# Patient Record
Sex: Male | Born: 1970 | Race: White | Hispanic: No | Marital: Single | Smoking: Current every day smoker
Health system: Southern US, Community
[De-identification: ages and names within clinical notes are randomized; demographics above are authoritative.]

## PROBLEM LIST (undated history)

## (undated) DIAGNOSIS — L0291 Cutaneous abscess, unspecified: Secondary | ICD-10-CM

## (undated) DIAGNOSIS — F329 Major depressive disorder, single episode, unspecified: Secondary | ICD-10-CM

## (undated) DIAGNOSIS — I639 Cerebral infarction, unspecified: Secondary | ICD-10-CM

## (undated) DIAGNOSIS — K219 Gastro-esophageal reflux disease without esophagitis: Secondary | ICD-10-CM

## (undated) DIAGNOSIS — I38 Endocarditis, valve unspecified: Secondary | ICD-10-CM

## (undated) DIAGNOSIS — R739 Hyperglycemia, unspecified: Secondary | ICD-10-CM

## (undated) DIAGNOSIS — F419 Anxiety disorder, unspecified: Secondary | ICD-10-CM

## (undated) DIAGNOSIS — I10 Essential (primary) hypertension: Secondary | ICD-10-CM

## (undated) DIAGNOSIS — F32A Depression, unspecified: Secondary | ICD-10-CM

## (undated) HISTORY — PX: OTHER SURGICAL HISTORY: SHX169

## (undated) HISTORY — PX: DENTAL SURGERY: SHX609

---

## 2016-10-22 DIAGNOSIS — R739 Hyperglycemia, unspecified: Secondary | ICD-10-CM

## 2016-10-22 HISTORY — DX: Hyperglycemia, unspecified: R73.9

## 2016-11-04 ENCOUNTER — Encounter (HOSPITAL_COMMUNITY): Payer: Self-pay

## 2016-11-04 ENCOUNTER — Emergency Department (HOSPITAL_COMMUNITY): Payer: Self-pay

## 2016-11-04 ENCOUNTER — Inpatient Hospital Stay (HOSPITAL_COMMUNITY)
Admission: EM | Admit: 2016-11-04 | Discharge: 2016-11-11 | DRG: 574 | Disposition: A | Discharge status: Home / Self Care | Payer: Self-pay | Attending: Family Medicine | Admitting: Family Medicine

## 2016-11-04 DIAGNOSIS — K219 Gastro-esophageal reflux disease without esophagitis: Secondary | ICD-10-CM | POA: Diagnosis present

## 2016-11-04 DIAGNOSIS — L02413 Cutaneous abscess of right upper limb: Principal | ICD-10-CM | POA: Diagnosis present

## 2016-11-04 DIAGNOSIS — Z5900 Homelessness unspecified: Secondary | ICD-10-CM

## 2016-11-04 DIAGNOSIS — L0390 Cellulitis, unspecified: Secondary | ICD-10-CM | POA: Diagnosis present

## 2016-11-04 DIAGNOSIS — L02419 Cutaneous abscess of limb, unspecified: Secondary | ICD-10-CM | POA: Diagnosis present

## 2016-11-04 DIAGNOSIS — R739 Hyperglycemia, unspecified: Secondary | ICD-10-CM

## 2016-11-04 DIAGNOSIS — N179 Acute kidney failure, unspecified: Secondary | ICD-10-CM | POA: Diagnosis not present

## 2016-11-04 DIAGNOSIS — I10 Essential (primary) hypertension: Secondary | ICD-10-CM | POA: Diagnosis present

## 2016-11-04 DIAGNOSIS — F1910 Other psychoactive substance abuse, uncomplicated: Secondary | ICD-10-CM | POA: Diagnosis present

## 2016-11-04 DIAGNOSIS — D72829 Elevated white blood cell count, unspecified: Secondary | ICD-10-CM

## 2016-11-04 DIAGNOSIS — R7303 Prediabetes: Secondary | ICD-10-CM | POA: Diagnosis present

## 2016-11-04 DIAGNOSIS — F17210 Nicotine dependence, cigarettes, uncomplicated: Secondary | ICD-10-CM | POA: Diagnosis present

## 2016-11-04 DIAGNOSIS — T79A0XA Compartment syndrome, unspecified, initial encounter: Secondary | ICD-10-CM | POA: Diagnosis present

## 2016-11-04 DIAGNOSIS — T368X5A Adverse effect of other systemic antibiotics, initial encounter: Secondary | ICD-10-CM | POA: Diagnosis not present

## 2016-11-04 DIAGNOSIS — L02414 Cutaneous abscess of left upper limb: Secondary | ICD-10-CM | POA: Diagnosis present

## 2016-11-04 DIAGNOSIS — F1190 Opioid use, unspecified, uncomplicated: Secondary | ICD-10-CM | POA: Diagnosis present

## 2016-11-04 DIAGNOSIS — B999 Unspecified infectious disease: Secondary | ICD-10-CM

## 2016-11-04 HISTORY — DX: Hyperglycemia, unspecified: R73.9

## 2016-11-04 HISTORY — DX: Gastro-esophageal reflux disease without esophagitis: K21.9

## 2016-11-04 HISTORY — DX: Essential (primary) hypertension: I10

## 2016-11-04 LAB — URINALYSIS, ROUTINE W REFLEX MICROSCOPIC
GLUCOSE, UA: NEGATIVE mg/dL
HGB URINE DIPSTICK: NEGATIVE
Ketones, ur: 15 mg/dL — AB
Leukocytes, UA: NEGATIVE
Nitrite: NEGATIVE
PROTEIN: NEGATIVE mg/dL
Specific Gravity, Urine: 1.027 (ref 1.005–1.030)
pH: 6 (ref 5.0–8.0)

## 2016-11-04 LAB — COMPREHENSIVE METABOLIC PANEL
ALBUMIN: 3.4 g/dL — AB (ref 3.5–5.0)
ALK PHOS: 64 U/L (ref 38–126)
ALT: 12 U/L — AB (ref 17–63)
AST: 19 U/L (ref 15–41)
Anion gap: 10 (ref 5–15)
BILIRUBIN TOTAL: 0.8 mg/dL (ref 0.3–1.2)
BUN: 11 mg/dL (ref 6–20)
CALCIUM: 8.5 mg/dL — AB (ref 8.9–10.3)
CO2: 24 mmol/L (ref 22–32)
CREATININE: 0.93 mg/dL (ref 0.61–1.24)
Chloride: 97 mmol/L — ABNORMAL LOW (ref 101–111)
GFR calc Af Amer: >60 mL/min (ref >60)
GLUCOSE: 137 mg/dL — AB (ref 65–99)
POTASSIUM: 3.7 mmol/L (ref 3.5–5.1)
Sodium: 131 mmol/L — ABNORMAL LOW (ref 135–145)
TOTAL PROTEIN: 6.9 g/dL (ref 6.5–8.1)

## 2016-11-04 LAB — CBC WITH DIFFERENTIAL/PLATELET
BASOS ABS: 0.1 10*3/uL (ref 0.0–0.1)
BASOS PCT: 0 %
EOS PCT: 0 %
Eosinophils Absolute: 0.1 10*3/uL (ref 0.0–0.7)
HEMATOCRIT: 34.4 % — AB (ref 39.0–52.0)
Hemoglobin: 11.2 g/dL — ABNORMAL LOW (ref 13.0–17.0)
Lymphocytes Relative: 10 %
Lymphs Abs: 2.3 10*3/uL (ref 0.7–4.0)
MCH: 27.7 pg (ref 26.0–34.0)
MCHC: 32.6 g/dL (ref 30.0–36.0)
MCV: 85.1 fL (ref 78.0–100.0)
MONO ABS: 1.4 10*3/uL — AB (ref 0.1–1.0)
MONOS PCT: 6 %
NEUTROS ABS: 18.3 10*3/uL — AB (ref 1.7–7.7)
Neutrophils Relative %: 84 %
PLATELETS: 475 10*3/uL — AB (ref 150–400)
RBC: 4.04 MIL/uL — ABNORMAL LOW (ref 4.22–5.81)
RDW: 13.3 % (ref 11.5–15.5)
WBC: 22.1 10*3/uL — ABNORMAL HIGH (ref 4.0–10.5)

## 2016-11-04 LAB — I-STAT CG4 LACTIC ACID, ED: LACTIC ACID, VENOUS: 1.03 mmol/L (ref 0.5–1.9)

## 2016-11-04 MED ORDER — ACETAMINOPHEN 325 MG PO TABS
650.0000 mg | ORAL_TABLET | Freq: Once | ORAL | Status: AC | PRN
  Administered 2016-11-04: 650 mg via ORAL

## 2016-11-04 MED ORDER — SODIUM CHLORIDE 0.9 % IV BOLUS (SEPSIS)
1000.0000 mL | Freq: Once | INTRAVENOUS | Status: AC
  Administered 2016-11-05: 1000 mL via INTRAVENOUS

## 2016-11-04 MED ORDER — ACETAMINOPHEN 325 MG PO TABS
ORAL_TABLET | ORAL | Status: AC
  Filled 2016-11-04: qty 2

## 2016-11-04 NOTE — ED Triage Notes (Signed)
Onset 2 days abscess in right AC after injecting heroin.  Upper right arm to right wrist swollen.  Redness noted to upper mid right arm to below elbow.  No drainage noted.  Pt also has abscess from IV heroin on left AC that is draining clear fluid from small yellow area, swollen, and red.

## 2016-11-05 ENCOUNTER — Encounter (HOSPITAL_COMMUNITY)
Admission: EM | Disposition: A | Discharge status: Home / Self Care | Payer: Self-pay | Source: Home / Self Care | Attending: Family Medicine

## 2016-11-05 ENCOUNTER — Inpatient Hospital Stay (HOSPITAL_COMMUNITY): Payer: Self-pay | Admitting: Anesthesiology

## 2016-11-05 ENCOUNTER — Emergency Department (HOSPITAL_COMMUNITY): Payer: Self-pay

## 2016-11-05 ENCOUNTER — Encounter (HOSPITAL_COMMUNITY): Payer: Self-pay | Admitting: Family Medicine

## 2016-11-05 DIAGNOSIS — I10 Essential (primary) hypertension: Secondary | ICD-10-CM

## 2016-11-05 DIAGNOSIS — L02419 Cutaneous abscess of limb, unspecified: Secondary | ICD-10-CM

## 2016-11-05 DIAGNOSIS — L0390 Cellulitis, unspecified: Secondary | ICD-10-CM

## 2016-11-05 DIAGNOSIS — Z5900 Homelessness unspecified: Secondary | ICD-10-CM

## 2016-11-05 DIAGNOSIS — R739 Hyperglycemia, unspecified: Secondary | ICD-10-CM

## 2016-11-05 DIAGNOSIS — F1990 Other psychoactive substance use, unspecified, uncomplicated: Secondary | ICD-10-CM

## 2016-11-05 DIAGNOSIS — D72829 Elevated white blood cell count, unspecified: Secondary | ICD-10-CM

## 2016-11-05 HISTORY — PX: I & D EXTREMITY: SHX5045

## 2016-11-05 LAB — RAPID URINE DRUG SCREEN, HOSP PERFORMED
Amphetamines: POSITIVE — AB
Barbiturates: NOT DETECTED
Benzodiazepines: POSITIVE — AB
Cocaine: NOT DETECTED
Opiates: POSITIVE — AB
Tetrahydrocannabinol: NOT DETECTED

## 2016-11-05 LAB — C-REACTIVE PROTEIN: CRP: 7.2 mg/dL — AB (ref <1.0)

## 2016-11-05 LAB — SEDIMENTATION RATE: SED RATE: 57 mm/h — AB (ref 0–16)

## 2016-11-05 LAB — ETHANOL: Alcohol, Ethyl (B): <5 mg/dL (ref <5)

## 2016-11-05 SURGERY — IRRIGATION AND DEBRIDEMENT EXTREMITY
Anesthesia: General | Site: Arm Upper | Laterality: Bilateral

## 2016-11-05 MED ORDER — SODIUM CHLORIDE 0.9% FLUSH: 3.0000 mL | INTRAVENOUS | Status: DC | PRN

## 2016-11-05 MED ORDER — HYDROMORPHONE HCL 2 MG/ML IJ SOLN
1.0000 mg | Freq: Once | INTRAMUSCULAR | Status: AC
  Administered 2016-11-05: 1 mg via INTRAVENOUS
  Filled 2016-11-05: qty 1

## 2016-11-05 MED ORDER — KETOROLAC TROMETHAMINE 30 MG/ML IJ SOLN
INTRAMUSCULAR | Status: AC
  Filled 2016-11-05: qty 1

## 2016-11-05 MED ORDER — MIDAZOLAM HCL 2 MG/2ML IJ SOLN
INTRAMUSCULAR | Status: AC
  Filled 2016-11-05: qty 2

## 2016-11-05 MED ORDER — FOLIC ACID 1 MG PO TABS
1.0000 mg | ORAL_TABLET | Freq: Every day | ORAL | Status: DC
  Administered 2016-11-05 – 2016-11-11 (×6): 1 mg via ORAL
  Filled 2016-11-05 (×6): qty 1

## 2016-11-05 MED ORDER — PROPOFOL 10 MG/ML IV BOLUS
INTRAVENOUS | Status: AC
  Filled 2016-11-05: qty 20

## 2016-11-05 MED ORDER — HYDROMORPHONE HCL 1 MG/ML IJ SOLN
0.2500 mg | INTRAMUSCULAR | Status: DC | PRN
  Administered 2016-11-05 (×3): 0.5 mg via INTRAVENOUS

## 2016-11-05 MED ORDER — KETOROLAC TROMETHAMINE 30 MG/ML IJ SOLN
30.0000 mg | Freq: Once | INTRAMUSCULAR | Status: AC | PRN
  Administered 2016-11-05: 30 mg via INTRAVENOUS

## 2016-11-05 MED ORDER — LACTATED RINGERS IV SOLN
INTRAVENOUS | Status: DC | PRN
  Administered 2016-11-05: 07:00:00 via INTRAVENOUS

## 2016-11-05 MED ORDER — THIAMINE HCL 100 MG/ML IJ SOLN: 100.0000 mg | Freq: Every day | INTRAMUSCULAR | Status: DC

## 2016-11-05 MED ORDER — SODIUM CHLORIDE 0.9 % IV SOLN: 250.0000 mL | INTRAVENOUS | Status: DC | PRN

## 2016-11-05 MED ORDER — SODIUM CHLORIDE 0.9 % IR SOLN
Status: DC | PRN
  Administered 2016-11-05: 3000 mL

## 2016-11-05 MED ORDER — MIDAZOLAM HCL 2 MG/2ML IJ SOLN
INTRAMUSCULAR | Status: DC | PRN
  Administered 2016-11-05: 2 mg via INTRAVENOUS

## 2016-11-05 MED ORDER — HYDROMORPHONE HCL 2 MG/ML IJ SOLN
INTRAMUSCULAR | Status: AC
  Administered 2016-11-05: 0.5 mg
  Filled 2016-11-05: qty 1

## 2016-11-05 MED ORDER — ONDANSETRON HCL 4 MG/2ML IJ SOLN
INTRAMUSCULAR | Status: AC
  Filled 2016-11-05: qty 2

## 2016-11-05 MED ORDER — OXYCODONE HCL 5 MG PO TABS
5.0000 mg | ORAL_TABLET | ORAL | Status: DC | PRN
  Administered 2016-11-05 – 2016-11-11 (×31): 10 mg via ORAL
  Filled 2016-11-05 (×32): qty 2

## 2016-11-05 MED ORDER — LIDOCAINE HCL (CARDIAC) 20 MG/ML IV SOLN
INTRAVENOUS | Status: DC | PRN
  Administered 2016-11-05: 60 mg via INTRAVENOUS

## 2016-11-05 MED ORDER — SODIUM CHLORIDE 0.9 % IV BOLUS (SEPSIS)
1000.0000 mL | Freq: Once | INTRAVENOUS | Status: AC
  Administered 2016-11-05: 1000 mL via INTRAVENOUS

## 2016-11-05 MED ORDER — FENTANYL CITRATE (PF) 100 MCG/2ML IJ SOLN
INTRAMUSCULAR | Status: AC
  Filled 2016-11-05: qty 4

## 2016-11-05 MED ORDER — VANCOMYCIN HCL IN DEXTROSE 1-5 GM/200ML-% IV SOLN
1000.0000 mg | Freq: Once | INTRAVENOUS | Status: AC
Start: 2016-11-05 — End: 2016-11-05
  Administered 2016-11-05: 1000 mg via INTRAVENOUS
  Filled 2016-11-05: qty 200

## 2016-11-05 MED ORDER — SUCCINYLCHOLINE CHLORIDE 20 MG/ML IJ SOLN
INTRAMUSCULAR | Status: DC | PRN
  Administered 2016-11-05: 100 mg via INTRAVENOUS

## 2016-11-05 MED ORDER — ACETAMINOPHEN 650 MG RE SUPP: 650.0000 mg | Freq: Four times a day (QID) | RECTAL | Status: DC | PRN

## 2016-11-05 MED ORDER — HYDRALAZINE HCL 20 MG/ML IJ SOLN
5.0000 mg | INTRAMUSCULAR | Status: DC | PRN
  Administered 2016-11-06: 5 mg via INTRAVENOUS

## 2016-11-05 MED ORDER — LORAZEPAM 2 MG/ML IJ SOLN: 1.0000 mg | Freq: Four times a day (QID) | INTRAMUSCULAR | Status: DC | PRN

## 2016-11-05 MED ORDER — MORPHINE SULFATE (PF) 2 MG/ML IV SOLN
1.0000 mg | INTRAVENOUS | Status: DC | PRN
  Administered 2016-11-06: 2 mg via INTRAVENOUS
  Filled 2016-11-05: qty 1

## 2016-11-05 MED ORDER — VANCOMYCIN HCL IN DEXTROSE 1-5 GM/200ML-% IV SOLN
1000.0000 mg | Freq: Two times a day (BID) | INTRAVENOUS | Status: DC
  Administered 2016-11-05 – 2016-11-08 (×6): 1000 mg via INTRAVENOUS
  Filled 2016-11-05 (×8): qty 200

## 2016-11-05 MED ORDER — HYDROMORPHONE HCL 2 MG/ML IJ SOLN: 1.0000 mg | INTRAMUSCULAR | Status: DC | PRN

## 2016-11-05 MED ORDER — PROPOFOL 10 MG/ML IV BOLUS
INTRAVENOUS | Status: DC | PRN
  Administered 2016-11-05: 190 mg via INTRAVENOUS

## 2016-11-05 MED ORDER — 0.9 % SODIUM CHLORIDE (POUR BTL) OPTIME
TOPICAL | Status: DC | PRN
  Administered 2016-11-05: 1000 mL

## 2016-11-05 MED ORDER — ADULT MULTIVITAMIN W/MINERALS CH
1.0000 | ORAL_TABLET | Freq: Every day | ORAL | Status: DC
  Administered 2016-11-05 – 2016-11-11 (×6): 1 via ORAL
  Filled 2016-11-05 (×6): qty 1

## 2016-11-05 MED ORDER — ONDANSETRON HCL 4 MG/2ML IJ SOLN: 4.0000 mg | Freq: Four times a day (QID) | INTRAMUSCULAR | Status: DC | PRN

## 2016-11-05 MED ORDER — PIPERACILLIN-TAZOBACTAM 3.375 G IVPB 30 MIN
3.3750 g | Freq: Once | INTRAVENOUS | Status: AC
  Administered 2016-11-05: 3.375 g via INTRAVENOUS
  Filled 2016-11-05: qty 50

## 2016-11-05 MED ORDER — FENTANYL CITRATE (PF) 100 MCG/2ML IJ SOLN
INTRAMUSCULAR | Status: DC | PRN
  Administered 2016-11-05 (×4): 50 ug via INTRAVENOUS

## 2016-11-05 MED ORDER — LIDOCAINE-EPINEPHRINE (PF) 2 %-1:200000 IJ SOLN
20.0000 mL | Freq: Once | INTRAMUSCULAR | Status: AC
  Administered 2016-11-05: 20 mL
  Filled 2016-11-05: qty 20

## 2016-11-05 MED ORDER — ONDANSETRON HCL 4 MG/2ML IJ SOLN: 4.0000 mg | Freq: Once | INTRAMUSCULAR | Status: DC

## 2016-11-05 MED ORDER — LORAZEPAM 1 MG PO TABS: 1.0000 mg | ORAL_TABLET | Freq: Four times a day (QID) | ORAL | Status: DC | PRN

## 2016-11-05 MED ORDER — PROMETHAZINE HCL 25 MG/ML IJ SOLN: 6.2500 mg | INTRAMUSCULAR | Status: DC | PRN

## 2016-11-05 MED ORDER — ONDANSETRON HCL 4 MG/2ML IJ SOLN
INTRAMUSCULAR | Status: DC | PRN
  Administered 2016-11-05: 4 mg via INTRAVENOUS

## 2016-11-05 MED ORDER — LIDOCAINE 2% (20 MG/ML) 5 ML SYRINGE
INTRAMUSCULAR | Status: AC
  Filled 2016-11-05: qty 5

## 2016-11-05 MED ORDER — PIPERACILLIN-TAZOBACTAM 3.375 G IVPB
3.3750 g | Freq: Three times a day (TID) | INTRAVENOUS | Status: DC
  Administered 2016-11-05 – 2016-11-11 (×17): 3.375 g via INTRAVENOUS
  Filled 2016-11-05 (×21): qty 50

## 2016-11-05 MED ORDER — IOPAMIDOL (ISOVUE-300) INJECTION 61%
INTRAVENOUS | Status: AC
  Administered 2016-11-05: 75 mL via INTRAVENOUS
  Filled 2016-11-05: qty 75

## 2016-11-05 MED ORDER — ONDANSETRON HCL 4 MG PO TABS: 4.0000 mg | ORAL_TABLET | Freq: Four times a day (QID) | ORAL | Status: DC | PRN

## 2016-11-05 MED ORDER — SODIUM CHLORIDE 0.9% FLUSH
3.0000 mL | Freq: Two times a day (BID) | INTRAVENOUS | Status: DC
  Administered 2016-11-06 – 2016-11-11 (×7): 3 mL via INTRAVENOUS

## 2016-11-05 MED ORDER — ACETAMINOPHEN 325 MG PO TABS
650.0000 mg | ORAL_TABLET | Freq: Four times a day (QID) | ORAL | Status: DC | PRN
  Administered 2016-11-05 – 2016-11-07 (×2): 650 mg via ORAL
  Filled 2016-11-05 (×2): qty 2

## 2016-11-05 MED ORDER — VITAMIN B-1 100 MG PO TABS
100.0000 mg | ORAL_TABLET | Freq: Every day | ORAL | Status: DC
  Administered 2016-11-05 – 2016-11-11 (×6): 100 mg via ORAL
  Filled 2016-11-05 (×7): qty 1

## 2016-11-05 SURGICAL SUPPLY — 44 items
BANDAGE ACE 4X5 VEL STRL LF (GAUZE/BANDAGES/DRESSINGS) ×12 IMPLANT
BNDG CONFORM 2 STRL LF (GAUZE/BANDAGES/DRESSINGS) IMPLANT
BNDG ESMARK 4X9 LF (GAUZE/BANDAGES/DRESSINGS) ×6 IMPLANT
BNDG GAUZE ELAST 4 BULKY (GAUZE/BANDAGES/DRESSINGS) ×12 IMPLANT
CLIP TI LARGE 6 (CLIP) ×3 IMPLANT
CLIP TI MEDIUM 6 (CLIP) ×3 IMPLANT
CORDS BIPOLAR (ELECTRODE) ×3 IMPLANT
CUFF TOURNIQUET SINGLE 18IN (TOURNIQUET CUFF) ×6 IMPLANT
CUFF TOURNIQUET SINGLE 24IN (TOURNIQUET CUFF) IMPLANT
DRAIN PENROSE 1/4X12 LTX STRL (WOUND CARE) ×6 IMPLANT
DRSG ADAPTIC 3X8 NADH LF (GAUZE/BANDAGES/DRESSINGS) ×6 IMPLANT
GAUZE SPONGE 4X4 12PLY STRL (GAUZE/BANDAGES/DRESSINGS) ×12 IMPLANT
GAUZE XEROFORM 1X8 LF (GAUZE/BANDAGES/DRESSINGS) ×6 IMPLANT
GLOVE BIOGEL M 8.0 STRL (GLOVE) ×3 IMPLANT
GLOVE SS BIOGEL STRL SZ 8 (GLOVE) ×1 IMPLANT
GLOVE SUPERSENSE BIOGEL SZ 8 (GLOVE) ×2
GOWN STRL REUS W/ TWL LRG LVL3 (GOWN DISPOSABLE) ×1 IMPLANT
GOWN STRL REUS W/ TWL XL LVL3 (GOWN DISPOSABLE) ×2 IMPLANT
GOWN STRL REUS W/TWL LRG LVL3 (GOWN DISPOSABLE) ×2
GOWN STRL REUS W/TWL XL LVL3 (GOWN DISPOSABLE) ×4
HANDPIECE INTERPULSE COAX TIP (DISPOSABLE)
KIT BASIN OR (CUSTOM PROCEDURE TRAY) ×3 IMPLANT
KIT ROOM TURNOVER OR (KITS) ×3 IMPLANT
MANIFOLD NEPTUNE II (INSTRUMENTS) ×3 IMPLANT
NEEDLE HYPO 25GX1X1/2 BEV (NEEDLE) IMPLANT
NS IRRIG 1000ML POUR BTL (IV SOLUTION) ×3 IMPLANT
PACK ORTHO EXTREMITY (CUSTOM PROCEDURE TRAY) ×3 IMPLANT
PAD ARMBOARD 7.5X6 YLW CONV (MISCELLANEOUS) ×3 IMPLANT
PAD CAST 4YDX4 CTTN HI CHSV (CAST SUPPLIES) ×5 IMPLANT
PADDING CAST COTTON 4X4 STRL (CAST SUPPLIES) ×10
SET CYSTO W/LG BORE CLAMP LF (SET/KITS/TRAYS/PACK) ×3 IMPLANT
SET HNDPC FAN SPRY TIP SCT (DISPOSABLE) IMPLANT
SPONGE LAP 18X18 X RAY DECT (DISPOSABLE) ×3 IMPLANT
SPONGE LAP 4X18 X RAY DECT (DISPOSABLE) ×3 IMPLANT
SUT PROLENE 3 0 PS 1 (SUTURE) ×6 IMPLANT
SWAB CULTURE ESWAB REG 1ML (MISCELLANEOUS) ×6 IMPLANT
SYR CONTROL 10ML LL (SYRINGE) IMPLANT
TOWEL OR 17X24 6PK STRL BLUE (TOWEL DISPOSABLE) ×3 IMPLANT
TOWEL OR 17X26 10 PK STRL BLUE (TOWEL DISPOSABLE) ×3 IMPLANT
TUBE ANAEROBIC SPECIMEN COL (MISCELLANEOUS) ×6 IMPLANT
TUBE CONNECTING 12'X1/4 (SUCTIONS) ×1
TUBE CONNECTING 12X1/4 (SUCTIONS) ×2 IMPLANT
WATER STERILE IRR 1000ML POUR (IV SOLUTION) ×3 IMPLANT
YANKAUER SUCT BULB TIP NO VENT (SUCTIONS) ×3 IMPLANT

## 2016-11-05 NOTE — ED Notes (Signed)
Pt O2 sat at 85% on Ra. Placed pt on 2L [place] now sat at 95%

## 2016-11-05 NOTE — ED Provider Notes (Signed)
[facility]-EMERGENCY DEPT Provider Note   CSN: [id number] Arrival date & time: 11/04/16  1910  History   Chief Complaint Chief Complaint  Patient presents with  . Abscess    HPI Jesus Watkins is a 45 y.o. male.  HPI   Patient has PMH of heroine user, uses almost daily for abscesses to bilateral AC's. He states that the left abscess has been therefore a few days but is open and draining. The abscess to his right [facility] started yesterday and has been rapidly worsening. He is having significant pain and swelling. He last used 20 hours ago, and reports having withdrawal symptoms. He has not had much to eat of drink today and has been nauseous and feeling poorly. Denies being aware of fever at home, 101.6 in triage, with a pulse of 110. Pt appears sick and uncomfortable, vital signs have improved after Tylenol.  Pt states he believes it is the water source that caused his abscesses.  Past Medical History:  Diagnosis Date  . Acid reflux     Patient Active Problem List   Diagnosis Date Noted  . Abscess of antecubital fossa 11/05/2016    History reviewed. No pertinent surgical history.    Home Medications    Prior to Admission medications   Not on File    Family History History reviewed. No pertinent family history.  Social History Social History  Substance Use Topics  . Smoking status: Current Every Day Smoker    Packs/day: 0.50    Types: Cigarettes  . Smokeless tobacco: Never Used  . Alcohol use No     Allergies   Patient has no known allergies.   Review of Systems Review of Systems  Review of Systems All other systems negative except as documented in the HPI. All pertinent positives and negatives as reviewed in the HPI.  Physical Exam Updated Vital Signs BP 145/72   Pulse 92   Temp 98.1 F (36.7 C) (Oral)   Resp 20   SpO2 100%   Physical Exam  Constitutional: He appears well-developed and well-nourished. He appears distressed (pain).  HENT:  Head:  Normocephalic and atraumatic.  Nose: Nose normal.  Mouth/Throat: Uvula is midline, oropharynx is clear and moist and mucous membranes are normal.  Dry mucous membranes  Eyes: Pupils are equal, round, and reactive to light.  Neck: Normal range of motion. Neck supple.  Cardiovascular: Normal rate and regular rhythm.   Pulmonary/Chest: Effort normal.  Abdominal: Soft.  No signs of abdominal distention  Musculoskeletal:  No LE swelling  Neurological: He is alert.  Acting at baseline  Skin: Skin is warm and dry. No rash noted.  Left arm- 3 x 2 cm abscess to the antecubital fossa that is open and draining. Wound appears loculated. No associated cellulitis.  Right arm.- large amount of swelling to upper and lower portions of arm with circumferential cellulitis. Arm is firm, indurated, associated crepitus, very tender to the touch.   Nursing note and vitals reviewed.    ED Treatments / Results  Labs (all labs ordered are listed, but only abnormal results are displayed) Labs Reviewed  COMPREHENSIVE METABOLIC PANEL - Abnormal; Notable for the following:       Result Value   Sodium 131 (*)    Chloride 97 (*)    Glucose, Bld 137 (*)    Calcium 8.5 (*)    Albumin 3.4 (*)    ALT 12 (*)    All other components within normal limits  URINALYSIS, ROUTINE W REFLEX  MICROSCOPIC (NOT AT [facility]) - Abnormal; Notable for the following:    Color, Urine AMBER (*)    Bilirubin Urine SMALL (*)    Ketones, ur 15 (*)    All other components within normal limits  CBC WITH DIFFERENTIAL/PLATELET - Abnormal; Notable for the following:    WBC 22.1 (*)    RBC 4.04 (*)    Hemoglobin 11.2 (*)    HCT 34.4 (*)    Platelets 475 (*)    Neutro Abs 18.3 (*)    Monocytes Absolute 1.4 (*)    All other components within normal limits  SEDIMENTATION RATE - Abnormal; Notable for the following:    Sed Rate 57 (*)    All other components within normal limits  C-REACTIVE PROTEIN - Abnormal; Notable for the following:      CRP 7.2 (*)    All other components within normal limits  CULTURE, BLOOD (ROUTINE X 2)  CULTURE, BLOOD (ROUTINE X 2)  URINE CULTURE  I-STAT CG4 LACTIC ACID, ED  I-STAT CG4 LACTIC ACID, ED    EKG  EKG Interpretation None       Radiology Dg Chest 2 View  Result Date: 11/04/2016 CLINICAL DATA:  Concern for infection. Fever, with bilateral forearm abscesses. Initial encounter. EXAM: CHEST  2 VIEW COMPARISON:  None. FINDINGS: The lungs are well-aerated. Mild peribronchial thickening is noted. There is no evidence of focal opacification, pleural effusion or pneumothorax. The heart is normal in size; the mediastinal contour is within normal limits. No acute osseous abnormalities are seen. There is mild chronic deformity of the left lateral seventh rib. IMPRESSION: Mild peribronchial thickening noted.  Lungs otherwise clear. Electronically Signed   By: Roanna RaiderJeffery  Chang M.D.   On: 11/04/2016 21:23    Procedures Procedures (including critical care time)  Medications Ordered in ED Medications  ondansetron (ZOFRAN) injection 4 mg (4 mg Intravenous Not Given 11/05/16 0241)  HYDROmorphone (DILAUDID) injection 1 mg (not administered)  acetaminophen (TYLENOL) tablet 650 mg (650 mg Oral Given 11/04/16 1940)  sodium chloride 0.9 % bolus 1,000 mL (1,000 mLs Intravenous New Bag/Given 11/05/16 0300)  sodium chloride 0.9 % bolus 1,000 mL (0 mLs Intravenous Stopped 11/05/16 0354)  vancomycin (VANCOCIN) IVPB 1000 mg/200 mL premix (0 mg Intravenous Stopped 11/05/16 0336)  iopamidol (ISOVUE-300) 61 % injection (75 mLs Intravenous Contrast Given 11/05/16 0158)  HYDROmorphone (DILAUDID) injection 1 mg (1 mg Intravenous Given 11/05/16 0254)  lidocaine-EPINEPHrine (XYLOCAINE W/EPI) 2 %-1:200000 (PF) injection 20 mL (20 mLs Other Given 11/05/16 0354)  HYDROmorphone (DILAUDID) injection 1 mg (1 mg Intravenous Given 11/05/16 0411)     Initial Impression / Assessment and Plan / ED Course  I have reviewed  the triage vital signs and the nursing notes.  Pertinent labs & imaging results that were available during my care of the patient were reviewed by me and considered in my medical decision making (see chart for details).  Clinical Course     Patient has an elevated CRP at 7.2, and a SED rate of 57. Blood cultures drawn, neg lactic acid. WBC is 22,000. Patient withdrawing, symptoms of withdrawal and pain improved with IV Dilaudid  CT of right antecub shows large complex and multiloculated abscess at the antecubital fossa approx 5 cm with diffuse soft tissue injury.  Wound I&D in ED, left and right, and pt started on Vancomycin, [facility] Dr. Toniann FailKakrakandy, inpatient, Tele.  5:15 am I spoke with hand Dr. Amanda PeaGramig regarding obtaining consult in the AM to assess  if pt needs tot be taken to OR for a wash out.   Final Clinical Impressions(s) / ED Diagnoses   Final diagnoses:  Infection  Abscess of antecubital fossa  Cellulitis, unspecified cellulitis site    New Prescriptions New Prescriptions   No medications on file     Marlon Peliffany Kyriakos Babler, PA-C 11/05/16 0515    Gilda Creasehristopher J Pollina, MD 11/06/16 [phone]

## 2016-11-05 NOTE — H&P (Signed)
Jesus Watkins is an 45 y.o. male.   Chief Complaint: Bilateral arm infections HPI: Bilateral arm infections  I was to see by the emergency room staff and the hospitalist in regards to the [facility] predicament. The patient has a long-standing history of IV drug abuse but recently went on a binge after a where he did not use.  He is currently homeless. He has quite a bit of psychosocial issues ongoing.  He denies other complaints but states that he is quite painful on both arms. I reviewed his chart and the chart reflects what is gone on since his admission.  Past Medical History:  Diagnosis Date  . Acid reflux     History reviewed. No pertinent surgical history.  History reviewed. No pertinent family history. Social History:  reports that he has been smoking Cigarettes.  He has been smoking about 0.50 packs per day. He has never used smokeless tobacco. He reports that he has current or past drug history, including IV. He reports that he does not drink alcohol.  Allergies: No Known Allergies   (Not in a hospital admission)  Results for orders placed or performed during the hospital encounter of 11/04/16 (from the past 48 hour(s))  Comprehensive metabolic panel     Status: Abnormal   Collection Time: 11/04/16  7:45 PM  Result Value Ref Range   Sodium 131 (L) 135 - 145 mmol/L   Potassium 3.7 3.5 - 5.1 mmol/L   Chloride 97 (L) 101 - 111 mmol/L   CO2 24 22 - 32 mmol/L   Glucose, Bld 137 (H) 65 - 99 mg/dL   BUN 11 6 - 20 mg/dL   Creatinine, Ser 0.93 0.61 - 1.24 mg/dL   Calcium 8.5 (L) 8.9 - 10.3 mg/dL   Total Protein 6.9 6.5 - 8.1 g/dL   Albumin 3.4 (L) 3.5 - 5.0 g/dL   AST 19 15 - 41 U/L   ALT 12 (L) 17 - 63 U/L   Alkaline Phosphatase 64 38 - 126 U/L   Total Bilirubin 0.8 0.3 - 1.2 mg/dL   GFR calc non Af Amer >60 >60 mL/min   GFR calc Af Amer >60 >60 mL/min    Comment: (NOTE) The eGFR has been calculated using the CKD EPI equation. This calculation has not been validated in all  clinical situations. eGFR's persistently <60 mL/min signify possible Chronic Kidney Disease.    Anion gap 10 5 - 15  CBC with Differential     Status: Abnormal   Collection Time: 11/04/16  7:45 PM  Result Value Ref Range   WBC 22.1 (H) 4.0 - 10.5 K/uL   RBC 4.04 (L) 4.22 - 5.81 MIL/uL   Hemoglobin 11.2 (L) 13.0 - 17.0 g/dL   HCT 34.4 (L) 39.0 - 52.0 %   MCV 85.1 78.0 - 100.0 fL   MCH 27.7 26.0 - 34.0 pg   MCHC 32.6 30.0 - 36.0 g/dL   RDW 13.3 11.5 - 15.5 %   Platelets 475 (H) 150 - 400 K/uL   Neutrophils Relative % 84 %   Neutro Abs 18.3 (H) 1.7 - 7.7 K/uL   Lymphocytes Relative 10 %   Lymphs Abs 2.3 0.7 - 4.0 K/uL   Monocytes Relative 6 %   Monocytes Absolute 1.4 (H) 0.1 - 1.0 K/uL   Eosinophils Relative 0 %   Eosinophils Absolute 0.1 0.0 - 0.7 K/uL   Basophils Relative 0 %   Basophils Absolute 0.1 0.0 - 0.1 K/uL  Urinalysis, Routine w reflex  microscopic     Status: Abnormal   Collection Time: 11/04/16  7:47 PM  Result Value Ref Range   Color, Urine AMBER (A) YELLOW    Comment: BIOCHEMICALS MAY BE AFFECTED BY COLOR   APPearance CLEAR CLEAR   Specific Gravity, Urine 1.027 1.005 - 1.030   pH 6.0 5.0 - 8.0   Glucose, UA NEGATIVE NEGATIVE mg/dL   Hgb urine dipstick NEGATIVE NEGATIVE   Bilirubin Urine SMALL (A) NEGATIVE   Ketones, ur 15 (A) NEGATIVE mg/dL   Protein, ur NEGATIVE NEGATIVE mg/dL   Nitrite NEGATIVE NEGATIVE   Leukocytes, UA NEGATIVE NEGATIVE    Comment: MICROSCOPIC NOT DONE ON URINES WITH NEGATIVE PROTEIN, BLOOD, LEUKOCYTES, NITRITE, OR GLUCOSE <1000 mg/dL.  I-Stat CG4 Lactic Acid, ED     Status: None   Collection Time: 11/04/16  8:01 PM  Result Value Ref Range   Lactic Acid, Venous 1.03 0.5 - 1.9 mmol/L  Sedimentation rate     Status: Abnormal   Collection Time: 11/05/16 12:57 AM  Result Value Ref Range   Sed Rate 57 (H) 0 - 16 mm/hr  C-reactive protein     Status: Abnormal   Collection Time: 11/05/16 12:57 AM  Result Value Ref Range   CRP 7.2 (H) <1.0  mg/dL   Dg Chest 2 View  Result Date: 11/04/2016 CLINICAL DATA:  Concern for infection. Fever, with bilateral forearm abscesses. Initial encounter. EXAM: CHEST  2 VIEW COMPARISON:  None. FINDINGS: The lungs are well-aerated. Mild peribronchial thickening is noted. There is no evidence of focal opacification, pleural effusion or pneumothorax. The heart is normal in size; the mediastinal contour is within normal limits. No acute osseous abnormalities are seen. There is mild chronic deformity of the left lateral seventh rib. IMPRESSION: Mild peribronchial thickening noted.  Lungs otherwise clear. Electronically Signed   By: Garald Balding M.D.   On: 11/04/2016 21:23   Ct [place] Right W Contrast  Result Date: 11/05/2016 CLINICAL DATA:  Acute onset of right arm infection. Assess for abscess. EXAM: CT OF THE RIGHT ELBOW WITH CONTRAST TECHNIQUE: Multidetector CT imaging was performed following the standard protocol during bolus administration of intravenous contrast. CONTRAST:  75 mL ISOVUE-300 IOPAMIDOL (ISOVUE-300) INJECTION 61% COMPARISON:  None. FINDINGS: There is a large complex multiloculated abscess at the antecubital fossa, measuring approximately 4.8 x 4.7 x 3.6 cm. Mild diffuse soft tissue edema is noted involving the visualized distal upper arm and proximal forearm. The visualized vasculature is grossly unremarkable, though the abscess obscures evaluation of venous vasculature in this region. Soft tissue edema tracks about the musculature. No acute osseous abnormalities are seen. There is no evidence of osseous erosion. No significant elbow joint effusion is identified. IMPRESSION: 1. Large complex multiloculated abscess at the antecubital fossa, measuring 4.8 x 4.7 x 3.6 cm. 2. Mild diffuse soft tissue edema involving the visualized distal upper arm and proximal forearm. Electronically Signed   By: Garald Balding M.D.   On: 11/05/2016 02:54    Review of Systems  Constitutional: Negative.   Eyes:  Negative.   Cardiovascular: Negative.  Negative for PND.  Gastrointestinal: Negative.   Genitourinary: Negative.   Skin: Negative.     Blood pressure 170/72, pulse 86, temperature 99.9 F (37.7 C), temperature source Oral, resp. rate 20, SpO2 100 %. Physical Exam  Bilateral upper stem examination reveals bilateral hair 1 tracks about both arms. He has ascending erythema. He has set to 3 mm marks on the right arm where an initial debridement occurred  by the emergency room staff.  The a patient still has a fluid aggressing from this area on palpation. It is a infectious type fluid. I discussed with patient these issues. His forearm is tense but he can move the fingers. He does certainly have at risk signs regarding the active infection. His left arm is less impressive with erythema and a localized area approximately 4 cm in diameter that has a abscess feature. The patient and I discussed this at length.  I recommend formal irrigation and debridement.  The patient is alert and oriented in no acute distress. The patient complains of pain in the affected upper extremity.  The patient is noted to have a normal HEENT exam. Lung fields show equal chest expansion and no shortness of breath. Abdomen exam is nontender without distention. Lower extremity examination does not show any fracture dislocation or blood clot symptoms. Pelvis is stable and the neck and back are stable and nontender.  He is alert oriented and I discussed with him the necessary and that concerning issues. Assessment/Plan Bilateral abscesses right upper extremities in the antecubital fossa region  IV drug abuse  Homeless psychosocial situation  I discussed all issues with the patient. I been task to evaluate the arms after the initial irrigation debridement type procedure.  I discussed with patient that unfortunately I do not feel that we have a good path going forward and that he needs a surgical idea both arms. The  right arm is especially at risk. He has tight forearm architecture and has ascending erythema. I would recommend a formal irrigation debridement evaluation of his antecubital fossa venous system and fasciotomy repair reconstruction. I discussed with him our experience with IV heroin abscesses and the issues as well as challenges involved. With this amount will go to the OR soon as humanly possible  We are planning surgery for your upper extremity. The risk and benefits of surgery to include risk of bleeding, infection, anesthesia,  damage to normal structures and failure of the surgery to accomplish its intended goals of relieving symptoms and restoring function have been discussed in detail. With this in mind we plan to proceed. I have specifically discussed with the patient the pre-and postoperative regime and the dos and don'ts and risk and benefits in great detail. Risk and benefits of surgery also include risk of dystrophy(CRPS), chronic nerve pain, failure of the healing process to go onto completion and other inherent risks of surgery The relavent the pathophysiology of the disease/injury process, as well as the alternatives for treatment and postoperative course of action has been discussed in great detail with the patient who desires to proceed.  We will do everything in our power to help you (the patient) restore function to the upper extremity. It is a pleasure to see this patient today.  Paulene Floor, MD 11/05/2016, 6:00 AM

## 2016-11-05 NOTE — Anesthesia Preprocedure Evaluation (Signed)
Anesthesia Evaluation  Patient identified by MRN, date of birth, ID band Patient awake    Reviewed: Allergy & Precautions, NPO status , Patient's Chart, lab work & pertinent test results  Airway Mallampati: II  TM Distance: >3 FB Neck ROM: Full    Dental no notable dental hx.    Pulmonary neg pulmonary ROS, Current Smoker,    Pulmonary exam normal breath sounds clear to auscultation       Cardiovascular negative cardio ROS Normal cardiovascular exam Rhythm:Regular Rate:Normal     Neuro/Psych negative neurological ROS  negative psych ROS   GI/Hepatic negative GI ROS, (+)     substance abuse  IV drug use,   Endo/Other  negative endocrine ROS  Renal/GU negative Renal ROS  negative genitourinary   Musculoskeletal negative musculoskeletal ROS (+)   Abdominal   Peds negative pediatric ROS (+)  Hematology negative hematology ROS (+)   Anesthesia Other Findings   Reproductive/Obstetrics negative OB ROS                             Anesthesia Physical Anesthesia Plan  ASA: III  Anesthesia Plan: General   Post-op Pain Management:    Induction: Intravenous  Airway Management Planned: Oral ETT  Additional Equipment:   Intra-op Plan:   Post-operative Plan: Extubation in OR  Informed Consent: I have reviewed the patients History and Physical, chart, labs and discussed the procedure including the risks, benefits and alternatives for the proposed anesthesia with the patient or authorized representative who has indicated his/her understanding and acceptance.   Dental advisory given  Plan Discussed with: CRNA and Surgeon  Anesthesia Plan Comments:         Anesthesia Quick Evaluation

## 2016-11-05 NOTE — Op Note (Signed)
NAME:  Jesus Watkins, Jesus Watkins                ACCOUNT NO.:  [id number]  MEDICAL RECORD NO.:  [id number]  LOCATION:  [facility]                         FACILITY:  [facility]  PHYSICIAN:  Dionne AnoWilliam M. 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 This patient also has a similar process in the left upper extremity.  I have counseled him in regard to risks and benefits of surgery and discussed with him that I would recommend surgical intervention promptly.  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 This patient also has a similar process in the left upper extremity.  I have counseled him in regard to risks and benefits of surgery and discussed with him that I would recommend surgical intervention promptly.  PROCEDURE: 1. Right arm irrigation, debridement, and evacuation of a deep     abscess. 2. Resection portions of basilic and cephalic vein secondary to clot     and infiltrative heroin substance. 3. Fasciotomy, upper arm and forearm. 4. Brachial artery and median nerve dissection (median nerve     neurolysis extensive and brachial artery exploration). 5. Left elbow I and D, deep abscess, this was irrigation and     debridement of skin, subcutaneous tissue, muscle, vessel, and     peritendinous tissue, as well as tendinous tissue. 6. Fasciotomy, left forearm and left upper arm. 7. Tenolysis, tenosynovectomy biceps tendon with infiltrative     infectious process around the tendon requiring a tenolysis. 8. Proximal median nerve release with  lacertus fibrosus resection.  SURGEON:  Dionne AnoWilliam M. Amanda PeaGramig, M.D.  ASSISTANT:  Karie ChimeraBrian Buchanan, PA-C.  COMPLICATIONS:  None.  ANESTHESIA:  General.  TOURNIQUET TIME:  Less than an hour on the right, less than an hour on the left.  DRAINS:  Multiple Penrose drains were placed with wounds left open at the conclusion of the surgery.  INDICATIONS:  This is a 45 year old male with significant and severe psychosocial issues.  He is currently homeless.  He is on heroin binge and has significant infectious findings.  I have discussed with him the risks and benefits, do's and don'ts, timeframe, duration of recovery. With all issues in mind, he desires to proceed.  OPERATION IN DETAIL:  The patient was seen by myself and Anesthesia. Taken to the operative theater and underwent smooth induction of general anesthesia.  I counseled him extensively preoperatively in regard to my concerns and his significant challenges.  Under general anesthetic, time-out was called.  Arm was prepped and draped in usual sterile fashion with Betadine scrub and paint.  Once this was completed, the right arm was addressed first.  Tourniquet was insufflated without exsanguination.  A modified incision was made.  I made a zigzag incision being careful not to cross the elbow crease at the right angle.  After this was completed, I then performed an  evacuation of deep abscess, which was I and D of a deep abscess without difficulty.  The patient tolerated this well.  This included skin, subcutaneous tissue, muscle, tendon, paratenon, and associated structures.  Following this, I then very carefully and cautiously performed a proximal median nerve release, this was a median nerve release and neurolysis releasing the lacertus fibrosus.  I then dissected the cephalic and basilic veins out, which were completely clotted with heroin debris.  I had to use vessel clamps and clamp off the ends and resect the necrotic  devitalized portions.  Following resection of this venous architecture, I turned attention toward the brachial artery.  I dissected this to make sure it was intact and without infiltrative process.  The patient tolerated this well.  Following this, I performed fasciotomy of the volar forearm and the upper arm.  The volar forearm and upper arm underwent a fasciotomy. Muscles were tense, but pink.  There was no necrotic features about the fascia.  The patient tolerated this well.  I then placed 3 L through and through the area.  The patient tolerated this nicely.  The biceps tendon was intact and underwent a brief tenolysis tenosynovectomy.  Following this, I then left the wound wide open.  Multiple Penrose drains were placed.  Soft bandage followed by long-arm splint was applied.  Once this was complete, we turned attention towards the left upper extremity where a modified Brunner zigzag incision was made similar to the right side.  Dissection was carried down.  The deep abscess was evacuated and this included skin, subcutaneous tissue, muscle, tendon, and paratenon tissue.  Cultures were taken on the left side as they were on the right side for standard aerobic and anaerobic cultures.  I should note that the patient had vancomycin preoperatively.  Following this, I then performed fasciotomy of the forearm and upper arm with sliding technique.  Muscle was intact.  Muscle was pink.  Following this, I then performed antebrachial venous architecture and clamped off portions of the basilic vein, which were reddened with heroin infiltrate.  Following resection of the vessel, we then performed a median nerve release with lacertus fibrosus release and evaluation of the median nerve, which looked excellent.  Following this, we irrigated with 3 L of saline.  Following this, we tacked and closed with 2 Prolenes.  The patient tolerated this well. There were no complicating features.  All  sponge, needle, and instrument counts were reported as correct.  Dressing was placed soft on the left side.  He will be admitted by Medicine.  I would recommend vancomycin and Zosyn.  I have discussed with him the relevant issues, do's and don'ts, etc.  He will go back to the operating room on Thursday and he will notify us should any problems occur.  These notes have been discussed.  This is going to be a very significant challenge for this gentleman both on the physical and emotional forefront and we let him know this open and honestly.     Dionne AnoWilliam M. Amanda PeaGramig, M.D.     [facility]WMG/MEDQ  D:  11/05/2016  T:  11/05/2016  Job:  [id number]

## 2016-11-05 NOTE — Op Note (Signed)
Op ZOXW#[id number]#[id number] Amanda PeaGramig Md

## 2016-11-05 NOTE — Anesthesia Preprocedure Evaluation (Signed)
Anesthesia Evaluation  Patient identified by MRN, date of birth, ID band Patient awake    Airway Mallampati: I       Dental   Front teeth intact. Several molars missing. Dental advisory given. :   Pulmonary Current Smoker,    Pulmonary exam normal        Cardiovascular Normal cardiovascular exam     Neuro/Psych    GI/Hepatic GERD  ,Takes baking soda.   Endo/Other    Renal/GU      Musculoskeletal   Abdominal   Peds  Hematology   Anesthesia Other Findings   Reproductive/Obstetrics                             Anesthesia Physical Anesthesia Plan  ASA: II and emergent  Anesthesia Plan: General   Post-op Pain Management:    Induction: Intravenous, Rapid sequence and Cricoid pressure planned  Airway Management Planned: Oral ETT  Additional Equipment:   Intra-op Plan:   Post-operative Plan: Extubation in OR  Informed Consent:   Dental advisory given  Plan Discussed with: CRNA, Anesthesiologist and Surgeon  Anesthesia Plan Comments:         Anesthesia Quick Evaluation

## 2016-11-05 NOTE — Anesthesia Postprocedure Evaluation (Signed)
Anesthesia Post Note  Patient: Jesus Watkins  Procedure(s) Performed: Procedure(s) (LRB): IRRIGATION AND DEBRIDEMENT EXTREMITY (Bilateral)  Patient location during evaluation: PACU Anesthesia Type: General Level of consciousness: sedated Pain management: satisfactory to patient Vital Signs Assessment: post-procedure vital signs reviewed and stable Respiratory status: spontaneous breathing Cardiovascular status: stable Anesthetic complications: no    Last Vitals:  Vitals:   11/05/16 0855 11/05/16 0916  BP: (!) 158/76 (!) 156/78  Pulse: 78 83  Resp: 16 20  Temp: 37.5 C 37 C    Last Pain:  Vitals:   11/05/16 0916  TempSrc: Oral  PainSc: 9                  [name] EDWARD

## 2016-11-05 NOTE — Progress Notes (Signed)
Pharmacy Antibiotic Note  Jesus Watkins is a 45 y.o. male admitted on 11/04/2016 with swollen bilateral arm + wrist. Pt was found to have bilateral abscesses in the antecubital region with compartment syndrome in his R arm. He underwent an I&D, fasciotomy, tenolysis and nerve dissection this morning. WBC 22.1, ESR elevated, Tm 101.6, LA wnl, SCr wnl. He received vancomycin 1 g at 0200 today, this will serve as a low vancomycin load.  Vancomycin goal: 10-15 mcg/ml  Plan: -Zosyn 3.375 g IV q8h -Vancomycin 1g/12h -Monitor renal fx, cultures, duration of therapy, obtain VT at Css  Temp (24hrs), Avg:99.4 F (37.4 C), Min:98.1 F (36.7 C), Max:101.6 F (38.7 C)   Recent Labs Lab 11/04/16 1945 11/04/16 2001  WBC 22.1*  --   CREATININE 0.93  --   LATICACIDVEN  --  1.03    CrCl cannot be calculated (Unknown ideal weight.).    No Known Allergies  Antimicrobials this admission: 11/15 vancomycin > 11/15 zosyn >  Dose adjustments this admission: NA  Microbiology results: 11/15 urine cx: 11/14 blood cx: 11/15 abscess cx:   Thank you for allowing pharmacy to be a part of this patient's care.    Alison Masters, PharmD, BCPS Clinical Pharmacist 11/05/2016 10:23 AM   

## 2016-11-05 NOTE — H&P (Addendum)
History and Physical    Jesus KinScott Nier BJY:[id number] DOB: 07/13/1971 DOA: 11/04/2016  PCP: No PCP Per Patient Patient coming from: homeless  Chief Complaint: bilat arm pain w/ draining L arm wound  HPI: Jesus Watkins is a 45 y.o. male with medical history significant of GERD, and IVDA.   Level 5 caveat applies. Pt continues to be very sleepy postoperatively and unable to provide great details surounding his current condition. History obtained  From pts chart from EDP and orthopedics notes, and on a limited basis by pt. L arm pain. Constant and getting worse. Started over days ago. Developed right arm pain and swelling approximately one day ago. Left arm developed a boil which then opened up and has been using. Right arm abscess is not opened and drained yet. Patient endorses IV drug use, heroin. Last use was approximately 36 hours ago. Does not use sterile technique per patient. At time of my examination patient denied any recent chest pain, palpitations, shortness of breath, abdominal pain, diarrhea, vomiting, dysuria, frequency, neck stiffness, headache, fevers.    ED Course: started on Vanc/Zosyn and taken to OR by Dr. Amanda PeaGramig for washout  Review of Systems: As per HPI otherwise 10 point review of systems negative.   Ambulatory Status:no restrictions at baseline  Past Medical History:  Diagnosis Date  . Acid reflux     Past Surgical History:  Procedure Laterality Date  . bilateral arm abscesses      Social History   Social History  . Marital status: Single    Spouse name: N/A  . Number of children: N/A  . Years of education: N/A   Occupational History  . Not on file.   Social History Main Topics  . Smoking status: Current Every Day Smoker    Packs/day: 0.50    Types: Cigarettes  . Smokeless tobacco: Never Used  . Alcohol use No  . Drug use: Unknown    Types: IV     Comment: HEROIN, LAST USED 11-02-16  . Sexual activity: No   Other Topics Concern  . Not on file    Social History Narrative  . No narrative on file    No Known Allergies  Family History  Problem Relation Age of Onset  . Family history unknown: Yes    Prior to Admission medications   Not on File    Physical Exam: Vitals:   11/05/16 0845 11/05/16 0855 11/05/16 0916 11/05/16 1251  BP: (!) 155/75 (!) 158/76 (!) 156/78   Pulse: 80 78 83   Resp: 14 16 20    Temp:  99.5 F (37.5 C) 98.6 F (37 C)   TempSrc:   Oral   SpO2: 100% 100% 99%   Weight:    72.3 kg (159 lb 6.3 oz)  Height:    5\' 8"  (1.727 m)     General:  Appears calm and comfortable Eyes:  PERRL, EOMI, normal lids, iris ENT:  grossly normal hearing, lips & tongue, mmm Neck:  no LAD, masses or thyromegaly Cardiovascular:  RRR, faint heart sounds, II/VI systolic murmur. No LE edema.  Respiratory:  CTA bilaterally, no w/r/r. Normal respiratory effort. Abdomen:  soft, ntnd, NABS Skin:  no rash or induration seen on limited exam. Bilateral upper extremities wrapped with sterile dressings postoperatively. Clean dry and intact. Musculoskeletal: Good use of upper extremities due to bandaging material across the antecubital fossa was, no bony upper maladies, grossly normal tone Psychiatric:  grossly normal mood and affect, speech fluent and appropriate, AOx3 Neurologic:  CN 2-12 grossly intact, moves all extremities in coordinated fashion, sensation intact  Labs on Admission: I have personally reviewed following labs and imaging studies  CBC:  Recent Labs Lab 11/04/16 1945  WBC 22.1*  NEUTROABS 18.3*  HGB 11.2*  HCT 34.4*  MCV 85.1  PLT 475*   Basic Metabolic Panel:  Recent Labs Lab 11/04/16 1945  NA 131*  K 3.7  CL 97*  CO2 24  GLUCOSE 137*  BUN 11  CREATININE 0.93  CALCIUM 8.5*   GFR: Estimated Creatinine Clearance: 98.1 mL/min (by C-G formula based on SCr of 0.93 mg/dL). Liver Function Tests:  Recent Labs Lab 11/04/16 1945  AST 19  ALT 12*  ALKPHOS 64  BILITOT 0.8  PROT 6.9  ALBUMIN  3.4*   No results for input(s): LIPASE, AMYLASE in the last 168 hours. No results for input(s): AMMONIA in the last 168 hours. Coagulation Profile: No results for input(s): INR, PROTIME in the last 168 hours. Cardiac Enzymes: No results for input(s): CKTOTAL, CKMB, CKMBINDEX, TROPONINI in the last 168 hours. BNP (last 3 results) No results for input(s): PROBNP in the last 8760 hours. HbA1C: No results for input(s): HGBA1C in the last 72 hours. CBG: No results for input(s): GLUCAP in the last 168 hours. Lipid Profile: No results for input(s): CHOL, HDL, LDLCALC, TRIG, CHOLHDL, LDLDIRECT in the last 72 hours. Thyroid Function Tests: No results for input(s): TSH, T4TOTAL, FREET4, T3FREE, THYROIDAB in the last 72 hours. Anemia Panel: No results for input(s): VITAMINB12, FOLATE, FERRITIN, TIBC, IRON, RETICCTPCT in the last 72 hours. Urine analysis:    Component Value Date/Time   COLORURINE AMBER (A) 11/04/2016 1947   APPEARANCEUR CLEAR 11/04/2016 1947   LABSPEC 1.027 11/04/2016 1947   PHURINE 6.0 11/04/2016 1947   GLUCOSEU NEGATIVE 11/04/2016 1947   HGBUR NEGATIVE 11/04/2016 1947   BILIRUBINUR SMALL (A) 11/04/2016 1947   KETONESUR 15 (A) 11/04/2016 1947   PROTEINUR NEGATIVE 11/04/2016 1947   NITRITE NEGATIVE 11/04/2016 1947   LEUKOCYTESUR NEGATIVE 11/04/2016 1947    Creatinine Clearance: Estimated Creatinine Clearance: 98.1 mL/min (by C-G formula based on SCr of 0.93 mg/dL).  Sepsis Labs: @LABRCNTIP (procalcitonin:4,lacticidven:4) )No results found for this or any previous visit (from the past 240 hour(s)).   Radiological Exams on Admission: Dg Chest 2 View  Result Date: 11/04/2016 CLINICAL DATA:  Concern for infection. Fever, with bilateral forearm abscesses. Initial encounter. EXAM: CHEST  2 VIEW COMPARISON:  None. FINDINGS: The lungs are well-aerated. Mild peribronchial thickening is noted. There is no evidence of focal opacification, pleural effusion or pneumothorax. The  heart is normal in size; the mediastinal contour is within normal limits. No acute osseous abnormalities are seen. There is mild chronic deformity of the left lateral seventh rib. IMPRESSION: Mild peribronchial thickening noted.  Lungs otherwise clear. Electronically Signed   By: Roanna Raider M.D.   On: 11/04/2016 21:23   Ct Eblow Right W Contrast  Result Date: 11/05/2016 CLINICAL DATA:  Acute onset of right arm infection. Assess for abscess. EXAM: CT OF THE RIGHT ELBOW WITH CONTRAST TECHNIQUE: Multidetector CT imaging was performed following the standard protocol during bolus administration of intravenous contrast. CONTRAST:  75 mL ISOVUE-300 IOPAMIDOL (ISOVUE-300) INJECTION 61% COMPARISON:  None. FINDINGS: There is a large complex multiloculated abscess at the antecubital fossa, measuring approximately 4.8 x 4.7 x 3.6 cm. Mild diffuse soft tissue edema is noted involving the visualized distal upper arm and proximal forearm. The visualized vasculature is grossly unremarkable, though the abscess obscures evaluation of  venous vasculature in this region. Soft tissue edema tracks about the musculature. No acute osseous abnormalities are seen. There is no evidence of osseous erosion. No significant elbow joint effusion is identified. IMPRESSION: 1. Large complex multiloculated abscess at the antecubital fossa, measuring 4.8 x 4.7 x 3.6 cm. 2. Mild diffuse soft tissue edema involving the visualized distal upper arm and proximal forearm. Electronically Signed   By: Roanna RaiderJeffery  Chang M.D.   On: 11/05/2016 02:54    EKG: Pending  Assessment/Plan Active Problems:   Abscess of antecubital fossa   Hyperglycemia   IVDA (intravenous drug abuse) complicating pregnancy   Leukocytosis   Essential hypertension   Homeless   Cellulitis   Bilat Abscess/Cellulitis: Patient taken to operating room on 11/05/2016 emergently by Dr. Amanda PeaGramig for washout. POD# 0. Continues to be somewhat sleepy from anesthesia - Wound Mgt per  ortho - Dr Amanda PeaGramig - f/u BCX, Wound Cx - HIV, RPR - Continue Vanc/Zosyn - narrow as able   IVDA: endorses binging on IVD w/ daily administration. Heroine appears to be drug of choice. Unknown ETOH intake - UDS - HIV, RPR - pain mgt will need to be monitored closely - CIWA - Echo if BCX +  Hyperglycemia: no h/o DM. 156. Likely secondary to acute stress from infection - CBG monitoring - A1c  HTN: not on any medications. May be elevated secondary to pain - Hydralazine prn  Homeless: - CSW for community resources.     DVT prophylaxis: SCD  Code Status: full  Family Communication: none  Disposition Plan: pending recovery from surgery and continued workup for IVDA and sequelae  Consults called: Ortho - Gramig  Admission status: inpt    MERRELL, DAVID J MD [facility]  If 7PM-7AM, please contact night-coverage [email or web address] Password [email or web address]  11/05/2016, 2:07 PM

## 2016-11-05 NOTE — Anesthesia Procedure Notes (Signed)
Procedure Name: Intubation Date/Time: 11/05/2016 6:46 AM Performed by: Molli HazardGORDON, THERESA M Pre-anesthesia Checklist: Patient identified, Emergency Drugs available, Suction available and Patient being monitored Patient Re-evaluated:Patient Re-evaluated prior to inductionOxygen Delivery Method: Circle system utilized Preoxygenation: Pre-oxygenation with 100% oxygen Intubation Type: IV induction, Cricoid Pressure applied and Rapid sequence Laryngoscope Size: Miller and 2 Grade View: Grade I Tube type: Oral Tube size: 7.5 mm Number of attempts: 1 Airway Equipment and Method: Stylet Placement Confirmation: ETT inserted through vocal cords under direct vision,  positive ETCO2 and breath sounds checked- equal and bilateral Secured at: 22 cm Tube secured with: Tape Dental Injury: Teeth and Oropharynx as per pre-operative assessment

## 2016-11-05 NOTE — Transfer of Care (Signed)
Immediate Anesthesia Transfer of Care Note  Patient: Jesus Watkins  Procedure(s) Performed: Procedure(s): IRRIGATION AND DEBRIDEMENT EXTREMITY (Bilateral)  Patient Location: PACU  Anesthesia Type:General  Level of Consciousness: awake, alert , oriented and patient cooperative  Airway & Oxygen Therapy: Patient Spontanous Breathing and Patient connected to nasal cannula oxygen  Post-op Assessment: Report given to RN, Post -op Vital signs reviewed and stable and Patient moving all extremities  Post vital signs: Reviewed and stable  Last Vitals:  Vitals:   11/05/16 0515 11/05/16 0543  BP: 170/72   Pulse: 86   Resp:    Temp:  37.7 C    Last Pain:  Vitals:   11/05/16 0543  TempSrc: Oral  PainSc:          Complications: No apparent anesthesia complications

## 2016-11-06 ENCOUNTER — Ambulatory Visit: Payer: Self-pay | Admitting: Orthopedic Surgery

## 2016-11-06 ENCOUNTER — Inpatient Hospital Stay (HOSPITAL_COMMUNITY): Payer: Self-pay | Admitting: Anesthesiology

## 2016-11-06 ENCOUNTER — Encounter (HOSPITAL_COMMUNITY)
Admission: EM | Disposition: A | Discharge status: Home / Self Care | Payer: Self-pay | Source: Home / Self Care | Attending: Family Medicine

## 2016-11-06 ENCOUNTER — Encounter (HOSPITAL_COMMUNITY): Payer: Self-pay | Admitting: Orthopedic Surgery

## 2016-11-06 DIAGNOSIS — L03119 Cellulitis of unspecified part of limb: Secondary | ICD-10-CM

## 2016-11-06 HISTORY — PX: I & D EXTREMITY: SHX5045

## 2016-11-06 LAB — BASIC METABOLIC PANEL
ANION GAP: 7 (ref 5–15)
BUN: <5 mg/dL — ABNORMAL LOW (ref 6–20)
CO2: 27 mmol/L (ref 22–32)
Calcium: 7.9 mg/dL — ABNORMAL LOW (ref 8.9–10.3)
Chloride: 102 mmol/L (ref 101–111)
Creatinine, Ser: 0.78 mg/dL (ref 0.61–1.24)
GFR calc Af Amer: >60 mL/min (ref >60)
GFR calc non Af Amer: >60 mL/min (ref >60)
GLUCOSE: 210 mg/dL — AB (ref 65–99)
POTASSIUM: 3.6 mmol/L (ref 3.5–5.1)
Sodium: 136 mmol/L (ref 135–145)

## 2016-11-06 LAB — LIPID PANEL
CHOL/HDL RATIO: 3.7 ratio
Cholesterol: 89 mg/dL (ref 0–200)
HDL: 24 mg/dL — AB (ref >40)
LDL CALC: 50 mg/dL (ref 0–99)
Triglycerides: 76 mg/dL (ref <150)
VLDL: 15 mg/dL (ref 0–40)

## 2016-11-06 LAB — HIV ANTIBODY (ROUTINE TESTING W REFLEX): HIV Screen 4th Generation wRfx: NONREACTIVE

## 2016-11-06 LAB — GLUCOSE, CAPILLARY
GLUCOSE-CAPILLARY: 96 mg/dL (ref 65–99)
GLUCOSE-CAPILLARY: 183 mg/dL — AB (ref 65–99)
Glucose-Capillary: 172 mg/dL — ABNORMAL HIGH (ref 65–99)

## 2016-11-06 LAB — CBC
HEMATOCRIT: 29.5 % — AB (ref 39.0–52.0)
Hemoglobin: 9.4 g/dL — ABNORMAL LOW (ref 13.0–17.0)
MCH: 27.7 pg (ref 26.0–34.0)
MCHC: 31.9 g/dL (ref 30.0–36.0)
MCV: 87 fL (ref 78.0–100.0)
Platelets: 360 10*3/uL (ref 150–400)
RBC: 3.39 MIL/uL — AB (ref 4.22–5.81)
RDW: 14 % (ref 11.5–15.5)
WBC: 13.1 10*3/uL — AB (ref 4.0–10.5)

## 2016-11-06 LAB — URINE CULTURE

## 2016-11-06 LAB — RPR: RPR Ser Ql: NONREACTIVE

## 2016-11-06 SURGERY — IRRIGATION AND DEBRIDEMENT EXTREMITY
Anesthesia: General | Site: Arm Upper | Laterality: Bilateral

## 2016-11-06 MED ORDER — HYDROMORPHONE HCL 1 MG/ML IJ SOLN
0.2500 mg | INTRAMUSCULAR | Status: DC | PRN
  Administered 2016-11-06 (×4): 0.5 mg via INTRAVENOUS

## 2016-11-06 MED ORDER — FENTANYL CITRATE (PF) 100 MCG/2ML IJ SOLN
INTRAMUSCULAR | Status: AC
  Filled 2016-11-06: qty 2

## 2016-11-06 MED ORDER — PROMETHAZINE HCL 25 MG/ML IJ SOLN
6.2500 mg | INTRAMUSCULAR | Status: DC | PRN
  Administered 2016-11-06: 12.5 mg via INTRAVENOUS

## 2016-11-06 MED ORDER — LIDOCAINE 2% (20 MG/ML) 5 ML SYRINGE
INTRAMUSCULAR | Status: AC
  Filled 2016-11-06: qty 5

## 2016-11-06 MED ORDER — FENTANYL CITRATE (PF) 100 MCG/2ML IJ SOLN
INTRAMUSCULAR | Status: DC | PRN
  Administered 2016-11-06: 100 ug via INTRAVENOUS

## 2016-11-06 MED ORDER — PROPOFOL 10 MG/ML IV BOLUS
INTRAVENOUS | Status: DC | PRN
  Administered 2016-11-06: 250 mg via INTRAVENOUS
  Administered 2016-11-06: 50 mg via INTRAVENOUS

## 2016-11-06 MED ORDER — LIDOCAINE 2% (20 MG/ML) 5 ML SYRINGE
INTRAMUSCULAR | Status: DC | PRN
  Administered 2016-11-06: 100 mg via INTRAVENOUS

## 2016-11-06 MED ORDER — PROMETHAZINE HCL 25 MG/ML IJ SOLN
INTRAMUSCULAR | Status: AC
  Administered 2016-11-06: 12.5 mg via INTRAVENOUS
  Filled 2016-11-06: qty 1

## 2016-11-06 MED ORDER — PROPOFOL 10 MG/ML IV BOLUS
INTRAVENOUS | Status: AC
  Filled 2016-11-06: qty 20

## 2016-11-06 MED ORDER — LACTATED RINGERS IV SOLN
INTRAVENOUS | Status: DC | PRN
Start: 2016-11-06 — End: 2016-11-06
  Administered 2016-11-06 (×2): via INTRAVENOUS

## 2016-11-06 MED ORDER — SODIUM CHLORIDE 0.9 % IR SOLN
Status: DC | PRN
  Administered 2016-11-06: 3000 mL

## 2016-11-06 MED ORDER — MIDAZOLAM HCL 2 MG/2ML IJ SOLN
INTRAMUSCULAR | Status: DC | PRN
  Administered 2016-11-06: 2 mg via INTRAVENOUS

## 2016-11-06 MED ORDER — ONDANSETRON HCL 4 MG/2ML IJ SOLN
INTRAMUSCULAR | Status: AC
  Filled 2016-11-06: qty 2

## 2016-11-06 MED ORDER — HYDRALAZINE HCL 20 MG/ML IJ SOLN
INTRAMUSCULAR | Status: AC
  Administered 2016-11-06: 5 mg via INTRAVENOUS
  Filled 2016-11-06: qty 1

## 2016-11-06 MED ORDER — SUCCINYLCHOLINE CHLORIDE 200 MG/10ML IV SOSY
PREFILLED_SYRINGE | INTRAVENOUS | Status: AC
  Filled 2016-11-06: qty 10

## 2016-11-06 MED ORDER — SUCCINYLCHOLINE CHLORIDE 200 MG/10ML IV SOSY
PREFILLED_SYRINGE | INTRAVENOUS | Status: DC | PRN
Start: 2016-11-06 — End: 2016-11-06
  Administered 2016-11-06: 120 mg via INTRAVENOUS

## 2016-11-06 MED ORDER — HYDROMORPHONE HCL 2 MG/ML IJ SOLN
INTRAMUSCULAR | Status: AC
  Administered 2016-11-06: 0.5 mg
  Filled 2016-11-06: qty 1

## 2016-11-06 MED ORDER — MIDAZOLAM HCL 2 MG/2ML IJ SOLN
INTRAMUSCULAR | Status: AC
  Filled 2016-11-06: qty 2

## 2016-11-06 MED ORDER — 0.9 % SODIUM CHLORIDE (POUR BTL) OPTIME
TOPICAL | Status: DC | PRN
  Administered 2016-11-06: 1000 mL

## 2016-11-06 MED ORDER — CHLORHEXIDINE GLUCONATE 4 % EX LIQD
60.0000 mL | Freq: Once | CUTANEOUS | Status: AC
  Administered 2016-11-06: 4 via TOPICAL

## 2016-11-06 MED ORDER — LACTATED RINGERS IV SOLN
INTRAVENOUS | Status: DC
  Administered 2016-11-06: 17:00:00 via INTRAVENOUS

## 2016-11-06 SURGICAL SUPPLY — 47 items
BANDAGE ACE 3X5.8 VEL STRL LF (GAUZE/BANDAGES/DRESSINGS) ×6 IMPLANT
BANDAGE ACE 4X5 VEL STRL LF (GAUZE/BANDAGES/DRESSINGS) ×6 IMPLANT
BNDG CONFORM 2 STRL LF (GAUZE/BANDAGES/DRESSINGS) IMPLANT
BNDG GAUZE ELAST 4 BULKY (GAUZE/BANDAGES/DRESSINGS) ×6 IMPLANT
CORDS BIPOLAR (ELECTRODE) ×3 IMPLANT
CUFF TOURNIQUET SINGLE 18IN (TOURNIQUET CUFF) ×6 IMPLANT
CUFF TOURNIQUET SINGLE 24IN (TOURNIQUET CUFF) IMPLANT
DRAIN PENROSE 1/4X12 LTX STRL (WOUND CARE) ×3 IMPLANT
DRSG ADAPTIC 3X8 NADH LF (GAUZE/BANDAGES/DRESSINGS) ×6 IMPLANT
EVACUATOR 1/8 PVC DRAIN (DRAIN) IMPLANT
FLUID NSS /IRRIG 3000 ML XXX (IV SOLUTION) ×6 IMPLANT
GAUZE SPONGE 4X4 12PLY STRL (GAUZE/BANDAGES/DRESSINGS) ×6 IMPLANT
GAUZE XEROFORM 1X8 LF (GAUZE/BANDAGES/DRESSINGS) ×3 IMPLANT
GAUZE XEROFORM 5X9 LF (GAUZE/BANDAGES/DRESSINGS) ×6 IMPLANT
GLOVE BIOGEL M 8.0 STRL (GLOVE) ×3 IMPLANT
GLOVE SS BIOGEL STRL SZ 8 (GLOVE) ×1 IMPLANT
GLOVE SUPERSENSE BIOGEL SZ 8 (GLOVE) ×2
GOWN STRL REUS W/ TWL LRG LVL3 (GOWN DISPOSABLE) ×1 IMPLANT
GOWN STRL REUS W/ TWL XL LVL3 (GOWN DISPOSABLE) ×2 IMPLANT
GOWN STRL REUS W/TWL LRG LVL3 (GOWN DISPOSABLE) ×2
GOWN STRL REUS W/TWL XL LVL3 (GOWN DISPOSABLE) ×4
HANDPIECE INTERPULSE COAX TIP (DISPOSABLE)
KIT BASIN OR (CUSTOM PROCEDURE TRAY) ×3 IMPLANT
KIT ROOM TURNOVER OR (KITS) ×3 IMPLANT
MANIFOLD NEPTUNE II (INSTRUMENTS) ×3 IMPLANT
NEEDLE HYPO 25GX1X1/2 BEV (NEEDLE) IMPLANT
NS IRRIG 1000ML POUR BTL (IV SOLUTION) ×3 IMPLANT
PACK ORTHO EXTREMITY (CUSTOM PROCEDURE TRAY) ×3 IMPLANT
PAD ARMBOARD 7.5X6 YLW CONV (MISCELLANEOUS) ×3 IMPLANT
PAD CAST 3X4 CTTN HI CHSV (CAST SUPPLIES) ×1 IMPLANT
PAD CAST 4YDX4 CTTN HI CHSV (CAST SUPPLIES) ×1 IMPLANT
PADDING CAST COTTON 3X4 STRL (CAST SUPPLIES) ×2
PADDING CAST COTTON 4X4 STRL (CAST SUPPLIES) ×2
SET CYSTO W/LG BORE CLAMP LF (SET/KITS/TRAYS/PACK) ×3 IMPLANT
SET HNDPC FAN SPRY TIP SCT (DISPOSABLE) IMPLANT
SPLINT FIBERGLASS 4X30 (CAST SUPPLIES) ×3 IMPLANT
SPONGE LAP 4X18 X RAY DECT (DISPOSABLE) ×3 IMPLANT
SUT PROLENE 3 0 PS 1 (SUTURE) ×18 IMPLANT
SUT VIC AB 3-0 FS2 27 (SUTURE) ×12 IMPLANT
SYR CONTROL 10ML LL (SYRINGE) IMPLANT
TOWEL OR 17X24 6PK STRL BLUE (TOWEL DISPOSABLE) ×3 IMPLANT
TOWEL OR 17X26 10 PK STRL BLUE (TOWEL DISPOSABLE) ×3 IMPLANT
TUBE ANAEROBIC SPECIMEN COL (MISCELLANEOUS) IMPLANT
TUBE CONNECTING 12'X1/4 (SUCTIONS) ×1
TUBE CONNECTING 12X1/4 (SUCTIONS) ×2 IMPLANT
WATER STERILE IRR 1000ML POUR (IV SOLUTION) ×3 IMPLANT
YANKAUER SUCT BULB TIP NO VENT (SUCTIONS) ×3 IMPLANT

## 2016-11-06 NOTE — Progress Notes (Signed)
PROGRESS NOTE    Jesus Watkins  XBJ:[id number] DOB: 04/28/1971 DOA: 11/04/2016 PCP: No PCP Per Patient   Brief Narrative: Jesus KinScott Esters is a 45 y.o. with a history of GERD and IV drug abuse. He presented with bilateral arm abscesses and found to also have compartment syndrome.   Assessment & Plan:   Active Problems:   Abscess of antecubital fossa   Hyperglycemia   IVDA (intravenous drug abuse) complicating pregnancy   Leukocytosis   Essential hypertension   Homeless   Cellulitis   Abscesses of arms Cellulitis Compartment syndrome Patient is s/p surgery yesterday. Plan for another trip to the OR this afternoon. Febrile overnight. -follow-up wound/blood cultures -Orthopedic surgery recommendations: plan for surgery again today -continue vancomycin/zosyn  IV drug abuse Heroine is drug of choice. UDS positive for amphetamines, benzodiazepines and opiates. HIV and RPR non reactive -follow-up blood cultures  Hyperglycemia -A1c pending  Hypertension -continue hydralazine prn  Homeless -CSW consulted   DVT prophylaxis: SCDs Code Status: Full code Family Communication: None at bedside Disposition Plan: Anticipate discharge after surgeries and culture sensitivities   Consultants:   Orthopedic surgery  Procedures:   Surgery (11/16)  Antimicrobials:  Vancomycin (11/15>>  Zosyn (11/15>>    Subjective: Patient reports some pain, otherwise no issues overnight.  Objective: Vitals:   11/05/16 2039 11/06/16 0010 11/06/16 0310 11/06/16 0459  BP: (!) 144/65   121/62  Pulse: 90 88  72  Resp: 16   16  Temp: (!) 101.1 F (38.4 C)  99.1 F (37.3 C) 98.8 F (37.1 C)  TempSrc: Oral  Oral Oral  SpO2: 98%   99%  Weight:      Height:        Intake/Output Summary (Last 24 hours) at 11/06/16 1510 Last data filed at 11/06/16 0631  Gross per 24 hour  Intake              710 ml  Output             2550 ml  Net            -1840 ml   Filed Weights   11/05/16  1251  Weight: 72.3 kg (159 lb 6.3 oz)    Examination:  General exam: Appears calm and comfortable Respiratory system: Clear to auscultation. Respiratory effort normal. Cardiovascular system: S1 & S2 heard, RRR. No murmurs, rubs, gallops or clicks. Gastrointestinal system: Abdomen is nondistended, soft and nontender. Normal bowel sounds heard. Central nervous system: Alert and oriented. No focal neurological deficits. Extremities: No edema. No calf tenderness. Bilateral arms wrapped with clean/dry dressings Skin: No cyanosis. No rashes Psychiatry: Judgement and insight appear normal. Mood & affect appropriate.     Data Reviewed: I have personally reviewed following labs and imaging studies  CBC:  Recent Labs Lab 11/04/16 1945 11/06/16 0445  WBC 22.1* 13.1*  NEUTROABS 18.3*  --   HGB 11.2* 9.4*  HCT 34.4* 29.5*  MCV 85.1 87.0  PLT 475* 360   Basic Metabolic Panel:  Recent Labs Lab 11/04/16 1945 11/06/16 0445  NA 131* 136  K 3.7 3.6  CL 97* 102  CO2 24 27  GLUCOSE 137* 210*  BUN 11 <5*  CREATININE 0.93 0.78  CALCIUM 8.5* 7.9*   GFR: Estimated Creatinine Clearance: 114 mL/min (by C-G formula based on SCr of 0.78 mg/dL). Liver Function Tests:  Recent Labs Lab 11/04/16 1945  AST 19  ALT 12*  ALKPHOS 64  BILITOT 0.8  PROT 6.9  ALBUMIN 3.4*  No results for input(s): LIPASE, AMYLASE in the last 168 hours. No results for input(s): AMMONIA in the last 168 hours. Coagulation Profile: No results for input(s): INR, PROTIME in the last 168 hours. Cardiac Enzymes: No results for input(s): CKTOTAL, CKMB, CKMBINDEX, TROPONINI in the last 168 hours. BNP (last 3 results) No results for input(s): PROBNP in the last 8760 hours. HbA1C: No results for input(s): HGBA1C in the last 72 hours. CBG:  Recent Labs Lab 11/05/16 2124 11/06/16 0619  GLUCAP 96 172*   Lipid Profile:  Recent Labs  11/06/16 0445  CHOL 89  HDL 24*  LDLCALC 50  TRIG 76  CHOLHDL 3.7    Thyroid Function Tests: No results for input(s): TSH, T4TOTAL, FREET4, T3FREE, THYROIDAB in the last 72 hours. Anemia Panel: No results for input(s): VITAMINB12, FOLATE, FERRITIN, TIBC, IRON, RETICCTPCT in the last 72 hours. Sepsis Labs:  Recent Labs Lab 11/04/16 2001  LATICACIDVEN 1.03    Recent Results (from the past 240 hour(s))  Culture, blood (Routine X 2)     Status: None (Preliminary result)   Collection Time: 11/04/16  7:45 PM  Result Value Ref Range Status   Specimen Description BLOOD RIGHT HAND  Final   Special Requests BOTTLES DRAWN AEROBIC AND ANAEROBIC 6CC  Final   Culture NO GROWTH < 24 HOURS  Final   Report Status PENDING  Incomplete  Urine culture     Status: Abnormal   Collection Time: 11/04/16  7:47 PM  Result Value Ref Range Status   Specimen Description URINE, CLEAN CATCH  Final   Special Requests NONE  Final   Culture <10,000 COLONIES/mL INSIGNIFICANT GROWTH (A)  Final   Report Status 11/06/2016 FINAL  Final  Culture, blood (Routine X 2)     Status: None (Preliminary result)   Collection Time: 11/04/16  7:50 PM  Result Value Ref Range Status   Specimen Description BLOOD LEFT HAND  Final   Special Requests IN PEDIATRIC BOTTLE 4CC  Final   Culture NO GROWTH < 24 HOURS  Final   Report Status PENDING  Incomplete  Aerobic/Anaerobic Culture (surgical/deep wound)     Status: None (Preliminary result)   Collection Time: 11/05/16  7:23 AM  Result Value Ref Range Status   Specimen Description ABSCESS RIGHT ARM  Final   Special Requests PATIENT ON FOLLOWING  VANCOMYCIN  Final   Gram Stain   Final    ABUNDANT WBC PRESENT, PREDOMINANTLY PMN MODERATE GRAM NEGATIVE RODS MODERATE GRAM POSITIVE COCCI IN PAIRS AND CHAINS FEW GRAM POSITIVE COCCI IN CLUSTERS    Culture PENDING  Incomplete   Report Status PENDING  Incomplete  Aerobic/Anaerobic Culture (surgical/deep wound)     Status: None (Preliminary result)   Collection Time: 11/05/16  7:25 AM  Result Value Ref  Range Status   Specimen Description ABSCESS LEFT ARM  Final   Special Requests PATIENT ON FOLLOWING VANCOMYCIN  Final   Gram Stain   Final    MODERATE WBC PRESENT, PREDOMINANTLY PMN FEW GRAM POSITIVE COCCI IN PAIRS RARE GRAM NEGATIVE RODS    Culture PENDING  Incomplete   Report Status PENDING  Incomplete         Radiology Studies: Dg Chest 2 View  Result Date: 11/04/2016 CLINICAL DATA:  Concern for infection. Fever, with bilateral forearm abscesses. Initial encounter. EXAM: CHEST  2 VIEW COMPARISON:  None. FINDINGS: The lungs are well-aerated. Mild peribronchial thickening is noted. There is no evidence of focal opacification, pleural effusion or pneumothorax. The heart is  normal in size; the mediastinal contour is within normal limits. No acute osseous abnormalities are seen. There is mild chronic deformity of the left lateral seventh rib. IMPRESSION: Mild peribronchial thickening noted.  Lungs otherwise clear. Electronically Signed   By: Roanna RaiderJeffery  Chang M.D.   On: 11/04/2016 21:23   Ct [place] Right W Contrast  Result Date: 11/05/2016 CLINICAL DATA:  Acute onset of right arm infection. Assess for abscess. EXAM: CT OF THE RIGHT ELBOW WITH CONTRAST TECHNIQUE: Multidetector CT imaging was performed following the standard protocol during bolus administration of intravenous contrast. CONTRAST:  75 mL ISOVUE-300 IOPAMIDOL (ISOVUE-300) INJECTION 61% COMPARISON:  None. FINDINGS: There is a large complex multiloculated abscess at the antecubital fossa, measuring approximately 4.8 x 4.7 x 3.6 cm. Mild diffuse soft tissue edema is noted involving the visualized distal upper arm and proximal forearm. The visualized vasculature is grossly unremarkable, though the abscess obscures evaluation of venous vasculature in this region. Soft tissue edema tracks about the musculature. No acute osseous abnormalities are seen. There is no evidence of osseous erosion. No significant elbow joint effusion is identified.  IMPRESSION: 1. Large complex multiloculated abscess at the antecubital fossa, measuring 4.8 x 4.7 x 3.6 cm. 2. Mild diffuse soft tissue edema involving the visualized distal upper arm and proximal forearm. Electronically Signed   By: Roanna RaiderJeffery  Chang M.D.   On: 11/05/2016 02:54        Scheduled Meds: . folic acid  1 mg Oral Daily  . multivitamin with minerals  1 tablet Oral Daily  . piperacillin-tazobactam (ZOSYN)  IV  3.375 g Intravenous Q8H  . sodium chloride flush  3 mL Intravenous Q12H  . thiamine  100 mg Oral Daily   Or  . thiamine  100 mg Intravenous Daily  . vancomycin  1,000 mg Intravenous Q12H   Continuous Infusions:   LOS: 1 day     Jacquelin HawkingRalph Trinita Devlin [facility] 11/06/2016, 3:10 PM Pager: [phone]  If 7PM-7AM, please contact night-coverage [email or web address] Password [email or web address] 11/06/2016, 3:10 PM

## 2016-11-06 NOTE — Progress Notes (Signed)
Pt. Febrile- 101.1 orally- paged Schorr, NP; Tylenol given.

## 2016-11-06 NOTE — Transfer of Care (Signed)
Immediate Anesthesia Transfer of Care Note  Patient: Jesus Watkins  Procedure(s) Performed: Procedure(s): IRRIGATION AND DEBRIDEMENT AND REPAIR OF ASSOCIATED STRUCTURES RIGHT AND LEFT ARMS (Bilateral)  Patient Location: PACU  Anesthesia Type:General  Level of Consciousness: awake  Airway & Oxygen Therapy: Patient Spontanous Breathing and Patient connected to nasal cannula oxygen  Post-op Assessment: Report given to RN and Post -op Vital signs reviewed and stable  Post vital signs: Reviewed and stable  Last Vitals:  Vitals:   11/06/16 0459 11/06/16 1846  BP: 121/62   Pulse: 72 79  Resp: 16 15  Temp: 37.1 C 36.5 C    Last Pain:  Vitals:   11/06/16 1422  TempSrc:   PainSc: 5       Patients Stated Pain Goal: 3 (11/05/16 1449)  Complications: No apparent anesthesia complications

## 2016-11-06 NOTE — H&P (Signed)
  Patient presents for repeat washout.  Patient denies new complaints  He is neurovascular intact in the upper extremities.  All questions have been encouraged and answered.  We will await cultures.  We have discussed with the patient the issues regarding their infection to the extremity. We will continue antibiotics and await culture results. Often times it will take 3-5 days for cultures to become final. During this time we will typically have the patient on intravenous antibiotics until we can find a parenteral route of antibiotic regime specific for the bacteria or organism isolated. We have discussed with the patient the need for daily irrigation and debridement as well as therapy to the area. We have discussed with the patient the necessity of range of motion to the involved joints as discussed today. We have discussed with the patient the unpredictability of infections at times. We'll continue to work towards good pain control and restoration of function. The patient understands the need for meticulous wound care and the necessity of proper followup.  The possible complications of stiffness (loss of motion), resistant infection, possible deep bone infection, possible chronic pain issues, possible need for multiple surgeries and even amputation.  With this in mind the patient understands our goal is to eradicate the infection to quiesence. We will continue to work towards these goals.  Reece Mcbroom Md

## 2016-11-06 NOTE — Anesthesia Preprocedure Evaluation (Signed)
Anesthesia Evaluation  Patient identified by MRN, date of birth, ID band Patient awake    Reviewed: Allergy & Precautions, NPO status , Patient's Chart, lab work & pertinent test results  Airway Mallampati: II  TM Distance: >3 FB Neck ROM: Full    Dental no notable dental hx.    Pulmonary neg pulmonary ROS, Current Smoker,    Pulmonary exam normal breath sounds clear to auscultation       Cardiovascular hypertension, negative cardio ROS Normal cardiovascular exam Rhythm:Regular Rate:Normal     Neuro/Psych negative neurological ROS  negative psych ROS   GI/Hepatic negative GI ROS, (+)     substance abuse  IV drug use,   Endo/Other  negative endocrine ROS  Renal/GU negative Renal ROS  negative genitourinary   Musculoskeletal negative musculoskeletal ROS (+)   Abdominal   Peds negative pediatric ROS (+)  Hematology negative hematology ROS (+)   Anesthesia Other Findings   Reproductive/Obstetrics negative OB ROS                             Lab Results  Component Value Date   WBC 13.1 (H) 11/06/2016   HGB 9.4 (L) 11/06/2016   HCT 29.5 (L) 11/06/2016   MCV 87.0 11/06/2016   PLT 360 11/06/2016   Lab Results  Component Value Date   CREATININE 0.78 11/06/2016   BUN <5 (L) 11/06/2016   NA 136 11/06/2016   K 3.6 11/06/2016   CL 102 11/06/2016   CO2 27 11/06/2016    Anesthesia Physical  Anesthesia Plan  ASA: III  Anesthesia Plan: General   Post-op Pain Management:    Induction: Intravenous  Airway Management Planned: Oral ETT  Additional Equipment:   Intra-op Plan:   Post-operative Plan: Extubation in OR  Informed Consent: I have reviewed the patients History and Physical, chart, labs and discussed the procedure including the risks, benefits and alternatives for the proposed anesthesia with the patient or authorized representative who has indicated his/her  understanding and acceptance.   Dental advisory given  Plan Discussed with: CRNA and Surgeon  Anesthesia Plan Comments:         Anesthesia Quick Evaluation

## 2016-11-06 NOTE — Op Note (Signed)
See dictation#[id number] Amanda PeaGramig MD

## 2016-11-06 NOTE — Anesthesia Procedure Notes (Signed)
Procedure Name: Intubation Date/Time: 11/06/2016 5:49 PM Performed by: Alanda AmassFRIEDMAN, Ezinne Yogi A Pre-anesthesia Checklist: Patient identified, Emergency Drugs available, Suction available, Patient being monitored and Timeout performed Patient Re-evaluated:Patient Re-evaluated prior to inductionOxygen Delivery Method: Circle System Utilized and Circle system utilized Preoxygenation: Pre-oxygenation with 100% oxygen Intubation Type: IV induction Ventilation: Mask ventilation without difficulty Laryngoscope Size: Mac and 3 Grade View: Grade I Tube type: Oral Tube size: 7.5 mm Number of attempts: 1 Airway Equipment and Method: Stylet Placement Confirmation: ETT inserted through vocal cords under direct vision,  positive ETCO2 and breath sounds checked- equal and bilateral Secured at: 21 cm Tube secured with: Tape Dental Injury: Teeth and Oropharynx as per pre-operative assessment

## 2016-11-06 NOTE — Evaluation (Signed)
Occupational Therapy Evaluation Patient Details Name: Ovidio KinScott Lemelle MRN: [id number] DOB: 08/26/1971 Today's Date: 11/06/2016    History of Present Illness Patient has PMH of heroine user, uses almost daily for abscesses to bilateral AC's. He underwent an I&D, fasciotomy, tenolysis and nerve dissection    Clinical Impression   Pt demos decline in function with ADLs with B UE function impaired with pain. Uncertain of level of assist pt will have after acute d/c. Pt back to OR today ot tomorrow nd will re assess as appropriate    Follow Up Recommendations  Outpatient OT    Equipment Recommendations  None recommended by OT    Recommendations for Other Services       Precautions / Restrictions Precautions Precautions: Other (comment) Precaution Comments: B UEs with dressings Required Braces or Orthoses: Other Brace/Splint Other Brace/Splint: B UEs with dressings Restrictions Weight Bearing Restrictions: No      Mobility Bed Mobility Overal bed mobility: Modified Independent                Transfers Overall transfer level: Modified independent                    Balance Overall balance assessment: No apparent balance deficits (not formally assessed)                                          ADL Overall ADL's : Needs assistance/impaired     Grooming: Wash/dry hands;Wash/dry face;Sitting;Minimal assistance   Upper Body Bathing: Minimal assitance   Lower Body Bathing: Minimal assistance   Upper Body Dressing : Minimal assistance   Lower Body Dressing: Minimal assistance   Toilet Transfer: Minimal assistance   Toileting- Clothing Manipulation and Hygiene: Minimal assistance       Functional mobility during ADLs: Supervision/safety General ADL Comments: pt educated on bathing and dressing techniques     Vision Vision Assessment?: No apparent visual deficits              Pertinent Vitals/Pain Pain Assessment: 0-10 Pain  Score: 4  Pain Descriptors / Indicators: Aching;Sore Pain Intervention(s): Monitored during session;Repositioned;Patient requesting pain meds-RN notified     Hand Dominance Right   Extremity/Trunk Assessment Upper Extremity Assessment Upper Extremity Assessment: RUE deficits/detail;LUE deficits/detail RUE Deficits / Details: B UE dressings, shoulder ROM ok, no edema RUE: Unable to fully assess due to immobilization           Communication Communication Communication: No difficulties   Cognition Arousal/Alertness: Awake/alert Behavior During Therapy: Agitated (pt states that he's agitated due to NPO stauts and he needs some pain meds) Overall Cognitive Status: Within Functional Limits for tasks assessed                     General Comments   pt anxious and agitated. States that e is agitated due to needing pain meds being NPO and going through withdrawl                 Home Living Family/patient expects to be discharged to:: Unsure                                 Additional Comments: pt is homeless and states that he will d/c to a friend's home      Prior Functioning/Environment Level of Independence:  Independent                 OT Problem List: Impaired UE functional use;Pain;Decreased range of motion;Decreased strength;Decreased activity tolerance   OT Treatment/Interventions: Self-care/ADL training;DME and/or AE instruction;Therapeutic activities;Patient/family education;Therapeutic exercise    OT Goals(Current goals can be found in the care plan section) ADL Goals Pt Will Perform Grooming: with min guard assist;with supervision;with set-up Pt Will Perform Upper Body Bathing: with min guard assist;with supervision;with set-up Pt Will Perform Lower Body Bathing: with min guard assist;with supervision;with set-up Pt Will Perform Upper Body Dressing: with min guard assist;with supervision;with set-up Pt Will Perform Lower Body Dressing:  with min guard assist;with supervision;with set-up Pt Will Perform Toileting - Clothing Manipulation and hygiene: with min guard assist;with supervision Additional ADL Goal #1: Pt will verbalize and demo finger ROM and edema mgt with pillow correctly for B UEs  OT Frequency: Min 2X/week   Barriers to D/C:    uncertain                     End of Session Nurse Communication: Patient requests pain meds  Activity Tolerance: Patient tolerated treatment well Patient left: in bed;with call bell/phone within reach (sitting EOB)   Time:  -    Charges:    G-Codes:    Galen ManilaSpencer, Francois Elk Jeanette 11/06/2016, 2:25 PM

## 2016-11-06 NOTE — Anesthesia Postprocedure Evaluation (Signed)
Anesthesia Post Note  Patient: Jesus Watkins  Procedure(s) Performed: Procedure(s) (LRB): IRRIGATION AND DEBRIDEMENT AND REPAIR OF ASSOCIATED STRUCTURES RIGHT AND LEFT ARMS (Bilateral)  Patient location during evaluation: PACU Anesthesia Type: General Level of consciousness: awake and alert Pain management: pain level controlled Vital Signs Assessment: post-procedure vital signs reviewed and stable Respiratory status: spontaneous breathing, nonlabored ventilation, respiratory function stable and patient connected to nasal cannula oxygen Cardiovascular status: blood pressure returned to baseline and stable Postop Assessment: no signs of nausea or vomiting Anesthetic complications: no    Last Vitals:  Vitals:   11/06/16 1952 11/06/16 2008  BP: (!) 156/82 (!) 178/77  Pulse: 66 71  Resp: 14 16  Temp:  36.8 C    Last Pain:  Vitals:   11/06/16 2017  TempSrc:   PainSc: 6                  Cecile HearingStephen Edward Abas Leicht

## 2016-11-06 NOTE — Progress Notes (Signed)
PT Cancellation Note  Patient Details Name: Ovidio KinScott Anes MRN: [id number] DOB: 12/29/1970   Cancelled Treatment:    Reason Eval/Treat Not Completed: OT screened, no needs identified, will sign off.  OT screened for PT needs, pt is mobilizing well and OT is addressing arms.  PT to sign off.  See OT notes for details.   Thanks,    Rollene Rotundaebecca B. Nasif Bos, PT, DPT [phone]   11/06/2016, 3:58 PM

## 2016-11-07 ENCOUNTER — Encounter (HOSPITAL_COMMUNITY): Payer: Self-pay | Admitting: Orthopedic Surgery

## 2016-11-07 LAB — GLUCOSE, CAPILLARY
GLUCOSE-CAPILLARY: 108 mg/dL — AB (ref 65–99)
Glucose-Capillary: 163 mg/dL — ABNORMAL HIGH (ref 65–99)
Glucose-Capillary: 191 mg/dL — ABNORMAL HIGH (ref 65–99)
Glucose-Capillary: 96 mg/dL (ref 65–99)

## 2016-11-07 NOTE — Progress Notes (Signed)
PROGRESS NOTE    Jesus KinScott Watkins  UJW:[id number]:[id number] DOB: 09/16/1971 DOA: 11/04/2016 PCP: No PCP Per Patient   Brief Narrative: Jesus Watkins is a 45 y.o. with a history of GERD and IV drug abuse. He presented with bilateral arm abscesses and found to also have compartment syndrome.   Assessment & Plan:   Active Problems:   Abscess of antecubital fossa   Hyperglycemia   IVDA (intravenous drug abuse) complicating pregnancy   Leukocytosis   Essential hypertension   Homeless   Cellulitis   Abscesses of arms Cellulitis Compartment syndrome Patient is s/p surgery yesterday and the day prior. Febrile tonight. -follow-up wound/blood cultures -Orthopedic surgery recommendations -continue vancomycin/zosyn  IV drug abuse Heroine is drug of choice. UDS positive for amphetamines, benzodiazepines and opiates. HIV and RPR non reactive -follow-up blood cultures  Hyperglycemia -A1c pending  Hypertension -continue hydralazine prn  Homeless -CSW consulted   DVT prophylaxis: SCDs Code Status: Full code Family Communication: None at bedside Disposition Plan: Anticipate discharge after surgeries and culture sensitivities   Consultants:   Orthopedic surgery  Procedures:   Surgery (11/16)  Antimicrobials:  Vancomycin (11/15>>  Zosyn (11/15>>    Subjective: Surgery went well. Pain tolerable. No issues. Febrile this morning.  Objective: Vitals:   11/06/16 2145 11/07/16 0100 11/07/16 0411 11/07/16 1300  BP: (!) 156/58 (!) 149/64 (!) 136/56 (!) 162/56  Pulse:  92 77 79  Resp:  16 16 18   Temp:  (!) 100.9 F (38.3 C) 98.7 F (37.1 C) 98.7 F (37.1 C)  TempSrc:  Oral Oral Oral  SpO2:  96% 99% 99%  Weight:      Height:        Intake/Output Summary (Last 24 hours) at 11/07/16 1616 Last data filed at 11/07/16 1500  Gross per 24 hour  Intake             2593 ml  Output             2130 ml  Net              463 ml   Filed Weights   11/05/16 1251  Weight: 72.3 kg  (159 lb 6.3 oz)    Examination:  General exam: Appears calm and comfortable Respiratory system: Clear to auscultation. Respiratory effort normal. Cardiovascular system: S1 & S2 heard, RRR. No murmurs, rubs, gallops or clicks. Gastrointestinal system: Abdomen is nondistended, soft and nontender. Normal bowel sounds heard. Central nervous system: Alert and oriented. No focal neurological deficits. Extremities: No edema. No calf tenderness. Bilateral arms wrapped with clean/dry dressings Skin: No cyanosis. No rashes Psychiatry: Judgement and insight appear normal. Mood & affect appropriate.     Data Reviewed: I have personally reviewed following labs and imaging studies  CBC:  Recent Labs Lab 11/04/16 1945 11/06/16 0445  WBC 22.1* 13.1*  NEUTROABS 18.3*  --   HGB 11.2* 9.4*  HCT 34.4* 29.5*  MCV 85.1 87.0  PLT 475* 360   Basic Metabolic Panel:  Recent Labs Lab 11/04/16 1945 11/06/16 0445  NA 131* 136  K 3.7 3.6  CL 97* 102  CO2 24 27  GLUCOSE 137* 210*  BUN 11 <5*  CREATININE 0.93 0.78  CALCIUM 8.5* 7.9*   GFR: Estimated Creatinine Clearance: 114 mL/min (by C-G formula based on SCr of 0.78 mg/dL). Liver Function Tests:  Recent Labs Lab 11/04/16 1945  AST 19  ALT 12*  ALKPHOS 64  BILITOT 0.8  PROT 6.9  ALBUMIN 3.4*   No results for  input(s): LIPASE, AMYLASE in the last 168 hours. No results for input(s): AMMONIA in the last 168 hours. Coagulation Profile: No results for input(s): INR, PROTIME in the last 168 hours. Cardiac Enzymes: No results for input(s): CKTOTAL, CKMB, CKMBINDEX, TROPONINI in the last 168 hours. BNP (last 3 results) No results for input(s): PROBNP in the last 8760 hours. HbA1C: No results for input(s): HGBA1C in the last 72 hours. CBG:  Recent Labs Lab 11/05/16 2124 11/06/16 0619 11/06/16 2144 11/07/16 0622 11/07/16 1311  GLUCAP 96 172* 183* 108* 163*   Lipid Profile:  Recent Labs  11/06/16 0445  CHOL 89  HDL 24*    LDLCALC 50  TRIG 76  CHOLHDL 3.7   Thyroid Function Tests: No results for input(s): TSH, T4TOTAL, FREET4, T3FREE, THYROIDAB in the last 72 hours. Anemia Panel: No results for input(s): VITAMINB12, FOLATE, FERRITIN, TIBC, IRON, RETICCTPCT in the last 72 hours. Sepsis Labs:  Recent Labs Lab 11/04/16 2001  LATICACIDVEN 1.03    Recent Results (from the past 240 hour(s))  Culture, blood (Routine X 2)     Status: None (Preliminary result)   Collection Time: 11/04/16  7:45 PM  Result Value Ref Range Status   Specimen Description BLOOD RIGHT HAND  Final   Special Requests BOTTLES DRAWN AEROBIC AND ANAEROBIC 6CC  Final   Culture NO GROWTH 3 DAYS  Final   Report Status PENDING  Incomplete  Urine culture     Status: Abnormal   Collection Time: 11/04/16  7:47 PM  Result Value Ref Range Status   Specimen Description URINE, CLEAN CATCH  Final   Special Requests NONE  Final   Culture <10,000 COLONIES/mL INSIGNIFICANT GROWTH (A)  Final   Report Status 11/06/2016 FINAL  Final  Culture, blood (Routine X 2)     Status: None (Preliminary result)   Collection Time: 11/04/16  7:50 PM  Result Value Ref Range Status   Specimen Description BLOOD LEFT HAND  Final   Special Requests IN PEDIATRIC BOTTLE 4CC  Final   Culture NO GROWTH 3 DAYS  Final   Report Status PENDING  Incomplete  Aerobic/Anaerobic Culture (surgical/deep wound)     Status: None (Preliminary result)   Collection Time: 11/05/16  7:23 AM  Result Value Ref Range Status   Specimen Description ABSCESS RIGHT ARM  Final   Special Requests PATIENT ON FOLLOWING  VANCOMYCIN  Final   Gram Stain   Final    ABUNDANT WBC PRESENT, PREDOMINANTLY PMN MODERATE GRAM NEGATIVE RODS MODERATE GRAM POSITIVE COCCI IN PAIRS AND CHAINS FEW GRAM POSITIVE COCCI IN CLUSTERS    Culture   Final    NORMAL SKIN FLORA NO ANAEROBES ISOLATED; CULTURE IN PROGRESS FOR 5 DAYS    Report Status PENDING  Incomplete  Aerobic/Anaerobic Culture (surgical/deep  wound)     Status: None (Preliminary result)   Collection Time: 11/05/16  7:25 AM  Result Value Ref Range Status   Specimen Description ABSCESS LEFT ARM  Final   Special Requests PATIENT ON FOLLOWING VANCOMYCIN  Final   Gram Stain   Final    MODERATE WBC PRESENT, PREDOMINANTLY PMN FEW GRAM POSITIVE COCCI IN PAIRS RARE GRAM NEGATIVE RODS    Culture   Final    FEW STAPHYLOCOCCUS AUREUS NO ANAEROBES ISOLATED; CULTURE IN PROGRESS FOR 5 DAYS    Report Status PENDING  Incomplete         Radiology Studies: No results found.      Scheduled Meds: . folic acid  1 mg  Oral Daily  . multivitamin with minerals  1 tablet Oral Daily  . piperacillin-tazobactam (ZOSYN)  IV  3.375 g Intravenous Q8H  . sodium chloride flush  3 mL Intravenous Q12H  . thiamine  100 mg Oral Daily  . vancomycin  1,000 mg Intravenous Q12H   Continuous Infusions:   LOS: 2 days     Jacquelin HawkingRalph Krystiana Fornes [facility] 11/07/2016, 4:16 PM Pager: [phone]  If 7PM-7AM, please contact night-coverage [email or web address] Password [email or web address] 11/07/2016, 4:16 PM

## 2016-11-07 NOTE — Op Note (Signed)
NAME:  Jesus Watkins, Dontrez                ACCOUNT NO.:  [id number]  MEDICAL RECORD NO.:  [id number]  LOCATION:  5N10C                        FACILITY:  [facility]  PHYSICIAN:  Dionne AnoWilliam M. Amalya Salmons, M.D.DATE OF BIRTH:  12/24/1970  DATE OF PROCEDURE:11/06/16 DATE OF DISCHARGE:TBD                              OPERATIVE REPORT   PREOPERATIVE DIAGNOSES:  Status post irrigation and debridement, right and left arms including the upper arm, forearm, and antecubital fossa with compartment release, decompression of abscess and tenolysis, tenosynovectomy.  POSTOPERATIVE DIAGNOSES:  Status post irrigation and debridement, right and left arms including the upper arm, forearm, and antecubital fossa with compartment release, decompression of abscess and tenolysis, tenosynovectomy.  PROCEDURE: 1. Right arm, elbow, and forearm irrigation and debridement of skin,     subcutaneous tissue, muscle, and tendon.  This was an excisional     debridement with scissor, knife, and curette, approximately 12 x 6     cm. 2. Tenolysis, tenosynovectomy, flexor pronator apparatus, biceps     tendon, biceps muscle right upper arm, elbow, and forearm. 3. Complex wound closure with small rotation flap in the antecubital     fossa, right arm. 4. Left arm irrigation and debridement of skin, subcutaneous tissue,     muscle, tendon, this was an excisional debridement with curette,     knife, and scissor 6 x 5 cm. 5. Tenolysis tenosynovectomy flexor pronator apparatus, biceps tendon,     and biceps muscle, left arm, elbow, forearm. 6. Complex wound closure over Penrose drain, left arm, forearm, and     upper arm.  SURGEON:  Dionne AnoWilliam M. Amanda PeaGramig, M.D.  ASSISTANT:  Karie ChimeraBrian Buchanan, PA-C.  COMPLICATIONS:  None.  ANESTHESIA:  General.  TOURNIQUET TIME:  Zero.  ESTIMATED BLOOD LOSS:  Minimal.  INDICATIONS FOR THE PROCEDURE:  The patient is a pleasant male who presents with the above-mentioned diagnoses.  He has severe  heroin addiction.  He has undergone I and D, and presents for repeat I and D, reconstruction as necessary, as well as tenolysis, tenosynovectomy.  OPERATION IN DETAIL:  The patient was seen by me and Anesthesia.  Taken to the operating suite.  Underwent smooth induction of general anesthetic.  He was prepped and draped in usual sterile fashion by both arms with Betadine scrub and paint x2, isolation of sterile field ensued.  The patient had the right arm addressed first with irrigation and debridement of skin, subcutaneous tissue, muscle, tendon, and associated soft tissue structures.  I did tie off one of his venous contributory veins.  We performed careful and cautious approach to the arm with curette, knife, blade, and scissor utilizing a wide debridement.  Following this, I then performed a tenolysis, tenosynovectomy of the biceps muscle, biceps tendon, and flexor pronator apparatus.  Following this, we placed 3 L of fluid in the wound and irrigated copiously.  This was done with the tourniquet deflated.  Following this, I then undermined the skin and soft tissue and performed a rotation flap to cover the epithelialized and dead tissue, which had previously been removed in the antecubital region.  A far-near, near-far 3-0 Prolene suture was used.  The patient tolerated this well and it was closed  over Penrose drain.  Following this, sterile bandage was placed and ultimately at the end of the case after the left arm was addressed, we placed a long-arm splint.  Following this, attention was turned towards the left arm.  Left arm was treated with irrigation and debridement of skin, subcutaneous tissue, muscle, tendon over a 6 x 5 cm region.  Curette, knife, blade, and scissor were used for the excisional debridement.  Following this, 3 L of fluid replaced through and through the wounds.  The patient also underwent a biceps tenolysis, tenosynovectomy, and biceps muscle as well as  flexor pronator muscle tenolysis, tenosynovectomy.  Following this, we performed a complex wound closure with 3-0 Vicryl over a Penrose drain along the 6 cm area.  Following this, he was dressed in a soft dressing.  He had excellent refill of soft compartments.  No complicating features.  Wound conditions looked excellent.  We will change bandages in 48 hours or so, make sure things are going smoothly.  I have discussed with him the relevant issues, do's and don'ts, and we will plan to proceed accordingly.  This should be his last washout, his conditions with much improved.  We will await cultures and continue his infectious care plan.     Dionne AnoWilliam M. Amanda PeaGramig, M.D.     [facility] IncWMG/MEDQ  D:  11/06/2016  T:  11/07/2016  Job:  [id number]

## 2016-11-07 NOTE — Progress Notes (Signed)
We have discussed with the patient the issues regarding their infection to the extremity. We will continue antibiotics and await culture results. Often times it will take 3-5 days for cultures to become final. During this time we will typically have the patient on intravenous antibiotics until we can find a parenteral route of antibiotic regime specific for the bacteria or organism isolated. We have discussed with the patient the need for daily irrigation and debridement as well as therapy to the area. We have discussed with the patient the necessity of range of motion to the involved joints as discussed today. We have discussed with the patient the unpredictability of infections at times. We'll continue to work towards good pain control and restoration of function. The patient understands the need for meticulous wound care and the necessity of proper followup.  The possible complications of stiffness (loss of motion), resistant infection, possible deep bone infection, possible chronic pain issues, possible need for multiple surgeries and even amputation.  With this in mind the patient understands our goal is to eradicate the infection to quiesence. We will continue to work towards these goals.Patient ID: Jesus Watkins, male   DOB: 04/07/1971, 45 y.o.   MRN: [id number]    2nd I&D went very well.  We will change dressing Saturday and plan for transition to daily wound care.  All wound conditions are improved. Garlin Batdorf MD

## 2016-11-08 LAB — BASIC METABOLIC PANEL
Anion gap: 9 (ref 5–15)
BUN: 10 mg/dL (ref 6–20)
CHLORIDE: 105 mmol/L (ref 101–111)
CO2: 25 mmol/L (ref 22–32)
Calcium: 8.2 mg/dL — ABNORMAL LOW (ref 8.9–10.3)
Creatinine, Ser: 1.63 mg/dL — ABNORMAL HIGH (ref 0.61–1.24)
GFR calc non Af Amer: 50 mL/min — ABNORMAL LOW (ref >60)
GFR, EST AFRICAN AMERICAN: 58 mL/min — AB (ref >60)
Glucose, Bld: 141 mg/dL — ABNORMAL HIGH (ref 65–99)
POTASSIUM: 3.6 mmol/L (ref 3.5–5.1)
SODIUM: 139 mmol/L (ref 135–145)

## 2016-11-08 LAB — CBC
HEMATOCRIT: 29.7 % — AB (ref 39.0–52.0)
Hemoglobin: 9.5 g/dL — ABNORMAL LOW (ref 13.0–17.0)
MCH: 27.9 pg (ref 26.0–34.0)
MCHC: 32 g/dL (ref 30.0–36.0)
MCV: 87.4 fL (ref 78.0–100.0)
Platelets: 442 10*3/uL — ABNORMAL HIGH (ref 150–400)
RBC: 3.4 MIL/uL — AB (ref 4.22–5.81)
RDW: 13.8 % (ref 11.5–15.5)
WBC: 12.1 10*3/uL — AB (ref 4.0–10.5)

## 2016-11-08 LAB — GLUCOSE, CAPILLARY
GLUCOSE-CAPILLARY: 94 mg/dL (ref 65–99)
GLUCOSE-CAPILLARY: 128 mg/dL — AB (ref 65–99)
Glucose-Capillary: 111 mg/dL — ABNORMAL HIGH (ref 65–99)
Glucose-Capillary: 143 mg/dL — ABNORMAL HIGH (ref 65–99)

## 2016-11-08 MED ORDER — VANCOMYCIN HCL 10 G IV SOLR
1250.0000 mg | INTRAVENOUS | Status: DC
  Administered 2016-11-09: 1250 mg via INTRAVENOUS
  Filled 2016-11-08: qty 1250

## 2016-11-08 MED ORDER — SODIUM CHLORIDE 0.9 % IV BOLUS (SEPSIS)
1000.0000 mL | Freq: Once | INTRAVENOUS | Status: AC
  Administered 2016-11-08: 1000 mL via INTRAVENOUS

## 2016-11-08 NOTE — Progress Notes (Signed)
Patient  carefully had the bandage removed without difficulty. Following this we performed a  irrigation and debridement.  Procedure note: Patient underwent thorough prep and sterile field application. Following this with combination knife blade scissor tip and curette we performed irrigation and debridement of skin and subcutaneous tissue. Deep tissue was also irrigated and removed. Patient tolerated this well. Copious  amounts of saline were placed in the wound. There is approximately a liter of irrigant applied to the wound. Once again this was an excisional debridement of skin subcutaneous tissue as well as associated deep tissue about the wound. This was an irrigation and debridement right elbow. I discussed with patient we will advance him to dressing changes that he can perform by himself.  The left arm underwent dressing change but did not require a significant I&D  Following this the patient was packed with wet-to-dry gauze. We'll continue bedside irrigation and debridement and dressing changes in an aggressive fashion.  Patient tolerated the irrigation and debridement procedure without difficulty.  The patient understands the necessity of strict wound care antibiotics and other measures. Oftentimes wounds will take days to declare themselves. We'll do everything in our power to try and ensure uneventful  Healing.  I would recommend dressing changes tomorrow and Monday and likely DC Monday on oral antibiotic if appropriate all questions haven't encouraged and answered       Patient ID: Jesus Watkins, male   DOB: 10/06/1971, 45 y.o.   MRN: [id number]   Vladimir CroftsGramig M.D.

## 2016-11-08 NOTE — Progress Notes (Signed)
PROGRESS NOTE    Jesus Watkins  [id number] DOB: 04/19/1971 DOA: 11/04/2016 PCP: No PCP Per Patient   Brief Narrative: Jesus Watkins is a 45 y.o. with a history of GERD and IV drug abuse. He presented with bilateral arm abscesses and found to also have compartment syndrome.   Assessment & Plan:   Active Problems:   Abscess of antecubital fossa   Hyperglycemia   IVDA (intravenous drug abuse) complicating pregnancy   Leukocytosis   Essential hypertension   Homeless   Cellulitis   Abscesses of arms Cellulitis Compartment syndrome Patient is s/p surgery yesterday and the day prior. Febrile tonight. -follow-up wound/blood cultures -Orthopedic surgery recommendations -continue vancomycin/zosyn  IV drug abuse Heroine is drug of choice. UDS positive for amphetamines, benzodiazepines and opiates. HIV and RPR non reactive -follow-up blood cultures  Hyperglycemia -A1c pending  Hypertension -continue hydralazine prn  Homeless -CSW consulted   DVT prophylaxis: SCDs Code Status: Full code Family Communication: None at bedside Disposition Plan: Anticipate discharge after culture sensitivities   Consultants:   Orthopedic surgery  Procedures:   Surgery (11/16)  Antimicrobials:  Vancomycin (11/15>>   Zosyn (11/15>>    Subjective: Feels good today. Afebrile over last 24 hours.  Objective: Vitals:   11/07/16 0411 11/07/16 1300 11/07/16 2033 11/08/16 0451  BP: (!) 136/56 (!) 162/56 (!) 141/82 130/67  Pulse: 77 79 72 82  Resp: 16 18 18 18   Temp: 98.7 F (37.1 C) 98.7 F (37.1 C) 99 F (37.2 C) 98.4 F (36.9 C)  TempSrc: Oral Oral Oral Oral  SpO2: 99% 99% 100% 98%  Weight:      Height:        Intake/Output Summary (Last 24 hours) at 11/08/16 1338 Last data filed at 11/08/16 0845  Gross per 24 hour  Intake             1533 ml  Output              800 ml  Net              733 ml   Filed Weights   11/05/16 1251  Weight: 72.3 kg (159 lb 6.3 oz)     Examination:  General exam: Appears calm and comfortable Respiratory system: Clear to auscultation. Respiratory effort normal. Cardiovascular system: S1 & S2 heard, RRR. No murmurs, rubs, gallops or clicks. Gastrointestinal system: Abdomen is nondistended, soft and nontender. Normal bowel sounds heard. Central nervous system: Alert and oriented. No focal neurological deficits. Extremities: No edema. No calf tenderness. Bilateral arms wrapped with clean/dry dressings Skin: No cyanosis. No rashes Psychiatry: Judgement and insight appear normal. Mood & affect appropriate.     Data Reviewed: I have personally reviewed following labs and imaging studies  CBC:  Recent Labs Lab 11/04/16 1945 11/06/16 0445 11/08/16 0356  WBC 22.1* 13.1* 12.1*  NEUTROABS 18.3*  --   --   HGB 11.2* 9.4* 9.5*  HCT 34.4* 29.5* 29.7*  MCV 85.1 87.0 87.4  PLT 475* 360 442*   Basic Metabolic Panel:  Recent Labs Lab 11/04/16 1945 11/06/16 0445 11/08/16 0356  NA 131* 136 139  K 3.7 3.6 3.6  CL 97* 102 105  CO2 24 27 25   GLUCOSE 137* 210* 141*  BUN 11 <5* 10  CREATININE 0.93 0.78 1.63*  CALCIUM 8.5* 7.9* 8.2*   GFR: Estimated Creatinine Clearance: 56 mL/min (by C-G formula based on SCr of 1.63 mg/dL (H)). Liver Function Tests:  Recent Labs Lab 11/04/16 1945  AST 19  ALT 12*  ALKPHOS 64  BILITOT 0.8  PROT 6.9  ALBUMIN 3.4*   No results for input(s): LIPASE, AMYLASE in the last 168 hours. No results for input(s): AMMONIA in the last 168 hours. Coagulation Profile: No results for input(s): INR, PROTIME in the last 168 hours. Cardiac Enzymes: No results for input(s): CKTOTAL, CKMB, CKMBINDEX, TROPONINI in the last 168 hours. BNP (last 3 results) No results for input(s): PROBNP in the last 8760 hours. HbA1C: No results for input(s): HGBA1C in the last 72 hours. CBG:  Recent Labs Lab 11/07/16 1311 11/07/16 1716 11/07/16 2119 11/08/16 0607 11/08/16 1144  GLUCAP 163* 191* 96  111* 143*   Lipid Profile:  Recent Labs  11/06/16 0445  CHOL 89  HDL 24*  LDLCALC 50  TRIG 76  CHOLHDL 3.7   Thyroid Function Tests: No results for input(s): TSH, T4TOTAL, FREET4, T3FREE, THYROIDAB in the last 72 hours. Anemia Panel: No results for input(s): VITAMINB12, FOLATE, FERRITIN, TIBC, IRON, RETICCTPCT in the last 72 hours. Sepsis Labs:  Recent Labs Lab 11/04/16 2001  LATICACIDVEN 1.03    Recent Results (from the past 240 hour(s))  Culture, blood (Routine X 2)     Status: None (Preliminary result)   Collection Time: 11/04/16  7:45 PM  Result Value Ref Range Status   Specimen Description BLOOD RIGHT HAND  Final   Special Requests BOTTLES DRAWN AEROBIC AND ANAEROBIC 6CC  Final   Culture NO GROWTH 4 DAYS  Final   Report Status PENDING  Incomplete  Urine culture     Status: Abnormal   Collection Time: 11/04/16  7:47 PM  Result Value Ref Range Status   Specimen Description URINE, CLEAN CATCH  Final   Special Requests NONE  Final   Culture <10,000 COLONIES/mL INSIGNIFICANT GROWTH (A)  Final   Report Status 11/06/2016 FINAL  Final  Culture, blood (Routine X 2)     Status: None (Preliminary result)   Collection Time: 11/04/16  7:50 PM  Result Value Ref Range Status   Specimen Description BLOOD LEFT HAND  Final   Special Requests IN PEDIATRIC BOTTLE 4CC  Final   Culture NO GROWTH 4 DAYS  Final   Report Status PENDING  Incomplete  Aerobic/Anaerobic Culture (surgical/deep wound)     Status: None (Preliminary result)   Collection Time: 11/05/16  7:23 AM  Result Value Ref Range Status   Specimen Description ABSCESS RIGHT ARM  Final   Special Requests PATIENT ON FOLLOWING  VANCOMYCIN  Final   Gram Stain   Final    ABUNDANT WBC PRESENT, PREDOMINANTLY PMN MODERATE GRAM NEGATIVE RODS MODERATE GRAM POSITIVE COCCI IN PAIRS AND CHAINS FEW GRAM POSITIVE COCCI IN CLUSTERS    Culture   Final    NORMAL SKIN FLORA NO ANAEROBES ISOLATED; CULTURE IN PROGRESS FOR 5 DAYS     Report Status PENDING  Incomplete  Aerobic/Anaerobic Culture (surgical/deep wound)     Status: None (Preliminary result)   Collection Time: 11/05/16  7:25 AM  Result Value Ref Range Status   Specimen Description ABSCESS LEFT ARM  Final   Special Requests PATIENT ON FOLLOWING VANCOMYCIN  Final   Gram Stain   Final    MODERATE WBC PRESENT, PREDOMINANTLY PMN FEW GRAM POSITIVE COCCI IN PAIRS RARE GRAM NEGATIVE RODS    Culture   Final    FEW STAPHYLOCOCCUS AUREUS NO ANAEROBES ISOLATED; CULTURE IN PROGRESS FOR 5 DAYS    Report Status PENDING  Incomplete  Radiology Studies: No results found.      Scheduled Meds: . folic acid  1 mg Oral Daily  . multivitamin with minerals  1 tablet Oral Daily  . piperacillin-tazobactam (ZOSYN)  IV  3.375 g Intravenous Q8H  . sodium chloride flush  3 mL Intravenous Q12H  . thiamine  100 mg Oral Daily  . [START ON 11/09/2016] vancomycin  1,250 mg Intravenous Q24H   Continuous Infusions:   LOS: 3 days     Jacquelin HawkingRalph Easton Fetty [facility] 11/08/2016, 1:38 PM Pager: [phone]  If 7PM-7AM, please contact night-coverage [email or web address] Password [email or web address] 11/08/2016, 1:38 PM

## 2016-11-08 NOTE — Care Management Note (Signed)
Case Management Note  Patient Details  Name: Oaklan Persons MRN: [id number] Date of Birth: 11-Aug-1971  Subjective/Objective: 45 yo M s/p irrigation and debridement, R and L arms including the upper arm, forearm, and antecubital fossa with compartment release, decompression of abscess and tenolysis, tenosynovectomy. He has severe heroin addiction.             Action/Plan: Received referral to assist with meds.   Expected Discharge Date:                  Expected Discharge Plan:  Home/Self Care  In-House Referral:  Clinical Social Work  Discharge planning Services  CM Consult  Post Acute Care Choice:    Choice offered to:     DME Arranged:    DME Agency:     HH Arranged:    HH Agency:     Status of Service:  In process, will continue to follow  If discussed at Long Length of Stay Meetings, dates discussed:    Additional Comments: Met with pt at bedside. He reports that he may have a place to stay when he is D/C. He doesn't have any medical insurance. Informed pt that we will help him with the medications when he is D/C. He stated that he hasn't received any assistance for meds through [facility]. Informed pt that we won't be able to assist him with narcotics and he verbalized understanding. Will continue to f/u to assist with the D/C plan.  Norina Buzzard, RN 11/08/2016, 9:48 AM

## 2016-11-08 NOTE — Progress Notes (Signed)
Pharmacy Antibiotic Note  Jesus Watkins is a 45 y.o. male admitted on 11/04/2016 with swollen bilateral arm + wrist. Pt was found to have bilateral abscesses in the antecubital region with compartment syndrome in his R arm. Abx for bilateral arm abscesses secondary to IV drug use. WBC 22.1 >>13.1>12.1, SCR 0.78.>1.63.  AF, normalized creat cl 58; last dose 1 gm given ~ 2 am S/p I&D x 2  L and R arms including upper arm, forearm, antecubital fossa w/ compartment release, decompression of abscess and tenolysis, tensosynovectomy HIV NR, RPR NR Plan to DC home on Monday on PO abx.   Vancomycin goal: 10-15 mcg/ml  Plan: - continue Zosyn 3.375 g IV q8h - decrease vancomycin to 1250 mg IV q24 due to rise in creatinine -Monitor renal fx, cultures, duration of therapy, obtain VT at C[facility]  Temp (24hrs), Avg:98.7 F (37.1 C), Min:98.4 F (36.9 C), Max:99 F (37.2 C)   Recent Labs Lab 11/04/16 1945 11/04/16 2001 11/06/16 0445 11/08/16 0356  WBC 22.1*  --  13.1* 12.1*  CREATININE 0.93  --  0.78 1.63*  LATICACIDVEN  --  1.03  --   --     Estimated Creatinine Clearance: 56 mL/min (by C-G formula based on SCr of 1.63 mg/dL (H)).    No Known Allergies  11/15 vancomycin > 11/15 zosyn >   11/15 urine cx: insignificant growth 11/14 blood cx: ngtd 11/15 abscess cx left arm: few GPC pairs, rare GNR, few SA 11/15 abscess cx right arm: mod GNR, mod GPC pair, few GPC clusters - normal skin flora  Thank you for allowing pharmacy to be a part of this patient's care.  Herby AbrahamMichelle T. Shaniquia Brafford, Pharm.D. [phone] 11/08/2016 9:39 AM

## 2016-11-09 DIAGNOSIS — N179 Acute kidney failure, unspecified: Secondary | ICD-10-CM

## 2016-11-09 LAB — BASIC METABOLIC PANEL
Anion gap: 10 (ref 5–15)
BUN: 10 mg/dL (ref 6–20)
CHLORIDE: 106 mmol/L (ref 101–111)
CO2: 25 mmol/L (ref 22–32)
Calcium: 8.6 mg/dL — ABNORMAL LOW (ref 8.9–10.3)
Creatinine, Ser: 1.72 mg/dL — ABNORMAL HIGH (ref 0.61–1.24)
GFR calc non Af Amer: 47 mL/min — ABNORMAL LOW (ref >60)
GFR, EST AFRICAN AMERICAN: 54 mL/min — AB (ref >60)
Glucose, Bld: 81 mg/dL (ref 65–99)
POTASSIUM: 4.2 mmol/L (ref 3.5–5.1)
SODIUM: 141 mmol/L (ref 135–145)

## 2016-11-09 LAB — CULTURE, BLOOD (ROUTINE X 2)
CULTURE: NO GROWTH
Culture: NO GROWTH

## 2016-11-09 LAB — GLUCOSE, CAPILLARY
GLUCOSE-CAPILLARY: 143 mg/dL — AB (ref 65–99)
GLUCOSE-CAPILLARY: 92 mg/dL (ref 65–99)
GLUCOSE-CAPILLARY: 114 mg/dL — AB (ref 65–99)
Glucose-Capillary: 162 mg/dL — ABNORMAL HIGH (ref 65–99)

## 2016-11-09 NOTE — Progress Notes (Signed)
Patient ID: Ovidio KinScott Watkins, male   DOB: 09/19/1971, 45 y.o.   MRN: [id number] Patient is alert and oriented.  Vital signs stable. White count has nearly normalized.  Patient feels well and comfortable with transition to home on by mouth antibiotics  Procedure: Patient and myself performed irrigation and debridement of skin subcutaneous tissue right elbow. We both performed the dressing change which was an excisional debridement with curettes scissor and knife blade without difficulty. I instructed him on home wet-to-dry dressing changes and he demonstrated the ability to perform this himself. I spent greater than 45 minutes face-to-face time with him making sure that he understands the dressing changes and routine on the right arm. We will need to supply Curlex, gauze, saline, Ace wraps for twice daily dressing changes  We also performed dressing change to left elbow. He understands the proper technique with sterility.  He'll keep the arms covered for showers and I discussed with him the routine and how to do so.  Patient will need to perform the dressing changes twice a day and he understands this. He will not have to debride the wound as the wet-to-dry dressing will do the trick.  I will see him in my office in 1-1/2 weeks for suture removal.  Overall he looks quite well.  He tolerated the procedure bedside nicely.  Should any problems occur he'll notify me.  He should be ready for discharge tomorrow. He plans to stay with friends that are capable and trustworthy. I've asked him to run away from any IV drug abuser's and do not fall into the trap of recurrent IV drug use. He seems committed to this.  Once again last him to call our office to see me in 1-1/2 weeks. His cultures appear to be multi-organism at this juncture. Given the staff species isolated I would consider Bactrim DS for 3 weeks.  Mayjor Ager MD

## 2016-11-09 NOTE — Progress Notes (Signed)
PROGRESS NOTE    Jesus Watkins  UKG:[id number] DOB: 03-Sep-1971 DOA: 11/04/2016 PCP: No PCP Per Patient   Brief Narrative: Jesus Watkins is a 45 y.o. with a history of GERD and IV drug abuse. He presented with bilateral arm abscesses and found to also have compartment syndrome.   Assessment & Plan:   Active Problems:   Abscess of antecubital fossa   Hyperglycemia   IVDA (intravenous drug abuse) complicating pregnancy   Leukocytosis   Essential hypertension   Homeless   Cellulitis   Abscesses of arms Cellulitis Compartment syndrome Patient is s/p surgery 11/15 and 11/17. Afebrile -follow-up wound/blood cultures -orthopedic surgery recommendations -continue vancomycin/zosyn per pharmacy  IV drug abuse Heroine is drug of choice. UDS positive for amphetamines, benzodiazepines and opiates. HIV and RPR non reactive -follow-up blood cultures  Hyperglycemia -A1c pending  Hypertension -continue hydralazine prn  Homeless -CSW/CM consulted and met with patient.  Acute kidney injury I have a suspicion this is secondary to vancomycin toxicity. No vanc trough currently. Vancomycin decreased secondary to decreased creatinine clearance. -discussed with pharmacy and will get trough in AM -recheck BMP in AM   DVT prophylaxis: SCDs Code Status: Full code Family Communication: None at bedside Disposition Plan: Anticipate discharge after culture sensitivities and narrowing of antibiotics   Consultants:   Orthopedic surgery  Procedures:   Irrigation and debridement; fasciotomy, brachial artery/median nerve dissection; median nerve release (11/15)  Irrigation and debridement (11/17)  Antimicrobials:  Vancomycin (11/15>>   Zosyn (11/15>>    Subjective: No concerns today. Moving arms more.  Objective: Vitals:   11/07/16 2033 11/08/16 0451 11/08/16 1300 11/09/16 0459  BP: (!) 141/82 130/67 (!) 156/72 (!) 141/62  Pulse: 72 82 69 70  Resp: 18 18 18    Temp: 99 F  (37.2 C) 98.4 F (36.9 C) 98.3 F (36.8 C) 98.2 F (36.8 C)  TempSrc: Oral Oral Tympanic Oral  SpO2: 100% 98% 99% 99%  Weight:      Height:        Intake/Output Summary (Last 24 hours) at 11/09/16 1059 Last data filed at 11/09/16 0314  Gross per 24 hour  Intake              740 ml  Output              600 ml  Net              140 ml   Filed Weights   11/05/16 1251  Weight: 72.3 kg (159 lb 6.3 oz)    Examination:  General exam: Appears calm and comfortable Respiratory system: Clear to auscultation. Respiratory effort normal. Cardiovascular system: S1 & S2 heard, RRR. No murmurs, rubs, gallops or clicks. Gastrointestinal system: Abdomen is nondistended, soft and nontender. Normal bowel sounds heard. Central nervous system: Alert and oriented. No focal neurological deficits. Extremities: No edema. No calf tenderness. Bilateral arms wrapped with clean/dry dressings Skin: No cyanosis. No rashes Psychiatry: Judgement and insight appear normal. Mood & affect appropriate.     Data Reviewed: I have personally reviewed following labs and imaging studies  CBC:  Recent Labs Lab 11/04/16 1945 11/06/16 0445 11/08/16 0356  WBC 22.1* 13.1* 12.1*  NEUTROABS 18.3*  --   --   HGB 11.2* 9.4* 9.5*  HCT 34.4* 29.5* 29.7*  MCV 85.1 87.0 87.4  PLT 475* 360 762*   Basic Metabolic Panel:  Recent Labs Lab 11/04/16 1945 11/06/16 0445 11/08/16 0356 11/09/16 0550  NA 131* 136 139 141  K 3.7  3.6 3.6 4.2  CL 97* 102 105 106  CO2 24 27 25 25   GLUCOSE 137* 210* 141* 81  BUN 11 <5* 10 10  CREATININE 0.93 0.78 1.63* 1.72*  CALCIUM 8.5* 7.9* 8.2* 8.6*   GFR: Estimated Creatinine Clearance: 53 mL/min (by C-G formula based on SCr of 1.72 mg/dL (H)). Liver Function Tests:  Recent Labs Lab 11/04/16 1945  AST 19  ALT 12*  ALKPHOS 64  BILITOT 0.8  PROT 6.9  ALBUMIN 3.4*   No results for input(s): LIPASE, AMYLASE in the last 168 hours. No results for input(s): AMMONIA in the  last 168 hours. Coagulation Profile: No results for input(s): INR, PROTIME in the last 168 hours. Cardiac Enzymes: No results for input(s): CKTOTAL, CKMB, CKMBINDEX, TROPONINI in the last 168 hours. BNP (last 3 results) No results for input(s): PROBNP in the last 8760 hours. HbA1C: No results for input(s): HGBA1C in the last 72 hours. CBG:  Recent Labs Lab 11/08/16 0607 11/08/16 1144 11/08/16 1625 11/08/16 2033 11/09/16 0634  GLUCAP 111* 143* 94 128* 143*   Lipid Profile: No results for input(s): CHOL, HDL, LDLCALC, TRIG, CHOLHDL, LDLDIRECT in the last 72 hours. Thyroid Function Tests: No results for input(s): TSH, T4TOTAL, FREET4, T3FREE, THYROIDAB in the last 72 hours. Anemia Panel: No results for input(s): VITAMINB12, FOLATE, FERRITIN, TIBC, IRON, RETICCTPCT in the last 72 hours. Sepsis Labs:  Recent Labs Lab 11/04/16 2001  LATICACIDVEN 1.03    Recent Results (from the past 240 hour(s))  Culture, blood (Routine X 2)     Status: None (Preliminary result)   Collection Time: 11/04/16  7:45 PM  Result Value Ref Range Status   Specimen Description BLOOD RIGHT HAND  Final   Special Requests BOTTLES DRAWN AEROBIC AND ANAEROBIC 6CC  Final   Culture NO GROWTH 4 DAYS  Final   Report Status PENDING  Incomplete  Urine culture     Status: Abnormal   Collection Time: 11/04/16  7:47 PM  Result Value Ref Range Status   Specimen Description URINE, CLEAN CATCH  Final   Special Requests NONE  Final   Culture <10,000 COLONIES/mL INSIGNIFICANT GROWTH (A)  Final   Report Status 11/06/2016 FINAL  Final  Culture, blood (Routine X 2)     Status: None (Preliminary result)   Collection Time: 11/04/16  7:50 PM  Result Value Ref Range Status   Specimen Description BLOOD LEFT HAND  Final   Special Requests IN PEDIATRIC BOTTLE 4CC  Final   Culture NO GROWTH 4 DAYS  Final   Report Status PENDING  Incomplete  Aerobic/Anaerobic Culture (surgical/deep wound)     Status: None (Preliminary  result)   Collection Time: 11/05/16  7:23 AM  Result Value Ref Range Status   Specimen Description ABSCESS RIGHT ARM  Final   Special Requests PATIENT ON FOLLOWING  VANCOMYCIN  Final   Gram Stain   Final    ABUNDANT WBC PRESENT, PREDOMINANTLY PMN MODERATE GRAM NEGATIVE RODS MODERATE GRAM POSITIVE COCCI IN PAIRS AND CHAINS FEW GRAM POSITIVE COCCI IN CLUSTERS    Culture   Final    NORMAL SKIN FLORA NO ANAEROBES ISOLATED; CULTURE IN PROGRESS FOR 5 DAYS    Report Status PENDING  Incomplete  Aerobic/Anaerobic Culture (surgical/deep wound)     Status: None (Preliminary result)   Collection Time: 11/05/16  7:25 AM  Result Value Ref Range Status   Specimen Description ABSCESS LEFT ARM  Final   Special Requests PATIENT ON FOLLOWING VANCOMYCIN  Final  Gram Stain   Final    MODERATE WBC PRESENT, PREDOMINANTLY PMN FEW GRAM POSITIVE COCCI IN PAIRS RARE GRAM NEGATIVE RODS    Culture   Final    FEW STAPHYLOCOCCUS AUREUS NO ANAEROBES ISOLATED; CULTURE IN PROGRESS FOR 5 DAYS    Report Status PENDING  Incomplete         Radiology Studies: No results found.      Scheduled Meds: . folic acid  1 mg Oral Daily  . multivitamin with minerals  1 tablet Oral Daily  . piperacillin-tazobactam (ZOSYN)  IV  3.375 g Intravenous Q8H  . sodium chloride flush  3 mL Intravenous Q12H  . thiamine  100 mg Oral Daily  . vancomycin  1,250 mg Intravenous Q24H   Continuous Infusions:   LOS: 4 days     Cordelia Poche [facility] 11/09/2016, 10:59 AM Pager: [phone]  If 7PM-7AM, please contact night-coverage [email or web address] Password [email or web address] 11/09/2016, 10:59 AM

## 2016-11-09 NOTE — Progress Notes (Signed)
Pharmacy Antibiotic Note  Jesus Watkins is a 45 y.o. male admitted on 11/04/2016 with swollen bilateral arm + wrist. Pt was found to have bilateral abscesses in the antecubital region with compartment syndrome in his R arm. Abx for bilateral arm abscesses secondary to IV drug use. WBC 22.1 >>13.1>12.1, SCR 0.78.>1.63>1.72.  AF, normalized creat cl 56; last dose1250 gm given ~ 2 am S/p I&D x 2  L and R arms including upper arm, forearm, antecubital fossa w/ compartment release, decompression of abscess and tenolysis, tensosynovectomy. Plan to DC home on Monday on PO abx. Requested by Dr. Mal MistyNetty to check VT to determine if vancomycin had a role in renal insufficiency.   Vancomycin goal: 10-15 mcg/ml  Plan:- continue Zosyn 3.375 g IV q8h check vanco random/trough with am labs at 0500  (- I dc'd scheduled vanc  [1250 q24 (goal trough 10-15)] -Plan to DC tomorrow on PO abx  Temp (24hrs), Avg:98.3 F (36.8 C), Min:98.2 F (36.8 C), Max:98.3 F (36.8 C)   Recent Labs Lab 11/04/16 1945 11/04/16 2001 11/06/16 0445 11/08/16 0356 11/09/16 0550  WBC 22.1*  --  13.1* 12.1*  --   CREATININE 0.93  --  0.78 1.63* 1.72*  LATICACIDVEN  --  1.03  --   --   --     Estimated Creatinine Clearance: 53 mL/min (by C-G formula based on SCr of 1.72 mg/dL (H)).    No Known Allergies  11/15 vancomycin > 11/15 zosyn >   11/15 urine cx: insignificant growth 11/14 blood cx: ngtd 11/15 abscess cx left arm: few GPC pairs, rare GNR, few SA 11/15 abscess cx right arm: mod GNR, mod GPC pair, few GPC clusters - normal skin flora HIV NR, RPR NR  Thank you for allowing pharmacy to be a part of this patient's care.  Herby AbrahamMichelle T. Johnnye Sandford, Pharm.D. [phone] 11/09/2016 11:13 AM

## 2016-11-10 LAB — BASIC METABOLIC PANEL
Anion gap: 11 (ref 5–15)
BUN: 14 mg/dL (ref 6–20)
CHLORIDE: 104 mmol/L (ref 101–111)
CO2: 22 mmol/L (ref 22–32)
Calcium: 8.3 mg/dL — ABNORMAL LOW (ref 8.9–10.3)
Creatinine, Ser: 1.61 mg/dL — ABNORMAL HIGH (ref 0.61–1.24)
GFR calc Af Amer: 59 mL/min — ABNORMAL LOW (ref >60)
GFR calc non Af Amer: 51 mL/min — ABNORMAL LOW (ref >60)
GLUCOSE: 93 mg/dL (ref 65–99)
POTASSIUM: 4 mmol/L (ref 3.5–5.1)
Sodium: 137 mmol/L (ref 135–145)

## 2016-11-10 LAB — CBC
HEMATOCRIT: 28.5 % — AB (ref 39.0–52.0)
Hemoglobin: 9 g/dL — ABNORMAL LOW (ref 13.0–17.0)
MCH: 27.5 pg (ref 26.0–34.0)
MCHC: 31.6 g/dL (ref 30.0–36.0)
MCV: 87.2 fL (ref 78.0–100.0)
Platelets: 426 10*3/uL — ABNORMAL HIGH (ref 150–400)
RBC: 3.27 MIL/uL — ABNORMAL LOW (ref 4.22–5.81)
RDW: 13.8 % (ref 11.5–15.5)
WBC: 14.9 10*3/uL — ABNORMAL HIGH (ref 4.0–10.5)

## 2016-11-10 LAB — HEMOGLOBIN A1C
HEMOGLOBIN A1C: 6 % — AB (ref 4.8–5.6)
Mean Plasma Glucose: 126 mg/dL

## 2016-11-10 LAB — GLUCOSE, CAPILLARY
GLUCOSE-CAPILLARY: 95 mg/dL (ref 65–99)
Glucose-Capillary: 81 mg/dL (ref 65–99)

## 2016-11-10 LAB — VANCOMYCIN, RANDOM: Vancomycin Rm: 10

## 2016-11-10 MED ORDER — VANCOMYCIN HCL 10 G IV SOLR
1250.0000 mg | INTRAVENOUS | Status: DC
  Administered 2016-11-11: 1250 mg via INTRAVENOUS
  Filled 2016-11-10 (×2): qty 1250

## 2016-11-10 MED ORDER — SODIUM CHLORIDE 0.9 % IV SOLN
1250.0000 mg | Freq: Once | INTRAVENOUS | Status: AC
  Administered 2016-11-10: 1250 mg via INTRAVENOUS
  Filled 2016-11-10 (×2): qty 1250

## 2016-11-10 NOTE — Progress Notes (Signed)
PROGRESS NOTE    Jesus Watkins  ZOX:[id number] DOB: [birth year] DOA: 11/04/2016 PCP: No PCP Per Patient   Brief Narrative: Jesus Watkins is a 45 y.o. with a history of GERD and IV drug abuse. He presented with bilateral arm abscesses and found to also have compartment syndrome.   Assessment & Plan:   Active Problems:   Abscess of antecubital fossa   Hyperglycemia   IVDA (intravenous drug abuse) complicating pregnancy   Leukocytosis   Essential hypertension   Homeless   Cellulitis   Abscesses of arms Cellulitis Compartment syndrome Patient is s/p surgery 11/15 and 11/17. Afebrile -follow-up wound/blood cultures -orthopedic surgery recommendations -continue vancomycin/zosyn per pharmacy  IV drug abuse Heroine is drug of choice. UDS positive for amphetamines, benzodiazepines and opiates. HIV and RPR non reactive. Blood cultures no growth x5 days (final).  Prediabetes A1c 6.0 -follow-up with PCP  Hypertension -continue hydralazine prn  Homeless -CSW/CM consulted and met with patient.  Acute kidney injury Vancomycin trough subtherapeutic. Creatinine improved this morning slightly -recheck BMP in AM   DVT prophylaxis: SCDs Code Status: Full code Family Communication: None at bedside Disposition Plan: Anticipate discharge after culture sensitivities and narrowing of antibiotics   Consultants:   Orthopedic surgery  Procedures:   Irrigation and debridement; fasciotomy, brachial artery/median nerve dissection; median nerve release (11/15)  Irrigation and debridement (11/17)  Antimicrobials:  Vancomycin (11/15>>   Zosyn (11/15>>    Subjective: No concerns today  Objective: Vitals:   11/09/16 0459 11/09/16 1300 11/09/16 1923 11/10/16 0641  BP: (!) 141/62 (!) 171/88 (!) 150/62 (!) 151/74  Pulse: 70 79 71 65  Resp:      Temp: 98.2 F (36.8 C) 99 F (37.2 C) 98.2 F (36.8 C) 98.7 F (37.1 C)  TempSrc: Oral Oral Oral Oral  SpO2: 99% 100% 97% 100%    Weight:      Height:        Intake/Output Summary (Last 24 hours) at 11/10/16 1043 Last data filed at 11/10/16 0649  Gross per 24 hour  Intake              250 ml  Output              275 ml  Net              -25 ml   Filed Weights   11/05/16 1251  Weight: 72.3 kg (159 lb 6.3 oz)    Examination:  General exam: Appears calm and comfortable Respiratory system: Clear to auscultation. Respiratory effort normal. Cardiovascular system: S1 & S2 heard, RRR. No murmurs, rubs, gallops or clicks. Gastrointestinal system: Abdomen is nondistended, soft and nontender. Normal bowel sounds heard. Central nervous system: Alert and oriented. No focal neurological deficits. Extremities: No edema. No calf tenderness. Bilateral arms wrapped with clean/dry dressings Skin: No cyanosis. No rashes Psychiatry: Judgement and insight appear normal. Mood & affect appropriate.     Data Reviewed: I have personally reviewed following labs and imaging studies  CBC:  Recent Labs Lab 11/04/16 1945 11/06/16 0445 11/08/16 0356 11/10/16 0357  WBC 22.1* 13.1* 12.1* 14.9*  NEUTROABS 18.3*  --   --   --   HGB 11.2* 9.4* 9.5* 9.0*  HCT 34.4* 29.5* 29.7* 28.5*  MCV 85.1 87.0 87.4 87.2  PLT 475* 360 442* 811*   Basic Metabolic Panel:  Recent Labs Lab 11/04/16 1945 11/06/16 0445 11/08/16 0356 11/09/16 0550 11/10/16 0357  NA 131* 136 139 141 137  K 3.7 3.6 3.6 4.2  4.0  CL 97* 102 105 106 104  CO2 _0 GLUCOSE 137* 210* 141* 81 93  BUN 11 <5* _1 CREATININE 0.93 0.78 1.63* 1.72* 1.61*  CALCIUM 8.5* 7.9* 8.2* 8.6* 8.3*   GFR: Estimated Creatinine Clearance: 56.6 mL/min (by C-G formula based on SCr of 1.61 mg/dL (H)). Liver Function Tests:  Recent Labs Lab 11/04/16 1945  AST 19  ALT 12*  ALKPHOS 64  BILITOT 0.8  PROT 6.9  ALBUMIN 3.4*   No results for input(s): LIPASE, AMYLASE in the last 168 hours. No results for input(s): AMMONIA in the last 168 hours. Coagulation  Profile: No results for input(s): INR, PROTIME in the last 168 hours. Cardiac Enzymes: No results for input(s): CKTOTAL, CKMB, CKMBINDEX, TROPONINI in the last 168 hours. BNP (last 3 results) No results for input(s): PROBNP in the last 8760 hours. HbA1C:  Recent Labs  11/09/16 0550  HGBA1C 6.0*   CBG:  Recent Labs Lab 11/09/16 0634 11/09/16 1145 11/09/16 1722 11/09/16 2054 11/10/16 0646  GLUCAP 143* 162* 92 114* 81   Lipid Profile: No results for input(s): CHOL, HDL, LDLCALC, TRIG, CHOLHDL, LDLDIRECT in the last 72 hours. Thyroid Function Tests: No results for input(s): TSH, T4TOTAL, FREET4, T3FREE, THYROIDAB in the last 72 hours. Anemia Panel: No results for input(s): VITAMINB12, FOLATE, FERRITIN, TIBC, IRON, RETICCTPCT in the last 72 hours. Sepsis Labs:  Recent Labs Lab 11/04/16 2001  LATICACIDVEN 1.03    Recent Results (from the past 240 hour(s))  Culture, blood (Routine X 2)     Status: None   Collection Time: 11/04/16  7:45 PM  Result Value Ref Range Status   Specimen Description BLOOD RIGHT HAND  Final   Special Requests BOTTLES DRAWN AEROBIC AND ANAEROBIC 6CC  Final   Culture NO GROWTH 5 DAYS  Final   Report Status 11/09/2016 FINAL  Final  Urine culture     Status: Abnormal   Collection Time: 11/04/16  7:47 PM  Result Value Ref Range Status   Specimen Description URINE, CLEAN CATCH  Final   Special Requests NONE  Final   Culture <10,000 COLONIES/mL INSIGNIFICANT GROWTH (A)  Final   Report Status 11/06/2016 FINAL  Final  Culture, blood (Routine X 2)     Status: None   Collection Time: 11/04/16  7:50 PM  Result Value Ref Range Status   Specimen Description BLOOD LEFT HAND  Final   Special Requests IN PEDIATRIC BOTTLE 4CC  Final   Culture NO GROWTH 5 DAYS  Final   Report Status 11/09/2016 FINAL  Final  Aerobic/Anaerobic Culture (surgical/deep wound)     Status: None (Preliminary result)   Collection Time: 11/05/16  7:23 AM  Result Value Ref Range  Status   Specimen Description ABSCESS RIGHT ARM  Final   Special Requests PATIENT ON FOLLOWING  VANCOMYCIN  Final   Gram Stain   Final    ABUNDANT WBC PRESENT, PREDOMINANTLY PMN MODERATE GRAM NEGATIVE RODS MODERATE GRAM POSITIVE COCCI IN PAIRS AND CHAINS FEW GRAM POSITIVE COCCI IN CLUSTERS    Culture   Final    NORMAL SKIN FLORA NO ANAEROBES ISOLATED; CULTURE IN PROGRESS FOR 5 DAYS    Report Status PENDING  Incomplete  Aerobic/Anaerobic Culture (surgical/deep wound)     Status: None (Preliminary result)   Collection Time: 11/05/16  7:25 AM  Result Value Ref Range Status   Specimen Description ABSCESS LEFT ARM  Final   Special Requests PATIENT ON FOLLOWING VANCOMYCIN  Final   Gram Stain   Final    MODERATE WBC PRESENT, PREDOMINANTLY PMN FEW GRAM POSITIVE COCCI IN PAIRS RARE GRAM NEGATIVE RODS    Culture   Final    FEW STAPHYLOCOCCUS AUREUS NO ANAEROBES ISOLATED; CULTURE IN PROGRESS FOR 5 DAYS    Report Status PENDING  Incomplete         Radiology Studies: No results found.      Scheduled Meds: . folic acid  1 mg Oral Daily  . multivitamin with minerals  1 tablet Oral Daily  . piperacillin-tazobactam (ZOSYN)  IV  3.375 g Intravenous Q8H  . sodium chloride flush  3 mL Intravenous Q12H  . thiamine  100 mg Oral Daily   Continuous Infusions:   LOS: 5 days     Cordelia Poche [facility] 11/10/2016, 10:43 AM Pager: [phone]  If 7PM-7AM, please contact night-coverage [email or web address] Password [facility] 11/10/2016, 10:43 AM

## 2016-11-10 NOTE — Progress Notes (Signed)
Pharmacy Antibiotic Note  Jesus Watkins is a 45 y.o. male admitted on 11/04/2016 with swollen bilateral arm + wrist. Pt was found to have bilateral abscesses in the antecubital region with compartment syndrome in his R arm.  Requested by Dr. Mal MistyNetty to check VT to determine if vancomycin had a role in renal insufficiency. Vanc level this am ~26hr after last dose is 10, indicating vanc has not been accumulating.  Vancomycin goal: 10-15 mcg/ml  Plan: Will give another dose of vancomycin 1250mg  x1, plan to transition to PO for discharge, will f/u if postponed.  Temp (24hrs), Avg:98.5 F (36.9 C), Min:98.2 F (36.8 C), Max:99 F (37.2 C)   Recent Labs Lab 11/04/16 1945 11/04/16 2001 11/06/16 0445 11/08/16 0356 11/09/16 0550 11/10/16 0357  WBC 22.1*  --  13.1* 12.1*  --  14.9*  CREATININE 0.93  --  0.78 1.63* 1.72* 1.61*  LATICACIDVEN  --  1.03  --   --   --   --   VANCORANDOM  --   --   --   --   --  10    Estimated Creatinine Clearance: 56.6 mL/min (by C-G formula based on SCr of 1.61 mg/dL (H)).    No Known Allergies  11/15 vancomycin > 11/15 zosyn >   11/15 urine cx: insignificant growth 11/14 blood cx: ngtd 11/15 abscess cx left arm: few GPC pairs, rare GNR, few SA 11/15 abscess cx right arm: mod GNR, mod GPC pair, few GPC clusters - normal skin flora HIV NR, RPR NR  Thank you for allowing pharmacy to be a part of this patient's care.  Vernard GamblesVeronda Cordarius Benning, PharmD, BCPS  11/10/2016 4:55 AM

## 2016-11-11 LAB — AEROBIC/ANAEROBIC CULTURE W GRAM STAIN (SURGICAL/DEEP WOUND)

## 2016-11-11 LAB — AEROBIC/ANAEROBIC CULTURE (SURGICAL/DEEP WOUND)

## 2016-11-11 LAB — GLUCOSE, CAPILLARY: GLUCOSE-CAPILLARY: 85 mg/dL (ref 65–99)

## 2016-11-11 MED ORDER — DOXYCYCLINE HYCLATE 100 MG PO CAPS: 100.0000 mg | ORAL_CAPSULE | Freq: Two times a day (BID) | ORAL | 0 refills | Status: AC

## 2016-11-11 MED ORDER — THIAMINE HCL 100 MG PO TABS: 100.0000 mg | ORAL_TABLET | Freq: Every day | ORAL | 0 refills | Status: DC

## 2016-11-11 MED ORDER — FOLIC ACID 1 MG PO TABS: 1.0000 mg | ORAL_TABLET | Freq: Every day | ORAL | 0 refills | Status: DC

## 2016-11-11 MED ORDER — OXYCODONE HCL 5 MG PO TABS: 5.0000 mg | ORAL_TABLET | Freq: Four times a day (QID) | ORAL | 0 refills | Status: DC | PRN

## 2016-11-11 MED ORDER — ADULT MULTIVITAMIN W/MINERALS CH: 1.0000 | ORAL_TABLET | Freq: Every day | ORAL | 0 refills | Status: DC

## 2016-11-11 NOTE — Discharge Summary (Signed)
Physician Discharge Summary  Jesus Watkins BMW:[id number] DOB: 02/22/1971 DOA: 11/04/2016  PCP: No PCP Per Patient  Admit date: 11/04/2016 Discharge date: 11/12/2016  Admitted From: Home Disposition:  Home  Recommendations for Outpatient Follow-up:  1. Follow up with PCP in 1 week (Patient met with Case manager regarding PCP follow-up) 2. Follow up with hand surgery for wound check 3. Repeat CBC and BMP in 1 week   Discharge Condition: Stable CODE STATUS: Full code  Brief/Interim Summary:  HPI written by Dr. Linna Darner on 11/05/2016  HPI: Jesus Watkins is a 45 y.o. male with medical history significant of GERD, and IVDA.   Level 5 caveat applies. Pt continues to be very sleepy postoperatively and unable to provide great details surounding his current condition. History obtained  From pts chart from EDP and orthopedics notes, and on a limited basis by pt. L arm pain. Constant and getting worse. Started over days ago. Developed right arm pain and swelling approximately one day ago. Left arm developed a boil which then opened up and has been using. Right arm abscess is not opened and drained yet. Patient endorses IV drug use, heroin. Last use was approximately 36 hours ago. Does not use sterile technique per patient. At time of my examination patient denied any recent chest pain, palpitations, shortness of breath, abdominal pain, diarrhea, vomiting, dysuria, frequency, neck stiffness, headache, fevers.   ED Course: started on Vanc/Zosyn and taken to OR by Dr. Amedeo Plenty for washout   Hospital course:  Abscesses of arms Cellulitis Compartment syndrome Patient underwent surgeries on 11/15 and 11/17 for I&D abscesses. He was started on Vancomycin and Zosyn empirically. Wound cultures significant for MSSA. Blood cultures negative. Afebrile. Dressing changes handled by patient after education by hand surgery. He was transitioned to doxycycline at discharge.  IV drug abuse Heroine is  drug of choice. UDS positive for amphetamines, benzodiazepines and opiates. HIV and RPR non reactive. Blood cultures no growth x5 days (final).  Prediabetes A1c 6.0  Hypertension Hydralazine prn. Slightly controlled. Recommend follow-up for possible initiation of antihypertensives.  Homeless CSW/CM consulted and met with patient.  Acute kidney injury Mild elevation. Patient on vancomycin but trough was actually subtherapeutic. Improved with fluids.  Discharge Diagnoses:  Active Problems:   Abscess of antecubital fossa   Hyperglycemia   IVDA (intravenous drug abuse) complicating pregnancy   Leukocytosis   Essential hypertension   Homeless   Cellulitis    Discharge Instructions     Medication List    TAKE these medications   doxycycline 100 MG capsule Commonly known as:  VIBRAMYCIN Take 1 capsule (100 mg total) by mouth 2 (two) times daily.   folic acid 1 MG tablet Commonly known as:  FOLVITE Take 1 tablet (1 mg total) by mouth daily.   multivitamin with minerals Tabs tablet Take 1 tablet by mouth daily.   oxyCODONE 5 MG immediate release tablet Commonly known as:  Oxy IR/ROXICODONE Take 1 tablet (5 mg total) by mouth every 6 (six) hours as needed for moderate pain or severe pain.   thiamine 100 MG tablet Take 1 tablet (100 mg total) by mouth daily.      Follow-up Information    Paulene Floor, MD Follow up.   Specialty:  Orthopedic Surgery Contact information: [address] [place] [address] [phone]          No Known Allergies  Consultations:  Orthopedic surgery   Procedures/Studies: Dg Chest 2 View  Result Date: 11/04/2016 CLINICAL  DATA:  Concern for infection. Fever, with bilateral forearm abscesses. Initial encounter. EXAM: CHEST  2 VIEW COMPARISON:  None. FINDINGS: The lungs are well-aerated. Mild peribronchial thickening is noted. There is no evidence of focal opacification, pleural effusion or  pneumothorax. The heart is normal in size; the mediastinal contour is within normal limits. No acute osseous abnormalities are seen. There is mild chronic deformity of the left lateral seventh rib. IMPRESSION: Mild peribronchial thickening noted.  Lungs otherwise clear. Electronically Signed   By: Garald Balding M.D.   On: 11/04/2016 21:23   Ct [place] Right W Contrast  Result Date: 11/05/2016 CLINICAL DATA:  Acute onset of right arm infection. Assess for abscess. EXAM: CT OF THE RIGHT ELBOW WITH CONTRAST TECHNIQUE: Multidetector CT imaging was performed following the standard protocol during bolus administration of intravenous contrast. CONTRAST:  75 mL ISOVUE-300 IOPAMIDOL (ISOVUE-300) INJECTION 61% COMPARISON:  None. FINDINGS: There is a large complex multiloculated abscess at the antecubital fossa, measuring approximately 4.8 x 4.7 x 3.6 cm. Mild diffuse soft tissue edema is noted involving the visualized distal upper arm and proximal forearm. The visualized vasculature is grossly unremarkable, though the abscess obscures evaluation of venous vasculature in this region. Soft tissue edema tracks about the musculature. No acute osseous abnormalities are seen. There is no evidence of osseous erosion. No significant elbow joint effusion is identified. IMPRESSION: 1. Large complex multiloculated abscess at the antecubital fossa, measuring 4.8 x 4.7 x 3.6 cm. 2. Mild diffuse soft tissue edema involving the visualized distal upper arm and proximal forearm. Electronically Signed   By: Garald Balding M.D.   On: 11/05/2016 02:54     Irrigation and debridement; fasciotomy, brachial artery/median nerve dissection; median nerve release (11/15)  Irrigation and debridement (11/17)   Subjective: Patient reports no issues this morning.  Discharge Exam: Vitals:   11/11/16 0647 11/11/16 1500  BP: 130/63 (!) 148/62  Pulse: 69 68  Resp: 16 16  Temp: 99.1 F (37.3 C) 98.9 F (37.2 C)   Vitals:   11/10/16  1800 11/10/16 2013 11/11/16 0647 11/11/16 1500  BP: 140/75 (!) 161/81 130/63 (!) 148/62  Pulse: 85 71 69 68  Resp: _0 Temp: 98.9 F (37.2 C) 99.9 F (37.7 C) 99.1 F (37.3 C) 98.9 F (37.2 C)  TempSrc: Oral Oral Oral Oral  SpO2: 95% 99% 98% 98%  Weight:      Height:        General exam: Appears calm and comfortable Respiratory system: Clear to auscultation. Respiratory effort normal. Cardiovascular system: S1 & S2 heard, RRR. No murmurs, rubs, gallops or clicks. Gastrointestinal system: Abdomen is nondistended, soft and nontender. Normal bowel sounds heard. Central nervous system: Alert and oriented. No focal neurological deficits. Extremities: No edema. No calf tenderness. Bilateral arms wrapped with clean/dry dressings Skin: No cyanosis. No rashes Psychiatry: Judgement and insight appear normal. Mood & affect appropriate.    The results of significant diagnostics from this hospitalization (including imaging, microbiology, ancillary and laboratory) are listed below for reference.     Microbiology: Recent Results (from the past 240 hour(s))  Culture, blood (Routine X 2)     Status: None   Collection Time: 11/04/16  7:45 PM  Result Value Ref Range Status   Specimen Description BLOOD RIGHT HAND  Final   Special Requests BOTTLES DRAWN AEROBIC AND ANAEROBIC [place]  Final   Culture NO GROWTH 5 DAYS  Final   Report Status 11/09/2016 FINAL  Final  Urine culture  Status: Abnormal   Collection Time: 11/04/16  7:47 PM  Result Value Ref Range Status   Specimen Description URINE, CLEAN CATCH  Final   Special Requests NONE  Final   Culture <10,000 COLONIES/mL INSIGNIFICANT GROWTH (A)  Final   Report Status 11/06/2016 FINAL  Final  Culture, blood (Routine X 2)     Status: None   Collection Time: 11/04/16  7:50 PM  Result Value Ref Range Status   Specimen Description BLOOD LEFT HAND  Final   Special Requests IN PEDIATRIC BOTTLE 4CC  Final   Culture NO GROWTH 5 DAYS  Final    Report Status 11/09/2016 FINAL  Final  Aerobic/Anaerobic Culture (surgical/deep wound)     Status: None   Collection Time: 11/05/16  7:23 AM  Result Value Ref Range Status   Specimen Description ABSCESS RIGHT ARM  Final   Special Requests PATIENT ON FOLLOWING  VANCOMYCIN  Final   Gram Stain   Final    ABUNDANT WBC PRESENT, PREDOMINANTLY PMN MODERATE GRAM NEGATIVE RODS MODERATE GRAM POSITIVE COCCI IN PAIRS AND CHAINS FEW GRAM POSITIVE COCCI IN CLUSTERS    Culture   Final    ABUNDANT PREVOTELLA SPECIES BETA LACTAMASE POSITIVE NORMAL SKIN FLORA AEROBICALLY    Report Status 11/11/2016 FINAL  Final  Aerobic/Anaerobic Culture (surgical/deep wound)     Status: None   Collection Time: 11/05/16  7:25 AM  Result Value Ref Range Status   Specimen Description ABSCESS LEFT ARM  Final   Special Requests PATIENT ON FOLLOWING VANCOMYCIN  Final   Gram Stain   Final    MODERATE WBC PRESENT, PREDOMINANTLY PMN FEW GRAM POSITIVE COCCI IN PAIRS RARE GRAM NEGATIVE RODS    Culture FEW STAPHYLOCOCCUS AUREUS NO ANAEROBES ISOLATED   Final   Report Status 11/11/2016 FINAL  Final   Organism ID, Bacteria STAPHYLOCOCCUS AUREUS  Final      Susceptibility   Staphylococcus aureus - MIC*    CIPROFLOXACIN <=0.5 SENSITIVE Sensitive     ERYTHROMYCIN <=0.25 SENSITIVE Sensitive     GENTAMICIN <=0.5 SENSITIVE Sensitive     OXACILLIN <=0.25 SENSITIVE Sensitive     TETRACYCLINE <=1 SENSITIVE Sensitive     VANCOMYCIN 1 SENSITIVE Sensitive     TRIMETH/SULFA <=10 SENSITIVE Sensitive     CLINDAMYCIN <=0.25 SENSITIVE Sensitive     RIFAMPIN <=0.5 SENSITIVE Sensitive     Inducible Clindamycin NEGATIVE Sensitive     * FEW STAPHYLOCOCCUS AUREUS     Labs: BNP (last 3 results) No results for input(s): BNP in the last 8760 hours. Basic Metabolic Panel:  Recent Labs Lab 11/06/16 0445 11/08/16 0356 11/09/16 0550 11/10/16 0357  NA 136 139 141 137  K 3.6 3.6 4.2 4.0  CL 102 105 106 104  CO2 _0 GLUCOSE 210* 141* 81 93  BUN <5* _1 CREATININE 0.78 1.63* 1.72* 1.61*  CALCIUM 7.9* 8.2* 8.6* 8.3*   Liver Function Tests: No results for input(s): AST, ALT, ALKPHOS, BILITOT, PROT, ALBUMIN in the last 168 hours. No results for input(s): LIPASE, AMYLASE in the last 168 hours. No results for input(s): AMMONIA in the last 168 hours. CBC:  Recent Labs Lab 11/06/16 0445 11/08/16 0356 11/10/16 0357  WBC 13.1* 12.1* 14.9*  HGB 9.4* 9.5* 9.0*  HCT 29.5* 29.7* 28.5*  MCV 87.0 87.4 87.2  PLT 360 442* 426*   Cardiac Enzymes: No results for input(s): CKTOTAL, CKMB, CKMBINDEX, TROPONINI in the last 168 hours. BNP: Invalid input(s): POCBNP CBG:  Recent Labs Lab 11/09/16 1722 11/09/16 2054 11/10/16 0646 11/10/16 2016 11/11/16 0656  GLUCAP 92 114* 81 95 85   D-Dimer No results for input(s): DDIMER in the last 72 hours. Hgb A1c No results for input(s): HGBA1C in the last 72 hours. Lipid Profile No results for input(s): CHOL, HDL, LDLCALC, TRIG, CHOLHDL, LDLDIRECT in the last 72 hours. Thyroid function studies No results for input(s): TSH, T4TOTAL, T3FREE, THYROIDAB in the last 72 hours.  Invalid input(s): FREET3 Anemia work up No results for input(s): VITAMINB12, FOLATE, FERRITIN, TIBC, IRON, RETICCTPCT in the last 72 hours. Urinalysis    Component Value Date/Time   COLORURINE AMBER (A) 11/04/2016 1947   APPEARANCEUR CLEAR 11/04/2016 1947   LABSPEC 1.027 11/04/2016 1947   PHURINE 6.0 11/04/2016 1947   GLUCOSEU NEGATIVE 11/04/2016 [place] NEGATIVE 11/04/2016 [place] (A) 11/04/2016 1947   KETONESUR 15 (A) 11/04/2016 1947   PROTEINUR NEGATIVE 11/04/2016 1947   NITRITE NEGATIVE 11/04/2016 1947   LEUKOCYTESUR NEGATIVE 11/04/2016 1947   Sepsis Labs Invalid input(s): PROCALCITONIN,  WBC,  LACTICIDVEN Microbiology Recent Results (from the past 240 hour(s))  Culture, blood (Routine X 2)     Status: None   Collection Time: 11/04/16  7:45 PM   Result Value Ref Range Status   Specimen Description BLOOD RIGHT HAND  Final   Special Requests BOTTLES DRAWN AEROBIC AND ANAEROBIC [place]  Final   Culture NO GROWTH 5 DAYS  Final   Report Status 11/09/2016 FINAL  Final  Urine culture     Status: Abnormal   Collection Time: 11/04/16  7:47 PM  Result Value Ref Range Status   Specimen Description URINE, CLEAN CATCH  Final   Special Requests NONE  Final   Culture <10,000 COLONIES/mL INSIGNIFICANT GROWTH (A)  Final   Report Status 11/06/2016 FINAL  Final  Culture, blood (Routine X 2)     Status: None   Collection Time: 11/04/16  7:50 PM  Result Value Ref Range Status   Specimen Description BLOOD LEFT HAND  Final   Special Requests IN PEDIATRIC BOTTLE 4CC  Final   Culture NO GROWTH 5 DAYS  Final   Report Status 11/09/2016 FINAL  Final  Aerobic/Anaerobic Culture (surgical/deep wound)     Status: None   Collection Time: 11/05/16  7:23 AM  Result Value Ref Range Status   Specimen Description ABSCESS RIGHT ARM  Final   Special Requests PATIENT ON FOLLOWING  VANCOMYCIN  Final   Gram Stain   Final    ABUNDANT WBC PRESENT, PREDOMINANTLY PMN MODERATE GRAM NEGATIVE RODS MODERATE GRAM POSITIVE COCCI IN PAIRS AND CHAINS FEW GRAM POSITIVE COCCI IN CLUSTERS    Culture   Final    ABUNDANT PREVOTELLA SPECIES BETA LACTAMASE POSITIVE NORMAL SKIN FLORA AEROBICALLY    Report Status 11/11/2016 FINAL  Final  Aerobic/Anaerobic Culture (surgical/deep wound)     Status: None   Collection Time: 11/05/16  7:25 AM  Result Value Ref Range Status   Specimen Description ABSCESS LEFT ARM  Final   Special Requests PATIENT ON FOLLOWING VANCOMYCIN  Final   Gram Stain   Final    MODERATE WBC PRESENT, PREDOMINANTLY PMN FEW GRAM POSITIVE COCCI IN PAIRS RARE GRAM NEGATIVE RODS    Culture FEW STAPHYLOCOCCUS AUREUS NO ANAEROBES ISOLATED   Final   Report Status 11/11/2016 FINAL  Final   Organism ID, Bacteria STAPHYLOCOCCUS AUREUS  Final      Susceptibility    Staphylococcus aureus - MIC*  CIPROFLOXACIN <=0.5 SENSITIVE Sensitive     ERYTHROMYCIN <=0.25 SENSITIVE Sensitive     GENTAMICIN <=0.5 SENSITIVE Sensitive     OXACILLIN <=0.25 SENSITIVE Sensitive     TETRACYCLINE <=1 SENSITIVE Sensitive     VANCOMYCIN 1 SENSITIVE Sensitive     TRIMETH/SULFA <=10 SENSITIVE Sensitive     CLINDAMYCIN <=0.25 SENSITIVE Sensitive     RIFAMPIN <=0.5 SENSITIVE Sensitive     Inducible Clindamycin NEGATIVE Sensitive     * FEW STAPHYLOCOCCUS AUREUS     Time coordinating discharge: Over 30 minutes  SIGNED:   Cordelia Poche, MD [facility] 11/12/2016, 1:28 PM Pager [phone]  If 7PM-7AM, please contact night-coverage [email or web address] Password [email or web address]

## 2016-11-11 NOTE — Care Management (Signed)
Case manager spoke with patient concerning discharge plan. He states he "has someone to take him where he is going" . No specifics provided on destination. CM provided patient with supplies for wound care and dressing changes. He has been instructed and has redemonstrated to MD and to bedside RN how to properly care for his wounds.

## 2016-11-11 NOTE — Progress Notes (Signed)
Discharge instructions and prescriptions given to patient and patient stated understanding. Nurse also watched patient change dressings and patient understands his needs to comply and to do these changes daily. He also has supplies in his room to take with him to change them. He states understanding of the importance to call to make his follow up appointments. IV removed. Patient is waiting for transportation Industrial/product designerChristilla A Whitni Pasquini, RN

## 2016-11-11 NOTE — Discharge Instructions (Signed)
Mr. Jesus Watkins, you were admitted because of a skin infection. You are going home on antibiotics. Please take as directed. Also, please follow Dr. Carlos LeveringGramig's instructions for your wound care.   Please perform wet-to-dry dressing changes twice a day as instructed by Dr. Amanda PeaGramig.  Please keep the right and left elbows clean and dry.  Keep bandage clean and dry.  Call for any problems.  No smoking.  Criteria for driving a car: you should be off your pain medicine for 7-8 hours, able to drive one handed(confident), thinking clearly and feeling able in your judgement to drive. Continue elevation as it will decrease swelling.  If instructed by MD move your fingers within the confines of the bandage/splint.  Use ice if instructed by your MD. Call immediately for any sudden loss of feeling in your hand/arm or change in functional abilities of the extremity.We recommend that you to take vitamin C 1000 mg a day to promote healing. We also recommend that if you require  pain medicine that you take a stool softener to prevent constipation as most pain medicines will have constipation side effects. We recommend either Peri-Colace or Senokot and recommend that you also consider adding MiraLAX as well to prevent the constipation affects from pain medicine if you are required to use them. These medicines are over the counter and may be purchased at a local pharmacy. A cup of yogurt and a probiotic can also be helpful during the recovery process as the medicines can disrupt your intestinal environment.

## 2016-11-21 DIAGNOSIS — I639 Cerebral infarction, unspecified: Secondary | ICD-10-CM

## 2016-11-21 HISTORY — DX: Cerebral infarction, unspecified: I63.9

## 2016-12-13 ENCOUNTER — Inpatient Hospital Stay (HOSPITAL_COMMUNITY)
Admission: EM | Admit: 2016-12-13 | Discharge: 2016-12-23 | DRG: 871 | Disposition: A | Discharge status: Skilled Nursing Facility | Payer: Self-pay | Attending: Internal Medicine | Admitting: Internal Medicine

## 2016-12-13 ENCOUNTER — Emergency Department (HOSPITAL_COMMUNITY): Payer: Self-pay

## 2016-12-13 ENCOUNTER — Encounter (HOSPITAL_COMMUNITY): Payer: Self-pay

## 2016-12-13 DIAGNOSIS — K219 Gastro-esophageal reflux disease without esophagitis: Secondary | ICD-10-CM | POA: Diagnosis present

## 2016-12-13 DIAGNOSIS — J189 Pneumonia, unspecified organism: Secondary | ICD-10-CM | POA: Diagnosis present

## 2016-12-13 DIAGNOSIS — R6520 Severe sepsis without septic shock: Secondary | ICD-10-CM

## 2016-12-13 DIAGNOSIS — F1193 Opioid use, unspecified with withdrawal: Secondary | ICD-10-CM

## 2016-12-13 DIAGNOSIS — I6340 Cerebral infarction due to embolism of unspecified cerebral artery: Secondary | ICD-10-CM | POA: Diagnosis present

## 2016-12-13 DIAGNOSIS — I10 Essential (primary) hypertension: Secondary | ICD-10-CM | POA: Diagnosis present

## 2016-12-13 DIAGNOSIS — Z590 Homelessness: Secondary | ICD-10-CM

## 2016-12-13 DIAGNOSIS — E860 Dehydration: Secondary | ICD-10-CM | POA: Diagnosis present

## 2016-12-13 DIAGNOSIS — N179 Acute kidney failure, unspecified: Secondary | ICD-10-CM | POA: Diagnosis present

## 2016-12-13 DIAGNOSIS — R739 Hyperglycemia, unspecified: Secondary | ICD-10-CM | POA: Diagnosis present

## 2016-12-13 DIAGNOSIS — R6521 Severe sepsis with septic shock: Secondary | ICD-10-CM | POA: Diagnosis present

## 2016-12-13 DIAGNOSIS — E872 Acidosis: Secondary | ICD-10-CM | POA: Diagnosis present

## 2016-12-13 DIAGNOSIS — F1110 Opioid abuse, uncomplicated: Secondary | ICD-10-CM

## 2016-12-13 DIAGNOSIS — A419 Sepsis, unspecified organism: Principal | ICD-10-CM | POA: Diagnosis present

## 2016-12-13 DIAGNOSIS — F17210 Nicotine dependence, cigarettes, uncomplicated: Secondary | ICD-10-CM | POA: Diagnosis present

## 2016-12-13 DIAGNOSIS — I330 Acute and subacute infective endocarditis: Secondary | ICD-10-CM | POA: Diagnosis present

## 2016-12-13 DIAGNOSIS — F1123 Opioid dependence with withdrawal: Secondary | ICD-10-CM | POA: Diagnosis present

## 2016-12-13 DIAGNOSIS — I6389 Other cerebral infarction: Secondary | ICD-10-CM

## 2016-12-13 DIAGNOSIS — Y95 Nosocomial condition: Secondary | ICD-10-CM | POA: Diagnosis present

## 2016-12-13 DIAGNOSIS — H543 Unqualified visual loss, both eyes: Secondary | ICD-10-CM | POA: Diagnosis present

## 2016-12-13 LAB — DIFFERENTIAL
BASOS ABS: 0 10*3/uL (ref 0.0–0.1)
Basophils Relative: 0 %
EOS PCT: 0 %
Eosinophils Absolute: 0 10*3/uL (ref 0.0–0.7)
LYMPHS ABS: 0.3 10*3/uL — AB (ref 0.7–4.0)
Lymphocytes Relative: 2 %
MONO ABS: 0.1 10*3/uL (ref 0.1–1.0)
MONOS PCT: 1 %
NEUTROS PCT: 97 %
Neutro Abs: 14.3 10*3/uL — ABNORMAL HIGH (ref 1.7–7.7)
WBC MORPHOLOGY: INCREASED

## 2016-12-13 LAB — CBC
HEMATOCRIT: 34.9 % — AB (ref 39.0–52.0)
HEMOGLOBIN: 11.1 g/dL — AB (ref 13.0–17.0)
MCH: 27.3 pg (ref 26.0–34.0)
MCHC: 31.8 g/dL (ref 30.0–36.0)
MCV: 85.7 fL (ref 78.0–100.0)
Platelets: 228 10*3/uL (ref 150–400)
RBC: 4.07 MIL/uL — ABNORMAL LOW (ref 4.22–5.81)
RDW: 14.7 % (ref 11.5–15.5)
WBC: 14.7 10*3/uL — AB (ref 4.0–10.5)

## 2016-12-13 LAB — URINALYSIS, ROUTINE W REFLEX MICROSCOPIC
Bilirubin Urine: NEGATIVE
Glucose, UA: NEGATIVE mg/dL
KETONES UR: NEGATIVE mg/dL
LEUKOCYTES UA: NEGATIVE
Nitrite: NEGATIVE
PROTEIN: 100 mg/dL — AB
Specific Gravity, Urine: 1.034 — ABNORMAL HIGH (ref 1.005–1.030)
pH: 5 (ref 5.0–8.0)

## 2016-12-13 LAB — COMPREHENSIVE METABOLIC PANEL
ALBUMIN: 3.2 g/dL — AB (ref 3.5–5.0)
ALT: 39 U/L (ref 17–63)
ANION GAP: 14 (ref 5–15)
AST: 120 U/L — AB (ref 15–41)
Alkaline Phosphatase: 235 U/L — ABNORMAL HIGH (ref 38–126)
BUN: 21 mg/dL — AB (ref 6–20)
CO2: 22 mmol/L (ref 22–32)
Calcium: 8.9 mg/dL (ref 8.9–10.3)
Chloride: 99 mmol/L — ABNORMAL LOW (ref 101–111)
Creatinine, Ser: 1.74 mg/dL — ABNORMAL HIGH (ref 0.61–1.24)
GFR calc Af Amer: 53 mL/min — ABNORMAL LOW (ref >60)
GFR calc non Af Amer: 46 mL/min — ABNORMAL LOW (ref >60)
GLUCOSE: 158 mg/dL — AB (ref 65–99)
POTASSIUM: 4.1 mmol/L (ref 3.5–5.1)
SODIUM: 135 mmol/L (ref 135–145)
Total Bilirubin: 0.8 mg/dL (ref 0.3–1.2)
Total Protein: 6.6 g/dL (ref 6.5–8.1)

## 2016-12-13 LAB — PROTIME-INR
INR: 1.25
Prothrombin Time: 15.8 seconds — ABNORMAL HIGH (ref 11.4–15.2)

## 2016-12-13 LAB — I-STAT CHEM 8, ED
BUN: 22 mg/dL — AB (ref 6–20)
CHLORIDE: 99 mmol/L — AB (ref 101–111)
CREATININE: 1.4 mg/dL — AB (ref 0.61–1.24)
Calcium, Ion: 1.07 mmol/L — ABNORMAL LOW (ref 1.15–1.40)
Glucose, Bld: 161 mg/dL — ABNORMAL HIGH (ref 65–99)
HEMATOCRIT: 33 % — AB (ref 39.0–52.0)
Hemoglobin: 11.2 g/dL — ABNORMAL LOW (ref 13.0–17.0)
POTASSIUM: 4.1 mmol/L (ref 3.5–5.1)
SODIUM: 135 mmol/L (ref 135–145)
TCO2: 23 mmol/L (ref 0–100)

## 2016-12-13 LAB — APTT: APTT: 30 s (ref 24–36)

## 2016-12-13 LAB — I-STAT CG4 LACTIC ACID, ED: Lactic Acid, Venous: 5.02 mmol/L (ref 0.5–1.9)

## 2016-12-13 LAB — I-STAT TROPONIN, ED: Troponin i, poc: 0.01 ng/mL (ref 0.00–0.08)

## 2016-12-13 LAB — LACTIC ACID, PLASMA: LACTIC ACID, VENOUS: 2.4 mmol/L — AB (ref 0.5–1.9)

## 2016-12-13 MED ORDER — SODIUM CHLORIDE 0.9 % IV BOLUS (SEPSIS)
1000.0000 mL | Freq: Once | INTRAVENOUS | Status: AC
  Administered 2016-12-13: 1000 mL via INTRAVENOUS

## 2016-12-13 MED ORDER — SODIUM CHLORIDE 0.9 % IV SOLN
Freq: Once | INTRAVENOUS | Status: AC
  Administered 2016-12-13: 21:00:00 via INTRAVENOUS

## 2016-12-13 MED ORDER — VANCOMYCIN HCL IN DEXTROSE 1-5 GM/200ML-% IV SOLN
1000.0000 mg | Freq: Once | INTRAVENOUS | Status: AC
  Administered 2016-12-13: 1000 mg via INTRAVENOUS
  Filled 2016-12-13: qty 200

## 2016-12-13 MED ORDER — PIPERACILLIN-TAZOBACTAM 3.375 G IVPB 30 MIN
3.3750 g | Freq: Once | INTRAVENOUS | Status: AC
Start: 2016-12-13 — End: 2016-12-13
  Administered 2016-12-13: 3.375 g via INTRAVENOUS
  Filled 2016-12-13: qty 50

## 2016-12-13 MED ORDER — VANCOMYCIN HCL IN DEXTROSE 1-5 GM/200ML-% IV SOLN
1000.0000 mg | Freq: Two times a day (BID) | INTRAVENOUS | Status: DC
  Administered 2016-12-14 – 2016-12-16 (×5): 1000 mg via INTRAVENOUS
  Filled 2016-12-13 (×6): qty 200

## 2016-12-13 MED ORDER — IOPAMIDOL (ISOVUE-370) INJECTION 76%
INTRAVENOUS | Status: AC
  Administered 2016-12-13: 75 mL
  Filled 2016-12-13: qty 100

## 2016-12-13 MED ORDER — PIPERACILLIN-TAZOBACTAM 3.375 G IVPB
3.3750 g | Freq: Three times a day (TID) | INTRAVENOUS | Status: DC
  Administered 2016-12-14 – 2016-12-20 (×21): 3.375 g via INTRAVENOUS
  Filled 2016-12-13 (×22): qty 50

## 2016-12-13 MED ORDER — SODIUM CHLORIDE 0.9 % IV BOLUS (SEPSIS)
250.0000 mL | Freq: Once | INTRAVENOUS | Status: AC
  Administered 2016-12-13: 250 mL via INTRAVENOUS

## 2016-12-13 MED ORDER — ACETAMINOPHEN 325 MG PO TABS
650.0000 mg | ORAL_TABLET | Freq: Once | ORAL | Status: AC
  Administered 2016-12-13: 650 mg via ORAL
  Filled 2016-12-13: qty 2

## 2016-12-13 NOTE — ED Notes (Signed)
On way to CT 

## 2016-12-13 NOTE — ED Triage Notes (Addendum)
Pt reports he went to sleep 1pm, woke up at 4:30p and is only able to see light out of both eyes and dizzy.  Pt report he snorts heroin every day and injects heroin every other day, but today he injected heroin @ 7am.

## 2016-12-13 NOTE — ED Notes (Signed)
Hospitalist notified of lactic of 2.4

## 2016-12-13 NOTE — ED Notes (Signed)
EDP made aware of patient's lactic of 5.02

## 2016-12-13 NOTE — ED Notes (Signed)
EDP advised of patient's BP of 80/53. Advised to start another bolus.

## 2016-12-13 NOTE — ED Notes (Signed)
Pt given coke to drink per EDP

## 2016-12-13 NOTE — ED Notes (Signed)
Dr Criss AlvineGoldston ordered to hold off on MRI at this time.

## 2016-12-13 NOTE — ED Notes (Signed)
Pt given urinal and explained that we needed urine for culture and analysis.

## 2016-12-13 NOTE — ED Notes (Signed)
Dr. Katrinka BlazingSmith, hospitalist, notified of patient's low BP of 80/53. Advised to continue with order of another 1000ml bolus and he said that he will be by to see patient in approx. 10 minutes.

## 2016-12-13 NOTE — ED Notes (Signed)
Spoke with Dr Eudelia Bunchardama who does not feel this is a stroke.

## 2016-12-13 NOTE — ED Notes (Signed)
Unsuccessful attempt at IV access. Patient states that the IV team normally has to get his IV. Patient admits to shooting up heroin and having difficult vein access.

## 2016-12-13 NOTE — ED Notes (Signed)
Pt still unable to urinate 

## 2016-12-13 NOTE — ED Notes (Signed)
Pt in CT.

## 2016-12-13 NOTE — ED Notes (Signed)
Unable to assess visual acuity at this time.

## 2016-12-13 NOTE — ED Notes (Signed)
EDP made aware of patients low BP.  

## 2016-12-13 NOTE — ED Notes (Signed)
Pt unable to urinate at this time. He knows that a urinalysis is needed.

## 2016-12-13 NOTE — ED Notes (Signed)
Patient's O2 dropping to 89%. Place on 2L.

## 2016-12-13 NOTE — ED Provider Notes (Signed)
[facility]-EMERGENCY DEPT Provider Note   CSN: [id number] Arrival date & time: 12/13/16  1704     History   Chief Complaint Chief Complaint  Patient presents with  . Eye Problem  . Dizziness    HPI Jesus Watkins is a 45 y.o. male.  HPI  45 year old male presents with sudden onset vision loss. He states that around 1 PM he went to take a nap. He states that he thinks he had a little bit of a headache at that time. When he woke up around 5 PM he could not see at all besides maybe differentiating between light and dark. This was bilateral and complete. Never had this before. Had an associated severe bitemporal headache. Also some occipital pain. Denies specific neck pain or stiffness. I saw the patient after he had had his CT scan and he states that the vision is basically back to normal at this time and the headache is better. He still feels a little nauseated. He also endorses that he's been having cough and congestion for about one week. Has been having chills, most recently this afternoon. He also abuses heroin, most recently use that this morning around 7 AM. Does not typically get migraines.  Past Medical History:  Diagnosis Date  . Acid reflux   . Essential hypertension   . Hyperglycemia 10/2016    Patient Active Problem List   Diagnosis Date Noted  . Septic shock (HCC) 12/13/2016  . Abscess of antecubital fossa 11/05/2016  . Hyperglycemia 11/05/2016  . IVDA (intravenous drug abuse) complicating pregnancy 11/05/2016  . Leukocytosis 11/05/2016  . Essential hypertension 11/05/2016  . Homeless 11/05/2016  . Cellulitis     Past Surgical History:  Procedure Laterality Date  . bilateral arm abscesses    . DENTAL SURGERY    . I&D EXTREMITY Bilateral 11/05/2016   Procedure: IRRIGATION AND DEBRIDEMENT EXTREMITY;  Surgeon: Dominica Severin, MD;  Location: [facility] OR;  Service: Orthopedics;  Laterality: Bilateral;  . I&D EXTREMITY Bilateral 11/06/2016   Procedure: IRRIGATION AND  DEBRIDEMENT AND REPAIR OF ASSOCIATED STRUCTURES RIGHT AND LEFT ARMS;  Surgeon: Dominica Severin, MD;  Location: [facility] OR;  Service: Orthopedics;  Laterality: Bilateral;       Home Medications    Prior to Admission medications   Medication Sig Start Date End Date Taking? Authorizing Provider  folic acid (FOLVITE) 1 MG tablet Take 1 tablet (1 mg total) by mouth daily. Patient not taking: Reported on 12/13/2016 11/12/16   Narda Bonds, MD  Multiple Vitamin (MULTIVITAMIN WITH MINERALS) TABS tablet Take 1 tablet by mouth daily. Patient not taking: Reported on 12/13/2016 11/12/16   Narda Bonds, MD  oxyCODONE (OXY IR/ROXICODONE) 5 MG immediate release tablet Take 1 tablet (5 mg total) by mouth every 6 (six) hours as needed for moderate pain or severe pain. Patient not taking: Reported on 12/13/2016 11/11/16   Narda Bonds, MD  thiamine 100 MG tablet Take 1 tablet (100 mg total) by mouth daily. Patient not taking: Reported on 12/13/2016 11/12/16   Narda Bonds, MD    Family History Family History  Problem Relation Age of Onset  . Family history unknown: Yes    Social History Social History  Substance Use Topics  . Smoking status: Current Every Day Smoker    Packs/day: 0.50    Types: Cigarettes  . Smokeless tobacco: Never Used  . Alcohol use No     Allergies   Patient has no known allergies.   Review of Systems Review  of Systems  Constitutional: Positive for chills and fever.  HENT: Positive for congestion.        Dry mouth  Eyes: Positive for visual disturbance.  Respiratory: Positive for cough.   Gastrointestinal: Positive for nausea. Negative for vomiting.  Musculoskeletal: Negative for neck pain and neck stiffness.  Neurological: Positive for weakness (diffuse/generalized) and headaches.  All other systems reviewed and are negative.    Physical Exam Updated Vital Signs BP (!) 84/56   Pulse 93   Temp 100.2 F (37.9 C) (Rectal)   Resp 24   Ht 5\' 8"  (1.727 m)    Wt 155 lb (70.3 kg)   SpO2 94%   BMI 23.57 kg/m   Physical Exam  Constitutional: He is oriented to person, place, and time. He appears well-developed and well-nourished. No distress.  HENT:  Head: Normocephalic and atraumatic.  Right Ear: External ear normal.  Left Ear: External ear normal.  Nose: Nose normal.  Mouth/Throat: Mucous membranes are dry.  Eyes: EOM are normal. Pupils are equal, round, and reactive to light. Right eye exhibits no discharge. Left eye exhibits no discharge.  Patient is able to see clearly on my exam and has normal visual field testing  Neck: Normal range of motion. Neck supple.  Cardiovascular: Normal rate, regular rhythm and normal heart sounds.   Pulmonary/Chest: Effort normal and breath sounds normal.  Abdominal: Soft. He exhibits no distension. There is no tenderness.  Musculoskeletal: He exhibits no edema.  Neurological: He is alert and oriented to person, place, and time.  CN 3-12 grossly intact. 5/5 strength in all 4 extremities. Grossly normal sensation. Normal finger to nose.   Skin: Skin is warm and dry. He is not diaphoretic.  Multiple scars and old wounds on upper extremities  Nursing note and vitals reviewed.    ED Treatments / Results  Labs (all labs ordered are listed, but only abnormal results are displayed) Labs Reviewed  PROTIME-INR - Abnormal; Notable for the following:       Result Value   Prothrombin Time 15.8 (*)    All other components within normal limits  CBC - Abnormal; Notable for the following:    WBC 14.7 (*)    RBC 4.07 (*)    Hemoglobin 11.1 (*)    HCT 34.9 (*)    All other components within normal limits  DIFFERENTIAL - Abnormal; Notable for the following:    Neutro Abs 14.3 (*)    Lymphs Abs 0.3 (*)    All other components within normal limits  COMPREHENSIVE METABOLIC PANEL - Abnormal; Notable for the following:    Chloride 99 (*)    Glucose, Bld 158 (*)    BUN 21 (*)    Creatinine, Ser 1.74 (*)     Albumin 3.2 (*)    AST 120 (*)    Alkaline Phosphatase 235 (*)    GFR calc non Af Amer 46 (*)    GFR calc Af Amer 53 (*)    All other components within normal limits  URINALYSIS, ROUTINE W REFLEX MICROSCOPIC - Abnormal; Notable for the following:    APPearance HAZY (*)    Specific Gravity, Urine 1.034 (*)    Hgb urine dipstick SMALL (*)    Protein, ur 100 (*)    Bacteria, UA RARE (*)    Squamous Epithelial / LPF 0-5 (*)    All other components within normal limits  LACTIC ACID, PLASMA - Abnormal; Notable for the following:    Lactic Acid, Venous  2.4 (*)    All other components within normal limits  I-STAT CHEM 8, ED - Abnormal; Notable for the following:    Chloride 99 (*)    BUN 22 (*)    Creatinine, Ser 1.40 (*)    Glucose, Bld 161 (*)    Calcium, Ion 1.07 (*)    Hemoglobin 11.2 (*)    HCT 33.0 (*)    All other components within normal limits  I-STAT CG4 LACTIC ACID, ED - Abnormal; Notable for the following:    Lactic Acid, Venous 5.02 (*)    All other components within normal limits  CULTURE, BLOOD (ROUTINE X 2)  CULTURE, BLOOD (ROUTINE X 2)  URINE CULTURE  APTT  I-STAT TROPOININ, ED  CBG MONITORING, ED    EKG  EKG Interpretation  Date/Time:  Saturday December 13 2016 17:14:40 EST Ventricular Rate:  136 PR Interval:  116 QRS Duration: 90 QT Interval:  300 QTC Calculation: 451 R Axis:   59 Text Interpretation:  Sinus tachycardia Right atrial enlargement Minimal voltage criteria for LVH, may be normal variant Possible Anterior infarct , age undetermined Abnormal ECG tachycardia new since Nov 2017 Confirmed by Criss Alvine MD, Zev [phone]) on 12/13/2016 5:33:36 PM       Radiology Ct Angio Head W Or Wo Contrast  Result Date: 12/13/2016 CLINICAL DATA:  45 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 Dizziness. Injected herion this morning. Subsequent encounter. EXAM: CT ANGIOGRAPHY HEAD AND NECK  TECHNIQUE: Multidetector CT imaging of the head and neck was performed using the standard protocol during bolus administration of intravenous contrast. Multiplanar CT image reconstructions and MIPs were obtained to evaluate the vascular anatomy. Carotid stenosis measurements (when applicable) are obtained utilizing NASCET criteria, using the distal internal carotid diameter as the denominator. CONTRAST:  75 cc Isovue 370. COMPARISON:  12/13/2016 head CT. FINDINGS: CT HEAD FINDINGS Brain: No intracranial hemorrhage or CT evidence of large acute infarct. No intracranial mass or enhancement.  No hydrocephalus. Vascular: As below Skull: No acute abnormality. Sinuses: Mucosal thickening left sphenoid sinus, maxillary sinuses bilaterally and opacification ethmoid sinus air cells bilaterally. Orbits: No acute abnormality. Review of the MIP images confirms the above findings CTA NECK FINDINGS Aortic arch: 3 vessel arch with mild plaque. Right carotid system: Slightly irregular appearance of the right carotid artery throughout its course with mild narrowing which may represent changes of inflammation from drug abuse or atherosclerotic changes. No evidence of dissection or high-grade stenosis. Left carotid system: Slightly irregular appearance of the left carotid artery throughout its course with mild narrowing which may represent changes of inflammation from drug abuse or atherosclerotic change. No evidence of dissection or high-grade stenosis. Vertebral arteries: Left vertebral artery is dominant. No high-grade stenosis of either vertebral artery. Skeleton: Prominent dental disease. Cervical spondylotic changes most notable C6-7. Other neck: Prominent palatine tonsils may indicate inflammation without discrete mass identified. Direct visualization would be necessary to exclude subtle mucosal abnormality. Scattered increased number of small lymph nodes possibly reactive in origin. Upper chest: No lung apical lesion. Review  of the MIP images confirms the above findings CTA HEAD FINDINGS Anterior circulation: Mild irregularity of medium and large size vessels of the anterior circulation without high-grade stenosis. Mild to moderate branch vessel irregularity. Posterior circulation: Moderate narrowing distal right vertebral artery. Mild irregularity distal left vertebral artery. Mild to moderate narrowing portions of the basilar artery. Posterior cerebral artery mild to moderate branch vessel  irregularity and narrowing. Venous sinuses: Patent. Anatomic variants: Fetal contribution to the right posterior cerebral artery. Delayed phase: As above. Review of the MIP images confirms the above findings IMPRESSION: CT HEAD No intracranial hemorrhage or CT evidence of large acute infarct. No intracranial mass or enhancement. Mucosal thickening left sphenoid sinus, maxillary sinuses bilaterally and opacification ethmoid sinus air cells bilaterally. CTA NECK Slightly irregular appearance of the carotid artery throughout its course bilaterally with mild narrowing which may represent changes of inflammation from drug abuse or atherosclerotic changes. No evidence of dissection or high-grade stenosis. Left vertebral artery is dominant. No high-grade stenosis of either vertebral artery. Prominent dental disease. Prominent palatine tonsils may indicate inflammation without discrete mass. Direct visualization would be necessary to exclude mucosa abnormality. Scattered increased number of small lymph nodes possibly reactive in origin. CTA HEAD Mild irregularity of medium and large size vessels of the anterior circulation without high-grade stenosis. Mild to mild branch vessel irregularity. Moderate narrowing distal right vertebral artery. Mild irregularity distal left vertebral artery. Mild to moderate narrowing portions of the basilar artery. Posterior cerebral artery mild to moderate branch vessel irregularity and narrowing. Findings may indicate result  of vasculopathy related to patient's drug use or secondary to underlying atherosclerotic changes. Electronically Signed   By: Lacy Duverney M.D.   On: 12/13/2016 22:16   Ct Head Wo Contrast  Result Date: 12/13/2016 CLINICAL DATA:  Pt woke up about 2 hrs ago and was blind had a headache He is able to see images now EXAM: CT HEAD WITHOUT CONTRAST TECHNIQUE: Contiguous axial images were obtained from the base of the skull through the vertex without intravenous contrast. COMPARISON:  None. FINDINGS: Brain: No mass lesion, hemorrhage, hydrocephalus, acute infarct, intra-axial, or extra-axial fluid collection. Vascular: No hyperdense vessel or unexpected calcification. Skull: Normal Sinuses/Orbits: Normal orbits and globes. Mucosal thickening of ethmoid air cells and left sphenoid sinus. Clear mastoid air cells. Other:  None IMPRESSION: 1.  No acute intracranial abnormality. 2. Sinus disease. Electronically Signed   By: Jeronimo Greaves M.D.   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 Dizziness. Injected herion this morning. Subsequent encounter. EXAM: CT ANGIOGRAPHY HEAD AND NECK TECHNIQUE: Multidetector CT imaging of the head and neck was performed using the standard protocol during bolus administration of intravenous contrast. Multiplanar CT image reconstructions and MIPs were obtained to evaluate the vascular anatomy. Carotid stenosis measurements (when applicable) are obtained utilizing NASCET criteria, using the distal internal carotid diameter as the denominator. CONTRAST:  75 cc Isovue 370. COMPARISON:  12/13/2016 head CT. FINDINGS: CT HEAD FINDINGS Brain: No intracranial hemorrhage or CT evidence of large acute infarct. No intracranial mass or enhancement.  No hydrocephalus. Vascular: As below Skull: No acute abnormality. Sinuses: Mucosal  thickening left sphenoid sinus, maxillary sinuses bilaterally and opacification ethmoid sinus air cells bilaterally. Orbits: No acute abnormality. Review of the MIP images confirms the above findings CTA NECK FINDINGS Aortic arch: 3 vessel arch with mild plaque. Right carotid system: Slightly irregular appearance of the right carotid artery throughout its course with mild narrowing which may represent changes of inflammation from drug abuse or atherosclerotic changes. No evidence of dissection or high-grade stenosis. Left carotid system: Slightly irregular appearance of the left carotid artery throughout its course with mild narrowing which may represent changes of inflammation from  drug abuse or atherosclerotic change. No evidence of dissection or high-grade stenosis. Vertebral arteries: Left vertebral artery is dominant. No high-grade stenosis of either vertebral artery. Skeleton: Prominent dental disease. Cervical spondylotic changes most notable C6-7. Other neck: Prominent palatine tonsils may indicate inflammation without discrete mass identified. Direct visualization would be necessary to exclude subtle mucosal abnormality. Scattered increased number of small lymph nodes possibly reactive in origin. Upper chest: No lung apical lesion. Review of the MIP images confirms the above findings CTA HEAD FINDINGS Anterior circulation: Mild irregularity of medium and large size vessels of the anterior circulation without high-grade stenosis. Mild to moderate branch vessel irregularity. Posterior circulation: Moderate narrowing distal right vertebral artery. Mild irregularity distal left vertebral artery. Mild to moderate narrowing portions of the basilar artery. Posterior cerebral artery mild to moderate branch vessel irregularity and narrowing. Venous sinuses: Patent. Anatomic variants: Fetal contribution to the right posterior cerebral artery. Delayed phase: As above. Review of the MIP images confirms the above  findings IMPRESSION: CT HEAD No intracranial hemorrhage or CT evidence of large acute infarct. No intracranial mass or enhancement. Mucosal thickening left sphenoid sinus, maxillary sinuses bilaterally and opacification ethmoid sinus air cells bilaterally. CTA NECK Slightly irregular appearance of the carotid artery throughout its course bilaterally with mild narrowing which may represent changes of inflammation from drug abuse or atherosclerotic changes. No evidence of dissection or high-grade stenosis. Left vertebral artery is dominant. No high-grade stenosis of either vertebral artery. Prominent dental disease. Prominent palatine tonsils may indicate inflammation without discrete mass. Direct visualization would be necessary to exclude mucosa abnormality. Scattered increased number of small lymph nodes possibly reactive in origin. CTA HEAD Mild irregularity of medium and large size vessels of the anterior circulation without high-grade stenosis. Mild to mild branch vessel irregularity. Moderate narrowing distal right vertebral artery. Mild irregularity distal left vertebral artery. Mild to moderate narrowing portions of the basilar artery. Posterior cerebral artery mild to moderate branch vessel irregularity and narrowing. Findings may indicate result of vasculopathy related to patient's drug use or secondary to underlying atherosclerotic changes. Electronically Signed   By: Lacy Duverney M.D.   On: 12/13/2016 22:16   Dg Chest Portable 1 View  Result Date: 12/13/2016 CLINICAL DATA:  Shortness of breath and productive cough for 2 weeks, worsening. EXAM: PORTABLE CHEST 1 VIEW COMPARISON:  PA and lateral chest 11/04/2016. FINDINGS: There is new right lower lobe airspace disease. Left lung is clear. Heart size is normal. No pneumothorax or pleural effusion. Remote left rib fracture is noted. IMPRESSION: Right lower lobe airspace disease most consistent with pneumonia. Recommend followup to clearing.  Electronically Signed   By: Drusilla Kanner M.D.   On: 12/13/2016 20:10    Procedures Procedures (including critical care time)  Angiocath insertion Performed by: Pricilla Loveless T  Consent: Verbal consent obtained. Risks and benefits: risks, benefits and alternatives were discussed Time out: Immediately prior to procedure a "time out" was called to verify the correct patient, procedure, equipment, support staff and site/side marked as required.  Preparation: Patient was prepped and draped in the usual sterile fashion.  Vein Location: right basilic  Ultrasound Guided  Gauge: 20  Normal blood return and flush without difficulty Patient tolerance: Patient tolerated the procedure well with no immediate complications.  CRITICAL CARE Performed by: Pricilla Loveless T   Total critical care time: 40 minutes  Critical care time was exclusive of separately billable procedures and treating other patients.  Critical care was necessary to treat or prevent imminent  or life-threatening deterioration.  Critical care was time spent personally by me on the following activities: development of treatment plan with patient and/or surrogate as well as nursing, discussions with consultants, evaluation of patient's response to treatment, examination of patient, obtaining history from patient or surrogate, ordering and performing treatments and interventions, ordering and review of laboratory studies, ordering and review of radiographic studies, pulse oximetry and re-evaluation of patient's condition.   Medications Ordered in ED Medications  piperacillin-tazobactam (ZOSYN) IVPB 3.375 g (not administered)  vancomycin (VANCOCIN) IVPB 1000 mg/200 mL premix (not administered)  sodium chloride 0.9 % bolus 1,000 mL (0 mLs Intravenous Stopped 12/13/16 2006)  sodium chloride 0.9 % bolus 1,000 mL (0 mLs Intravenous Stopped 12/13/16 2104)  acetaminophen (TYLENOL) tablet 650 mg (650 mg Oral Given 12/13/16  2003)  iopamidol (ISOVUE-370) 76 % injection (75 mLs  Contrast Given 12/13/16 2000)  sodium chloride 0.9 % bolus 250 mL (0 mLs Intravenous Stopped 12/13/16 2148)  piperacillin-tazobactam (ZOSYN) IVPB 3.375 g (0 g Intravenous Stopped 12/13/16 2136)  vancomycin (VANCOCIN) IVPB 1000 mg/200 mL premix (0 mg Intravenous Stopped 12/13/16 2229)  0.9 %  sodium chloride infusion ( Intravenous New Bag/Given 12/13/16 2115)  sodium chloride 0.9 % bolus 1,000 mL (0 mLs Intravenous Stopped 12/13/16 2256)  sodium chloride 0.9 % bolus 1,000 mL (1,000 mLs Intravenous New Bag/Given 12/13/16 2356)     Initial Impression / Assessment and Plan / ED Course  I have reviewed the triage vital signs and the nursing notes.  Pertinent labs & imaging results that were available during my care of the patient were reviewed by me and considered in my medical decision making (see chart for details).  Clinical Course as of Dec 14 2  Sat Dec 13, 2016  1825 Patient's symptoms have nearly resolved. The headache is much better and he states that his vision is basically back to normal. Patient will be given IV fluids because of his concern for dehydration. He will have a rectal temperature assessed as well given a oral temp of 99.2. Headache started about 5 hours ago with no obvious subarachnoid hemorrhage. Think an LP is needed at this time. There were no specific neck symptoms. Consult neurology.  [SG]  [id number] Discussed case with neurology, Dr. Amada JupiterKirkpatrick. Visitation is odd but given fever and other symptoms, Dr. Amada JupiterKirkpatrick recommends CT a of head and neck as well as MRI with and without contrast of head. Will also do chest x-ray because of his cough congestion. Neurology will come and consult on patient for further recommendations.  [SG]  2007 He will call code sepsis as his lactic acid has returned at 5.0. He has had borderline low blood pressures in the 90s but is mentating well. Continue fluids, broad antibiotics given no  obvious source.  [SG]  2106 Dr Katrinka BlazingSmith to admit to stepdown.  [SG]  2303 D/w ICU, Dr. Sherene SiresWert, they will come to admit. Currently his BP is in the 80s systolic but 67 MAP. Has not urinated yet, will put in foley for closer intake/output monitoring  [SG]  2344 ICU fellow asks for 4th liter, will admit  [SG]    Clinical Course User Index [SG] Pricilla LovelessScott Jolin Benavides, MD    Patient will continue to get IV fluids. Foley catheter was placed and he only had 200 mL of urine output despite already having 3 L of fluids. I believe the fever is from the pneumonia. His neurologic symptoms have completely resolved. He has no meningismus. He is not altered. He is  critically ill however and will need admission to the ICU for sepsis management.  Final Clinical Impressions(s) / ED Diagnoses   Final diagnoses:  Septic shock (HCC)  HCAP (healthcare-associated pneumonia)    New Prescriptions New Prescriptions   No medications on file     Pricilla LovelessScott Zelpha Messing, MD 12/14/16 0004

## 2016-12-13 NOTE — Progress Notes (Signed)
Pharmacy Antibiotic Note  Jesus Watkins is a 45 y.o. male admitted on 12/13/2016 with sepsis.  Pharmacy has been consulted for vancomycin and Zosyn dosing.  Soft BP, febrile up to 101.8, WBC elevated along with lactic acid.  Plan: Vancomycin 1000mg  IV x1 (as ordered by EDP), then 1000mg  IV every 12 hours.  Goal trough 15-20 mcg/mL. Zosyn 3.375g IV x1 over 30 minutes (as ordered by EDP), then 3.375g IV q8h (4 hour infusion). Follow renal function, c/s, clinical progression, trough at SS  Height: 5\' 8"  (172.7 cm) Weight: 155 lb (70.3 kg) IBW/kg (Calculated) : 68.4  Temp (24hrs), Avg:100.9 F (38.3 C), Min:99.2 F (37.3 C), Max:101.8 F (38.8 C)   Recent Labs Lab 12/13/16 1716 12/13/16 1735 12/13/16 1950  WBC 14.7*  --   --   CREATININE 1.74* 1.40*  --   LATICACIDVEN  --   --  5.02*    Estimated Creatinine Clearance: 65.1 mL/min (by C-G formula based on SCr of 1.4 mg/dL (H)).    No Known Allergies  Antimicrobials this admission: Vanc 12/23 >>  Zosyn 12/23 >>   Dose adjustments this admission: n/a  Microbiology results: 12/23 BCx: sent 12/23 UCx: ordered   Thank you for allowing pharmacy to be a part of this patient's care.  Zarina Pe D. Philipe Laswell, PharmD, BCPS Clinical Pharmacist Pager: [phone] 12/13/2016 8:16 PM

## 2016-12-14 ENCOUNTER — Inpatient Hospital Stay (HOSPITAL_COMMUNITY): Payer: Self-pay

## 2016-12-14 DIAGNOSIS — R6520 Severe sepsis without septic shock: Secondary | ICD-10-CM

## 2016-12-14 DIAGNOSIS — A419 Sepsis, unspecified organism: Principal | ICD-10-CM

## 2016-12-14 LAB — BASIC METABOLIC PANEL
ANION GAP: 11 (ref 5–15)
BUN: 23 mg/dL — ABNORMAL HIGH (ref 6–20)
CHLORIDE: 106 mmol/L (ref 101–111)
CO2: 20 mmol/L — AB (ref 22–32)
Calcium: 7.4 mg/dL — ABNORMAL LOW (ref 8.9–10.3)
Creatinine, Ser: 1.66 mg/dL — ABNORMAL HIGH (ref 0.61–1.24)
GFR calc Af Amer: 56 mL/min — ABNORMAL LOW (ref >60)
GFR calc non Af Amer: 49 mL/min — ABNORMAL LOW (ref >60)
Glucose, Bld: 145 mg/dL — ABNORMAL HIGH (ref 65–99)
POTASSIUM: 4.1 mmol/L (ref 3.5–5.1)
Sodium: 137 mmol/L (ref 135–145)

## 2016-12-14 LAB — BLOOD GAS, VENOUS
ACID-BASE DEFICIT: 2.6 mmol/L — AB (ref 0.0–2.0)
BICARBONATE: 22.2 mmol/L (ref 20.0–28.0)
O2 Saturation: 63 %
PCO2 VEN: 41.9 mmHg — AB (ref 44.0–60.0)
PH VEN: 7.344 (ref 7.250–7.430)
Patient temperature: 98.6
pO2, Ven: 36.4 mmHg (ref 32.0–45.0)

## 2016-12-14 LAB — CBC
HEMATOCRIT: 30.8 % — AB (ref 39.0–52.0)
HEMOGLOBIN: 9.8 g/dL — AB (ref 13.0–17.0)
MCH: 26.7 pg (ref 26.0–34.0)
MCHC: 31.8 g/dL (ref 30.0–36.0)
MCV: 83.9 fL (ref 78.0–100.0)
Platelets: 170 10*3/uL (ref 150–400)
RBC: 3.67 MIL/uL — AB (ref 4.22–5.81)
RDW: 14.5 % (ref 11.5–15.5)
WBC: 28.4 10*3/uL — AB (ref 4.0–10.5)

## 2016-12-14 LAB — MAGNESIUM: Magnesium: 1.4 mg/dL — ABNORMAL LOW (ref 1.7–2.4)

## 2016-12-14 LAB — PHOSPHORUS: PHOSPHORUS: 4 mg/dL (ref 2.5–4.6)

## 2016-12-14 LAB — LACTIC ACID, PLASMA
Lactic Acid, Venous: 2.8 mmol/L (ref 0.5–1.9)
Lactic Acid, Venous: 2.5 mmol/L (ref 0.5–1.9)

## 2016-12-14 LAB — MRSA PCR SCREENING: MRSA by PCR: NEGATIVE

## 2016-12-14 MED ORDER — ENOXAPARIN SODIUM 40 MG/0.4ML ~~LOC~~ SOLN
40.0000 mg | SUBCUTANEOUS | Status: DC
  Administered 2016-12-14 – 2016-12-23 (×10): 40 mg via SUBCUTANEOUS
  Filled 2016-12-14 (×10): qty 0.4

## 2016-12-14 MED ORDER — ORAL CARE MOUTH RINSE
15.0000 mL | Freq: Two times a day (BID) | OROMUCOSAL | Status: DC
  Administered 2016-12-14 – 2016-12-23 (×14): 15 mL via OROMUCOSAL

## 2016-12-14 MED ORDER — METHADONE HCL 10 MG PO TABS
10.0000 mg | ORAL_TABLET | Freq: Every day | ORAL | Status: DC
  Administered 2016-12-14: 10 mg via ORAL
  Filled 2016-12-14: qty 1

## 2016-12-14 MED ORDER — GADOBENATE DIMEGLUMINE 529 MG/ML IV SOLN
14.0000 mL | Freq: Once | INTRAVENOUS | Status: AC | PRN
  Administered 2016-12-14: 14 mL via INTRAVENOUS

## 2016-12-14 MED ORDER — METHADONE HCL 10 MG/ML PO CONC: 10.0000 mg | Freq: Every day | ORAL | Status: DC

## 2016-12-14 MED ORDER — LORAZEPAM 1 MG PO TABS
1.0000 mg | ORAL_TABLET | Freq: Three times a day (TID) | ORAL | Status: DC | PRN
Start: 2016-12-14 — End: 2016-12-23
  Administered 2016-12-14 – 2016-12-22 (×8): 1 mg via ORAL
  Filled 2016-12-14 (×10): qty 1

## 2016-12-14 NOTE — ED Notes (Signed)
ICU MD in room with pt.

## 2016-12-14 NOTE — Progress Notes (Addendum)
PULMONARY / CRITICAL CARE MEDICINE   Name: Jesus KinScott Mcelrath MRN: [id number] DOB: 03/12/1971    ADMISSION DATE:  12/13/2016 CONSULTATION DATE:  12/14/16  REFERRING MD:  Pricilla LovelessScott Goldston  CHIEF COMPLAINT:  Vision loss, Severe sepsis  HISTORY OF PRESENT ILLNESS:   Jesus Watkins is a 45 y.o. male with heroin abuse history who presented to the [facility] with vision loss.  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 After discharge, he has been living in a hotel nearby in [place] and continued to use heroin. Earlier on 12/23, he took a hit of heroin and started having "[place] fevers" and nausea. He went to bed and woke up with vision loss bilaterally - he was only able to make out dark and light brightness but no visual acuity. His friend drove him to the [facility] as his symptoms gradually resolved completely. In the ED, he had a CT head which was negative for acute pathology followed up by a CTA Head and Neck showing vessel aberrancy consistent with IV drug use. He was noted to have a leukocytosis, lactic acidosis and oliguira in the setting of SBPs in the 80s-90s. He was given 4 liters of normal saline and had an improvement in his lactic acidosis from 5 to 2.4. At the time of interview, he was diaphoretic and complaining of being cold. His vision was subjectively back to normal.   SUBJ  - C/o headache fels like 'withdrawal'  VITAL SIGNS: BP (!) 125/91   Pulse 89   Temp 98.6 F (37 C) (Oral)   Resp 20   Ht 5\' 8"  (1.727 m)   Wt 158 lb 11.7 oz (72 kg)   SpO2 97%   BMI 24.14 kg/m   PHYSICAL EXAMINATION: General:  Alert and oriented, no acute distress, diaphoretic Neuro:  Normal strength grossly in all extremities HEENT:  MMM, no neck LAD, anicteric sclera Cardiovascular:  RRR, no murmurs rubs or gallops Lungs:  CTAB, no increased work of breathing Abdomen:  Soft, NT, ND Musculoskeletal:  Scars noted  on bilateral antecubital fossa from surgery, sutures + Skin:  No splinter hemorrhages, Janeway lesions, or other rashes, multiple IV track marks noted  LABS:  BMET  Recent Labs Lab 12/13/16 1716 12/13/16 1735 12/14/16 0528  NA 135 135 137  K 4.1 4.1 4.1  CL 99* 99* 106  CO2 22  --  20*  BUN 21* 22* 23*  CREATININE 1.74* 1.40* 1.66*  GLUCOSE 158* 161* 145*    Electrolytes  Recent Labs Lab 12/13/16 1716 12/14/16 0528  CALCIUM 8.9 7.4*  MG  --  1.4*  PHOS  --  4.0    CBC  Recent Labs Lab 12/13/16 1716 12/13/16 1735 12/14/16 0528  WBC 14.7*  --  28.4*  HGB 11.1* 11.2* 9.8*  HCT 34.9* 33.0* 30.8*  PLT 228  --  170    Coag's  Recent Labs Lab 12/13/16 1716  APTT 30  INR 1.25    Sepsis Markers  Recent Labs Lab 12/13/16 2115 12/14/16 0528 12/14/16 0826  LATICACIDVEN 2.4* 2.8* 2.5*    ABG No results for input(s): PHART, PCO2ART, PO2ART in the last 168 hours.  Liver Enzymes  Recent Labs Lab 12/13/16 1716  AST 120*  ALT 39  ALKPHOS 235*  BILITOT 0.8  ALBUMIN 3.2*    Cardiac Enzymes No results for input(s): TROPONINI, PROBNP in the last 168 hours.  Glucose No  results for input(s): GLUCAP in the last 168 hours.  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 Dizziness. Injected herion this morning. Subsequent encounter. EXAM: CT ANGIOGRAPHY HEAD AND NECK TECHNIQUE: Multidetector CT imaging of the head and neck was performed using the standard protocol during bolus administration of intravenous contrast. Multiplanar CT image reconstructions and MIPs were obtained to evaluate the vascular anatomy. Carotid stenosis measurements (when applicable) are obtained utilizing NASCET criteria, using the distal internal carotid diameter as the denominator. CONTRAST:  75 cc Isovue 370. COMPARISON:  12/13/2016  head CT. FINDINGS: CT HEAD FINDINGS Brain: No intracranial hemorrhage or CT evidence of large acute infarct. No intracranial mass or enhancement.  No hydrocephalus. Vascular: As below Skull: No acute abnormality. Sinuses: Mucosal thickening left sphenoid sinus, maxillary sinuses bilaterally and opacification ethmoid sinus air cells bilaterally. Orbits: No acute abnormality. Review of the MIP images confirms the above findings CTA NECK FINDINGS Aortic arch: 3 vessel arch with mild plaque. Right carotid system: Slightly irregular appearance of the right carotid artery throughout its course with mild narrowing which may represent changes of inflammation from drug abuse or atherosclerotic changes. No evidence of dissection or high-grade stenosis. Left carotid system: Slightly irregular appearance of the left carotid artery throughout its course with mild narrowing which may represent changes of inflammation from drug abuse or atherosclerotic change. No evidence of dissection or high-grade stenosis. Vertebral arteries: Left vertebral artery is dominant. No high-grade stenosis of either vertebral artery. Skeleton: Prominent dental disease. Cervical spondylotic changes most notable C6-7. Other neck: Prominent palatine tonsils may indicate inflammation without discrete mass identified. Direct visualization would be necessary to exclude subtle mucosal abnormality. Scattered increased number of small lymph nodes possibly reactive in origin. Upper chest: No lung apical lesion. Review of the MIP images confirms the above findings CTA HEAD FINDINGS Anterior circulation: Mild irregularity of medium and large size vessels of the anterior circulation without high-grade stenosis. Mild to moderate branch vessel irregularity. Posterior circulation: Moderate narrowing distal right vertebral artery. Mild irregularity distal left vertebral artery. Mild to moderate narrowing portions of the basilar artery. Posterior cerebral artery mild  to moderate branch vessel irregularity and narrowing. Venous sinuses: Patent. Anatomic variants: Fetal contribution to the right posterior cerebral artery. Delayed phase: As above. Review of the MIP images confirms the above findings IMPRESSION: CT HEAD No intracranial hemorrhage or CT evidence of large acute infarct. No intracranial mass or enhancement. Mucosal thickening left sphenoid sinus, maxillary sinuses bilaterally and opacification ethmoid sinus air cells bilaterally. CTA NECK Slightly irregular appearance of the carotid artery throughout its course bilaterally with mild narrowing which may represent changes of inflammation from drug abuse or atherosclerotic changes. No evidence of dissection or high-grade stenosis. Left vertebral artery is dominant. No high-grade stenosis of either vertebral artery. Prominent dental disease. Prominent palatine tonsils may indicate inflammation without discrete mass. Direct visualization would be necessary to exclude mucosa abnormality. Scattered increased number of small lymph nodes possibly reactive in origin. CTA HEAD Mild irregularity of medium and large size vessels of the anterior circulation without high-grade stenosis. Mild to mild branch vessel irregularity. Moderate narrowing distal right vertebral artery. Mild irregularity distal left vertebral artery. Mild to moderate narrowing portions of the basilar artery. Posterior cerebral artery mild to moderate branch vessel irregularity and narrowing. Findings may indicate result of vasculopathy related to patient's  drug use or secondary to underlying atherosclerotic changes. Electronically Signed   By: Lacy Duverney M.D.   On: 12/13/2016 22:16   Ct Head Wo Contrast  Result Date: 12/13/2016 CLINICAL DATA:  Pt woke up about 2 hrs ago and was blind had a headache He is able to see images now EXAM: CT HEAD WITHOUT CONTRAST TECHNIQUE: Contiguous axial images were obtained from the base of the skull through the vertex  without intravenous contrast. COMPARISON:  None. FINDINGS: Brain: No mass lesion, hemorrhage, hydrocephalus, acute infarct, intra-axial, or extra-axial fluid collection. Vascular: No hyperdense vessel or unexpected calcification. Skull: Normal Sinuses/Orbits: Normal orbits and globes. Mucosal thickening of ethmoid air cells and left sphenoid sinus. Clear mastoid air cells. Other:  None IMPRESSION: 1.  No acute intracranial abnormality. 2. Sinus disease. Electronically Signed   By: Jeronimo Greaves M.D.   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 Dizziness. Injected herion this morning. Subsequent encounter. EXAM: CT ANGIOGRAPHY HEAD AND NECK TECHNIQUE: Multidetector CT imaging of the head and neck was performed using the standard protocol during bolus administration of intravenous contrast. Multiplanar CT image reconstructions and MIPs were obtained to evaluate the vascular anatomy. Carotid stenosis measurements (when applicable) are obtained utilizing NASCET criteria, using the distal internal carotid diameter as the denominator. CONTRAST:  75 cc Isovue 370. COMPARISON:  12/13/2016 head CT. FINDINGS: CT HEAD FINDINGS Brain: No intracranial hemorrhage or CT evidence of large acute infarct. No intracranial mass or enhancement.  No hydrocephalus. Vascular: As below Skull: No acute abnormality. Sinuses: Mucosal thickening left sphenoid sinus, maxillary sinuses bilaterally and opacification ethmoid sinus air cells bilaterally. Orbits: No acute abnormality. Review of the MIP images confirms the above findings CTA NECK FINDINGS Aortic arch: 3 vessel arch with mild plaque. Right carotid system: Slightly irregular appearance of the right carotid artery throughout its course with mild narrowing which may represent changes of inflammation from drug abuse  or atherosclerotic changes. No evidence of dissection or high-grade stenosis. Left carotid system: Slightly irregular appearance of the left carotid artery throughout its course with mild narrowing which may represent changes of inflammation from drug abuse or atherosclerotic change. No evidence of dissection or high-grade stenosis. Vertebral arteries: Left vertebral artery is dominant. No high-grade stenosis of either vertebral artery. Skeleton: Prominent dental disease. Cervical spondylotic changes most notable C6-7. Other neck: Prominent palatine tonsils may indicate inflammation without discrete mass identified. Direct visualization would be necessary to exclude subtle mucosal abnormality. Scattered increased number of small lymph nodes possibly reactive in origin. Upper chest: No lung apical lesion. Review of the MIP images confirms the above findings CTA HEAD FINDINGS Anterior circulation: Mild irregularity of medium and large size vessels of the anterior circulation without high-grade stenosis. Mild to moderate branch vessel irregularity. Posterior circulation: Moderate narrowing distal right vertebral artery. Mild irregularity distal left vertebral artery. Mild to moderate narrowing portions of the basilar artery. Posterior cerebral artery mild to moderate branch vessel irregularity and narrowing. Venous sinuses: Patent. Anatomic variants: Fetal contribution to the right posterior cerebral artery. Delayed phase: As above. Review of the MIP images confirms the above findings IMPRESSION: CT HEAD No intracranial hemorrhage or CT evidence of large acute infarct. No intracranial mass or enhancement. Mucosal thickening left sphenoid sinus, maxillary sinuses bilaterally and opacification ethmoid sinus air cells bilaterally. CTA NECK  Slightly irregular appearance of the carotid artery throughout its course bilaterally with mild narrowing which may represent changes of inflammation from drug abuse or atherosclerotic  changes. No evidence of dissection or high-grade stenosis. Left vertebral artery is dominant. No high-grade stenosis of either vertebral artery. Prominent dental disease. Prominent palatine tonsils may indicate inflammation without discrete mass. Direct visualization would be necessary to exclude mucosa abnormality. Scattered increased number of small lymph nodes possibly reactive in origin. CTA HEAD Mild irregularity of medium and large size vessels of the anterior circulation without high-grade stenosis. Mild to mild branch vessel irregularity. Moderate narrowing distal right vertebral artery. Mild irregularity distal left vertebral artery. Mild to moderate narrowing portions of the basilar artery. Posterior cerebral artery mild to moderate branch vessel irregularity and narrowing. Findings may indicate result of vasculopathy related to patient's drug use or secondary to underlying atherosclerotic changes. Electronically Signed   By: Lacy DuverneySteven  Olson M.D.   On: 12/13/2016 22:16   Mr Laqueta JeanBrain W And Wo Contrast  Result Date: 12/14/2016 CLINICAL DATA:  Transient vision loss. Headache. Fever. Hairline abuse. EXAM: MRI HEAD WITHOUT AND WITH CONTRAST TECHNIQUE: Multiplanar, multiecho pulse sequences of the brain and surrounding structures were obtained without and with intravenous contrast. CONTRAST:  14mL MULTIHANCE GADOBENATE DIMEGLUMINE 529 MG/ML IV SOLN COMPARISON:  Head CT/ CTA 12/13/2016 FINDINGS: The study is mildly motion degraded. Brain: There is a 2 mm punctate focus of restricted diffusion involving cortex or subcortical white matter in the posterior left temporal lobe (series 3, image 23). Similar punctate foci of restricted diffusion are questioned in the right temporal lobe (series 3, image 23) and right parietal lobe (series 9, image 7). There is no evidence of intracranial hemorrhage, mass, midline shift, or extra-axial fluid collection. The ventricles and sulci are normal. No significant chronic ischemic  changes are identified within limitations of motion artifact. No abnormal enhancement is identified. Vascular: Major intracranial vascular flow voids are preserved. Skull and upper cervical spine: Unremarkable bone marrow signal. Sinuses/Orbits: Unremarkable orbits. Mild mucosal thickening throughout the paranasal sinuses. Small right mastoid effusion. Other: None. IMPRESSION: 1. 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 2. Questionable additional tiny embolic infarcts versus artifact in the right temporal and right parietal lobes. Electronically Signed   By: Sebastian AcheAllen  Grady M.D.   On: 12/14/2016 08:18   Dg Chest Portable 1 View  Result Date: 12/13/2016 CLINICAL DATA:  Shortness of breath and productive cough for 2 weeks, worsening. EXAM: PORTABLE CHEST 1 VIEW COMPARISON:  PA and lateral chest 11/04/2016. FINDINGS: There is new right lower lobe airspace disease. Left lung is clear. Heart size is normal. No pneumothorax or pleural effusion. Remote left rib fracture is noted. IMPRESSION: Right lower lobe airspace disease most consistent with pneumonia. Recommend followup to clearing. Electronically Signed   By: Drusilla Kannerhomas  Dalessio M.D.   On: 12/13/2016 20:10    DISCUSSION: Jesus Watkins is a 45 y.o. male with continuing heroin abuse who presents with altered vision (resolved) and severe sepsis with lactic acidosis and oliguria of presumptively from pneumonia.  ASSESSMENT / PLAN:  PULMONARY A: Right lower lobe infiltrate, slight hypoxia - Concerning for developing RLL pneumonia. Would be hospital associated pneumonia due to recent admission. He is also at risk of aspiration pneumonia.  P:   Continue oxygen therapy and antibiotics  CARDIOVASCULAR A:  Hypotension, severe sepsis-resolved Lactate -improved P:  NS @ 75/h Echocardiogram to evaluate for endocarditis  RENAL A:   AKI from sepsis and dehydration P:  IVFs Replete lytes as needed  GASTROINTESTINAL A:   Nausea,  vomiting P:   Likely related to heroin use. Will monitor  HEMATOLOGIC A:   Leukocytosis from sepsis P:  Will monitor DVT prophylaxis ordered  INFECTIOUS A:   Sepsis - concern for septic shock but lactic acid and tachycardia are better with fluid resusitation. Sources can include bacteremia due to drug abuse, endocarditis, RLL pneumonia P:   Vanc + Zosyn FU Blood cultures   ENDOCRINE A:   Hyperglycemia P:   Will monitor  NEUROLOGIC A:   Acute vision loss- resolved. Septic emboli or vasospasm remain concerns with IV drug use. CTA Head and Neck suggest vasculopathy.  MRI/MRA Brain  12/24 - temporal infarcts Heroin withdrawal P:   Methadone 10 mg - can taper over next few days Ativan 1 q8 prn Neuro input if symptoms recur   Can transfer to tele & to [facility] 12/25 am   Cyril Mourning MD. [facility].  Pulmonary & Critical care Pager [phone] If no response call [phone]     12/14/2016, 3:14 PM

## 2016-12-14 NOTE — H&P (Addendum)
PULMONARY / CRITICAL CARE MEDICINE   Name: Jesus Watkins MRN: [id number] DOB: [birth year]    ADMISSION DATE:  12/13/2016 CONSULTATION DATE:  12/14/16  REFERRING MD:  Pricilla Loveless  CHIEF COMPLAINT:  Vision loss, Severe sepsis  HISTORY OF PRESENT ILLNESS:   Jesus Watkins is a 45 y.o. male with heroin abuse history who presented to the [facility] ED with vision loss.  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 After discharge, he has been living in a hotel nearby in [place] and continued to use heroin. Earlier on 12/23, he took a hit of heroin and started having "[place] fevers" and nausea. He went to bed and woke up with vision loss bilaterally - he was only able to make out dark and light brightness but no visual acuity. His friend drove him to the [facility] ED as his symptoms gradually resolved completely. In the ED, he had a CT head which was negative for acute pathology followed up by a CTA Head and Neck showing vessel aberrancy consistent with IV drug use. He was noted to have a leukocytosis, lactic acidosis and oliguira in the setting of SBPs in the 80s-90s. He was given 4 liters of normal saline and had an improvement in his lactic acidosis from 5 to 2.4. At the time of interview, he was diaphoretic and complaining of being cold. His vision was subjectively back to normal.   PAST MEDICAL HISTORY :  He  has a past medical history of Acid reflux; Essential hypertension; and Hyperglycemia (10/2016).  PAST SURGICAL HISTORY: He  has a past surgical history that includes bilateral arm abscesses; Dental surgery; I&D extremity (Bilateral, 11/05/2016); and I&D extremity (Bilateral, 11/06/2016).  No Known Allergies  No current facility-administered medications on file prior to encounter.    Current Outpatient Prescriptions on File Prior to Encounter  Medication Sig  . folic acid (FOLVITE) 1 MG tablet Take 1  tablet (1 mg total) by mouth daily. (Patient not taking: Reported on 12/13/2016)  . Multiple Vitamin (MULTIVITAMIN WITH MINERALS) TABS tablet Take 1 tablet by mouth daily. (Patient not taking: Reported on 12/13/2016)  . oxyCODONE (OXY IR/ROXICODONE) 5 MG immediate release tablet Take 1 tablet (5 mg total) by mouth every 6 (six) hours as needed for moderate pain or severe pain. (Patient not taking: Reported on 12/13/2016)  . thiamine 100 MG tablet Take 1 tablet (100 mg total) by mouth daily. (Patient not taking: Reported on 12/13/2016)    FAMILY HISTORY:  His has no family status information on file.    SOCIAL HISTORY: He  reports that he has been smoking Cigarettes.  He has been smoking about 0.50 packs per day. He has never used smokeless tobacco. He reports that he uses drugs, including IV and Heroin. He reports that he does not drink alcohol.  REVIEW OF SYSTEMS:   + chills, nausea - night sweats, fevers, cough, GI bleeding, vomiting, diarrhea, neuro symptoms Rest of complete ROS negative   VITAL SIGNS: BP (!) 87/61   Pulse 87   Temp 100.2 F (37.9 C) (Rectal)   Resp 24   Ht 5\' 8"  (1.727 m)   Wt 70.3 kg (155 lb)   SpO2 96%   BMI 23.57 kg/m   PHYSICAL EXAMINATION: General:  Alert and oriented, no acute distress, diaphoretic Neuro:  Normal strength grossly in all extremities HEENT:  MMM, no neck LAD, anicteric sclera Cardiovascular:  RRR, no murmurs rubs  or gallops Lungs:  CTAB, no increased work of breathing Abdomen:  Soft, NT, ND Musculoskeletal:  Scars noted on bilateral antecubital fossa from surgery Skin:  No splinter hemorrhages, Janeway lesions, or other rashes, multiple IV track marks noted  LABS:  BMET  Recent Labs Lab 12/13/16 1716 12/13/16 1735  NA 135 135  K 4.1 4.1  CL 99* 99*  CO2 22  --   BUN 21* 22*  CREATININE 1.74* 1.40*  GLUCOSE 158* 161*    Electrolytes  Recent Labs Lab 12/13/16 1716  CALCIUM 8.9    CBC  Recent Labs Lab  12/13/16 1716 12/13/16 1735  WBC 14.7*  --   HGB 11.1* 11.2*  HCT 34.9* 33.0*  PLT 228  --     Coag's  Recent Labs Lab 12/13/16 1716  APTT 30  INR 1.25    Sepsis Markers  Recent Labs Lab 12/13/16 1950 12/13/16 2115  LATICACIDVEN 5.02* 2.4*    ABG No results for input(s): PHART, PCO2ART, PO2ART in the last 168 hours.  Liver Enzymes  Recent Labs Lab 12/13/16 1716  AST 120*  ALT 39  ALKPHOS 235*  BILITOT 0.8  ALBUMIN 3.2*    Cardiac Enzymes No results for input(s): TROPONINI, PROBNP in the last 168 hours.  Glucose No results for input(s): GLUCAP in the last 168 hours.  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 Dizziness. Injected herion this morning. Subsequent encounter. EXAM: CT ANGIOGRAPHY HEAD AND NECK TECHNIQUE: Multidetector CT imaging of the head and neck was performed using the standard protocol during bolus administration of intravenous contrast. Multiplanar CT image reconstructions and MIPs were obtained to evaluate the vascular anatomy. Carotid stenosis measurements (when applicable) are obtained utilizing NASCET criteria, using the distal internal carotid diameter as the denominator. CONTRAST:  75 cc Isovue 370. COMPARISON:  12/13/2016 head CT. FINDINGS: CT HEAD FINDINGS Brain: No intracranial hemorrhage or CT evidence of large acute infarct. No intracranial mass or enhancement.  No hydrocephalus. Vascular: As below Skull: No acute abnormality. Sinuses: Mucosal thickening left sphenoid sinus, maxillary sinuses bilaterally and opacification ethmoid sinus air cells bilaterally. Orbits: No acute abnormality. Review of the MIP images confirms the above findings CTA NECK FINDINGS Aortic arch: 3 vessel arch with mild plaque. Right carotid system: Slightly irregular appearance of the right carotid artery  throughout its course with mild narrowing which may represent changes of inflammation from drug abuse or atherosclerotic changes. No evidence of dissection or high-grade stenosis. Left carotid system: Slightly irregular appearance of the left carotid artery throughout its course with mild narrowing which may represent changes of inflammation from drug abuse or atherosclerotic change. No evidence of dissection or high-grade stenosis. Vertebral arteries: Left vertebral artery is dominant. No high-grade stenosis of either vertebral artery. Skeleton: Prominent dental disease. Cervical spondylotic changes most notable C6-7. Other neck: Prominent palatine tonsils may indicate inflammation without discrete mass identified. Direct visualization would be necessary to exclude subtle mucosal abnormality. Scattered increased number of small lymph nodes possibly reactive in origin. Upper chest: No lung apical lesion. Review of the MIP images confirms the above findings CTA HEAD FINDINGS Anterior circulation: Mild irregularity of medium and large size vessels of the anterior circulation without high-grade stenosis. Mild to moderate branch vessel irregularity. Posterior circulation: Moderate narrowing distal right vertebral artery. Mild irregularity distal left vertebral artery. Mild to moderate  narrowing portions of the basilar artery. Posterior cerebral artery mild to moderate branch vessel irregularity and narrowing. Venous sinuses: Patent. Anatomic variants: Fetal contribution to the right posterior cerebral artery. Delayed phase: As above. Review of the MIP images confirms the above findings IMPRESSION: CT HEAD No intracranial hemorrhage or CT evidence of large acute infarct. No intracranial mass or enhancement. Mucosal thickening left sphenoid sinus, maxillary sinuses bilaterally and opacification ethmoid sinus air cells bilaterally. CTA NECK Slightly irregular appearance of the carotid artery throughout its course  bilaterally with mild narrowing which may represent changes of inflammation from drug abuse or atherosclerotic changes. No evidence of dissection or high-grade stenosis. Left vertebral artery is dominant. No high-grade stenosis of either vertebral artery. Prominent dental disease. Prominent palatine tonsils may indicate inflammation without discrete mass. Direct visualization would be necessary to exclude mucosa abnormality. Scattered increased number of small lymph nodes possibly reactive in origin. CTA HEAD Mild irregularity of medium and large size vessels of the anterior circulation without high-grade stenosis. Mild to mild branch vessel irregularity. Moderate narrowing distal right vertebral artery. Mild irregularity distal left vertebral artery. Mild to moderate narrowing portions of the basilar artery. Posterior cerebral artery mild to moderate branch vessel irregularity and narrowing. Findings may indicate result of vasculopathy related to patient's drug use or secondary to underlying atherosclerotic changes. Electronically Signed   By: Lacy Duverney M.D.   On: 12/13/2016 22:16   Ct Head Wo Contrast  Result Date: 12/13/2016 CLINICAL DATA:  Pt woke up about 2 hrs ago and was blind had a headache He is able to see images now EXAM: CT HEAD WITHOUT CONTRAST TECHNIQUE: Contiguous axial images were obtained from the base of the skull through the vertex without intravenous contrast. COMPARISON:  None. FINDINGS: Brain: No mass lesion, hemorrhage, hydrocephalus, acute infarct, intra-axial, or extra-axial fluid collection. Vascular: No hyperdense vessel or unexpected calcification. Skull: Normal Sinuses/Orbits: Normal orbits and globes. Mucosal thickening of ethmoid air cells and left sphenoid sinus. Clear mastoid air cells. Other:  None IMPRESSION: 1.  No acute intracranial abnormality. 2. Sinus disease. Electronically Signed   By: Jeronimo Greaves M.D.   On: 12/13/2016 18:26   Ct Angio Neck W And/or Wo  Contrast  Result Date: 12/13/2016 CLINICAL DATA:  44 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 Dizziness. Injected herion this morning. Subsequent encounter. EXAM: CT ANGIOGRAPHY HEAD AND NECK TECHNIQUE: Multidetector CT imaging of the head and neck was performed using the standard protocol during bolus administration of intravenous contrast. Multiplanar CT image reconstructions and MIPs were obtained to evaluate the vascular anatomy. Carotid stenosis measurements (when applicable) are obtained utilizing NASCET criteria, using the distal internal carotid diameter as the denominator. CONTRAST:  75 cc Isovue 370. COMPARISON:  12/13/2016 head CT. FINDINGS: CT HEAD FINDINGS Brain: No intracranial hemorrhage or CT evidence of large acute infarct. No intracranial mass or enhancement.  No hydrocephalus. Vascular: As below Skull: No acute abnormality. Sinuses: Mucosal thickening left sphenoid sinus, maxillary sinuses bilaterally and opacification ethmoid sinus air cells bilaterally. Orbits: No acute abnormality. Review of the MIP images confirms the above findings CTA NECK FINDINGS Aortic arch: 3 vessel arch with mild plaque. Right carotid system: Slightly irregular appearance of the right carotid artery throughout its course with mild narrowing which may represent changes of inflammation from drug abuse or atherosclerotic changes. No evidence of dissection or high-grade stenosis. Left carotid system: Slightly irregular appearance of the left carotid  artery throughout its course with mild narrowing which may represent changes of inflammation from drug abuse or atherosclerotic change. No evidence of dissection or high-grade stenosis. Vertebral arteries: Left vertebral artery is dominant. No high-grade stenosis of either vertebral artery. Skeleton: Prominent dental disease. Cervical spondylotic changes most notable C6-7. Other neck: Prominent palatine  tonsils may indicate inflammation without discrete mass identified. Direct visualization would be necessary to exclude subtle mucosal abnormality. Scattered increased number of small lymph nodes possibly reactive in origin. Upper chest: No lung apical lesion. Review of the MIP images confirms the above findings CTA HEAD FINDINGS Anterior circulation: Mild irregularity of medium and large size vessels of the anterior circulation without high-grade stenosis. Mild to moderate branch vessel irregularity. Posterior circulation: Moderate narrowing distal right vertebral artery. Mild irregularity distal left vertebral artery. Mild to moderate narrowing portions of the basilar artery. Posterior cerebral artery mild to moderate branch vessel irregularity and narrowing. Venous sinuses: Patent. Anatomic variants: Fetal contribution to the right posterior cerebral artery. Delayed phase: As above. Review of the MIP images confirms the above findings IMPRESSION: CT HEAD No intracranial hemorrhage or CT evidence of large acute infarct. No intracranial mass or enhancement. Mucosal thickening left sphenoid sinus, maxillary sinuses bilaterally and opacification ethmoid sinus air cells bilaterally. CTA NECK Slightly irregular appearance of the carotid artery throughout its course bilaterally with mild narrowing which may represent changes of inflammation from drug abuse or atherosclerotic changes. No evidence of dissection or high-grade stenosis. Left vertebral artery is dominant. No high-grade stenosis of either vertebral artery. Prominent dental disease. Prominent palatine tonsils may indicate inflammation without discrete mass. Direct visualization would be necessary to exclude mucosa abnormality. Scattered increased number of small lymph nodes possibly reactive in origin. CTA HEAD Mild irregularity of medium and large size vessels of the anterior circulation without high-grade stenosis. Mild to mild branch vessel irregularity.  Moderate narrowing distal right vertebral artery. Mild irregularity distal left vertebral artery. Mild to moderate narrowing portions of the basilar artery. Posterior cerebral artery mild to moderate branch vessel irregularity and narrowing. Findings may indicate result of vasculopathy related to patient's drug use or secondary to underlying atherosclerotic changes. Electronically Signed   By: Lacy Duverney M.D.   On: 12/13/2016 22:16   Dg Chest Portable 1 View  Result Date: 12/13/2016 CLINICAL DATA:  Shortness of breath and productive cough for 2 weeks, worsening. EXAM: PORTABLE CHEST 1 VIEW COMPARISON:  PA and lateral chest 11/04/2016. FINDINGS: There is new right lower lobe airspace disease. Left lung is clear. Heart size is normal. No pneumothorax or pleural effusion. Remote left rib fracture is noted. IMPRESSION: Right lower lobe airspace disease most consistent with pneumonia. Recommend followup to clearing. Electronically Signed   By: Drusilla Kanner M.D.   On: 12/13/2016 20:10    DISCUSSION: Jesus Watkins is a 45 y.o. male with continuing heroin abuse who presents with altered vision (resolved) and severe sepsis with lactic acidosis and oliguria of presumptively from pneumonia.  ASSESSMENT / PLAN:  PULMONARY A: Right lower lobe infiltrate, slight hypoxia - Concerning for developing RLL pneumonia. Would be hospital associated pneumonia due to recent admission. He is also at risk of aspiration pneumonia.  P:   Continue oxygen therapy and antibiotics  CARDIOVASCULAR A:  Hypotension, severe sepsis P:  Continued fluid resuscitation Echocardiogram to evaluate for endocarditis  RENAL A:   AKI from sepsis and dehydration P:   Fluid resusitation as lactate is improving  GASTROINTESTINAL A:   Nausea, vomiting P:  Likely related to heroin use. Will monitor  HEMATOLOGIC A:   Leukocytosis from sepsis P:  Will monitor DVT prophylaxis ordered  INFECTIOUS A:   Sepsis - concern  for septic shock but lactic acid and tachycardia are better with fluid resusitation. Sources can include bacteremia due to drug abuse, endocarditis, RLL pneumonia P:   Vanc + Zosyn Blood cultures sent Echocardiogram ordered  ENDOCRINE A:   Hyperglycemia P:   Will monitor  NEUROLOGIC A:   Acute vision loss- resolved. Septic emboli or vasospasm remain concerns with IV drug use. CTA Head and Neck suggest vasculopathy.  P:   Neuro consulted and following. Plan to do MRI/MRA Brain  Will need ophtho consult    FAMILY  - Updates: No family present  - Inter-disciplinary family meet or Palliative Care meeting due by:  12/21/16  30 mins critical care time spent on this encounter  Cornell BarmanArun Mohsin Crum MD Pulmonary and Critical Care Medicine [facility] Pager: [phone]  12/14/2016, 12:57 AM

## 2016-12-14 NOTE — Progress Notes (Addendum)
Patient complaining of withdrawal like symptoms including nausea, tremors, chills, anxiety, and irritability. Patient calm & coorperative, but restless in bed. BP 91/65, HR NSR in 80s will update MD and continue to monitor.   Pt now resting and asleep. Will continue to monitor.

## 2016-12-15 ENCOUNTER — Inpatient Hospital Stay (HOSPITAL_COMMUNITY): Payer: Self-pay

## 2016-12-15 DIAGNOSIS — I638 Other cerebral infarction: Secondary | ICD-10-CM

## 2016-12-15 DIAGNOSIS — I6389 Other cerebral infarction: Secondary | ICD-10-CM

## 2016-12-15 DIAGNOSIS — F17210 Nicotine dependence, cigarettes, uncomplicated: Secondary | ICD-10-CM

## 2016-12-15 DIAGNOSIS — N179 Acute kidney failure, unspecified: Secondary | ICD-10-CM

## 2016-12-15 DIAGNOSIS — J189 Pneumonia, unspecified organism: Secondary | ICD-10-CM

## 2016-12-15 DIAGNOSIS — G459 Transient cerebral ischemic attack, unspecified: Secondary | ICD-10-CM

## 2016-12-15 DIAGNOSIS — F1123 Opioid dependence with withdrawal: Secondary | ICD-10-CM

## 2016-12-15 DIAGNOSIS — F1910 Other psychoactive substance abuse, uncomplicated: Secondary | ICD-10-CM

## 2016-12-15 DIAGNOSIS — F1110 Opioid abuse, uncomplicated: Secondary | ICD-10-CM

## 2016-12-15 DIAGNOSIS — A419 Sepsis, unspecified organism: Secondary | ICD-10-CM

## 2016-12-15 DIAGNOSIS — F1193 Opioid use, unspecified with withdrawal: Secondary | ICD-10-CM

## 2016-12-15 LAB — RAPID HIV SCREEN (HIV 1/2 AB+AG)
HIV 1/2 ANTIBODIES: NONREACTIVE
HIV-1 P24 Antigen - HIV24: NONREACTIVE

## 2016-12-15 LAB — URINE CULTURE: Culture: NO GROWTH

## 2016-12-15 LAB — CBC
HCT: 28.1 % — ABNORMAL LOW (ref 39.0–52.0)
Hemoglobin: 9 g/dL — ABNORMAL LOW (ref 13.0–17.0)
MCH: 27.1 pg (ref 26.0–34.0)
MCHC: 32 g/dL (ref 30.0–36.0)
MCV: 84.6 fL (ref 78.0–100.0)
PLATELETS: 185 10*3/uL (ref 150–400)
RBC: 3.32 MIL/uL — AB (ref 4.22–5.81)
RDW: 14.8 % (ref 11.5–15.5)
WBC: 27.4 10*3/uL — AB (ref 4.0–10.5)

## 2016-12-15 LAB — TROPONIN I: Troponin I: <0.03 ng/mL (ref <0.03)

## 2016-12-15 LAB — BASIC METABOLIC PANEL
ANION GAP: 9 (ref 5–15)
BUN: 19 mg/dL (ref 6–20)
CO2: 24 mmol/L (ref 22–32)
Calcium: 8.1 mg/dL — ABNORMAL LOW (ref 8.9–10.3)
Chloride: 106 mmol/L (ref 101–111)
Creatinine, Ser: 1.21 mg/dL (ref 0.61–1.24)
GFR calc Af Amer: >60 mL/min (ref >60)
Glucose, Bld: 88 mg/dL (ref 65–99)
POTASSIUM: 3.5 mmol/L (ref 3.5–5.1)
SODIUM: 139 mmol/L (ref 135–145)

## 2016-12-15 LAB — LACTIC ACID, PLASMA
LACTIC ACID, VENOUS: 0.7 mmol/L (ref 0.5–1.9)
Lactic Acid, Venous: 1.1 mmol/L (ref 0.5–1.9)

## 2016-12-15 LAB — ECHOCARDIOGRAM COMPLETE
Height: 68 in
WEIGHTICAEL: 2447.99 [oz_av]

## 2016-12-15 MED ORDER — ONDANSETRON HCL 4 MG/2ML IJ SOLN
4.0000 mg | Freq: Four times a day (QID) | INTRAMUSCULAR | Status: DC | PRN
  Administered 2016-12-15: 4 mg via INTRAVENOUS
  Filled 2016-12-15 (×2): qty 2

## 2016-12-15 MED ORDER — METHADONE HCL 5 MG PO TABS
5.0000 mg | ORAL_TABLET | Freq: Three times a day (TID) | ORAL | Status: DC
  Administered 2016-12-15 – 2016-12-17 (×7): 5 mg via ORAL
  Filled 2016-12-15 (×7): qty 1

## 2016-12-15 MED ORDER — POTASSIUM CHLORIDE CRYS ER 20 MEQ PO TBCR
20.0000 meq | EXTENDED_RELEASE_TABLET | ORAL | Status: AC
  Administered 2016-12-15 (×2): 20 meq via ORAL
  Filled 2016-12-15 (×2): qty 1

## 2016-12-15 MED ORDER — CLONIDINE HCL 0.2 MG/24HR TD PTWK
0.2000 mg | MEDICATED_PATCH | TRANSDERMAL | Status: DC
  Administered 2016-12-15: 0.2 mg via TRANSDERMAL
  Filled 2016-12-15: qty 1

## 2016-12-15 MED ORDER — ONDANSETRON HCL 4 MG/2ML IJ SOLN: 4.0000 mg | Freq: Four times a day (QID) | INTRAMUSCULAR | Status: DC

## 2016-12-15 NOTE — Consult Note (Signed)
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  IVDA ARF  Would  Check TEE Would await his BCx No change in anbx for now.  Check HIV and acute hep panel.   Comment- Would query if his MSSA in his wound on 11-15 seeded his valves  Thank you so much for this interesting consult,   Bobby Rumpf (pager) [phone] [email or web address]  Jesus Watkins is an 45 y.o. male.  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 He was brought to ED after he woke up with bilateral vision loss. He had head CT that was negative, his sx resolved. He was noted to have leukocytosis and lactic acidosis. He was also felt to have RLL infiltrate.  He was started on vanco/zosyn.   Is not aware of "NA" Has prev been tested for hepatitis and HIV, states tests were (-)  Past Medical History:  Diagnosis Date  . Acid reflux   . Essential hypertension   . Hyperglycemia 10/2016    Past Surgical History:  Procedure Laterality Date  . bilateral arm abscesses    . DENTAL SURGERY    . I&D EXTREMITY Bilateral 11/05/2016   Procedure: IRRIGATION AND DEBRIDEMENT EXTREMITY;  Surgeon: Roseanne Kaufman, MD;  Location: [facility];  Service: Orthopedics;  Laterality: Bilateral;  . I&D EXTREMITY Bilateral 11/06/2016   Procedure: IRRIGATION AND DEBRIDEMENT AND REPAIR OF ASSOCIATED STRUCTURES RIGHT AND LEFT ARMS;  Surgeon: Roseanne Kaufman, MD;  Location: [facility];  Service: Orthopedics;  Laterality: Bilateral;     No Known Allergies  Medications:  Scheduled: . enoxaparin (LOVENOX) injection  40 mg Subcutaneous Q24H  . mouth rinse  15 mL Mouth Rinse BID  . methadone  10 mg Oral Daily  . ondansetron (ZOFRAN) IV  4 mg Intravenous Q6H  . piperacillin-tazobactam (ZOSYN)  IV  3.375 g Intravenous Q8H  . vancomycin   1,000 mg Intravenous Q12H    Abtx:  Anti-infectives    Start     Dose/Rate Route Frequency Ordered Stop   12/14/16 0800  vancomycin (VANCOCIN) IVPB 1000 mg/200 mL premix     1,000 mg 200 mL/hr over 60 Minutes Intravenous Every 12 hours 12/13/16 2019     12/14/16 0200  piperacillin-tazobactam (ZOSYN) IVPB 3.375 g     3.375 g 12.5 mL/hr over 240 Minutes Intravenous Every 8 hours 12/13/16 2019     12/13/16 2015  piperacillin-tazobactam (ZOSYN) IVPB 3.375 g     3.375 g 100 mL/hr over 30 Minutes Intravenous  Once 12/13/16 2007 12/13/16 2136   12/13/16 2015  vancomycin (VANCOCIN) IVPB 1000 mg/200 mL premix     1,000 mg 200 mL/hr over 60 Minutes Intravenous  Once 12/13/16 2007 12/13/16 2229      Total days of antibiotics: 1 vanco/zosyn          Social History:  reports that he has been smoking Cigarettes.  He has been smoking about 0.50 packs per day. He has never used smokeless tobacco. He reports that he uses drugs, including IV and Heroin. He reports that he does not drink alcohol.  Family History  Problem Relation Age of Onset  . Family history unknown: Yes    General ROS: normal BM, normal urine, no sob, no cough, no LAN, denies f/c. see HPI.   Blood pressure Marland Kitchen)  134/96, pulse 80, temperature 98.3 F (36.8 C), temperature source Oral, resp. rate 17, height _0  (1.727 m), weight 69.4 kg (153 lb), SpO2 94 %. General appearance: alert, cooperative and no distress Eyes: negative findings: conjunctivae and sclerae normal and pupils equal, round, reactive to light and accomodation Throat: lips, mucosa, and tongue normal; teeth and gums normal Neck: no adenopathy and supple, symmetrical, trachea midline Lungs: clear to auscultation bilaterally Heart: regular rate and rhythm Abdomen: normal findings: bowel sounds normal and soft, non-tender Extremities: edema none and large scar on RUE   Results for orders placed or performed during the hospital encounter of 12/13/16 (from the  past 48 hour(s))  Protime-INR     Status: Abnormal   Collection Time: 12/13/16  5:16 PM  Result Value Ref Range   Prothrombin Time 15.8 (H) 11.4 - 15.2 seconds   INR 1.25   APTT     Status: None   Collection Time: 12/13/16  5:16 PM  Result Value Ref Range   aPTT 30 24 - 36 seconds  CBC     Status: Abnormal   Collection Time: 12/13/16  5:16 PM  Result Value Ref Range   WBC 14.7 (H) 4.0 - 10.5 K/uL   RBC 4.07 (L) 4.22 - 5.81 MIL/uL   Hemoglobin 11.1 (L) 13.0 - 17.0 g/dL   HCT 34.9 (L) 39.0 - 52.0 %   MCV 85.7 78.0 - 100.0 fL   MCH 27.3 26.0 - 34.0 pg   MCHC 31.8 30.0 - 36.0 g/dL   RDW 14.7 11.5 - 15.5 %   Platelets 228 150 - 400 K/uL  Differential     Status: Abnormal   Collection Time: 12/13/16  5:16 PM  Result Value Ref Range   Neutrophils Relative % 97 %   Lymphocytes Relative 2 %   Monocytes Relative 1 %   Eosinophils Relative 0 %   Basophils Relative 0 %   Neutro Abs 14.3 (H) 1.7 - 7.7 K/uL   Lymphs Abs 0.3 (L) 0.7 - 4.0 K/uL   Monocytes Absolute 0.1 0.1 - 1.0 K/uL   Eosinophils Absolute 0.0 0.0 - 0.7 K/uL   Basophils Absolute 0.0 0.0 - 0.1 K/uL   WBC Morphology INCREASED BANDS (>20% BANDS)   Comprehensive metabolic panel     Status: Abnormal   Collection Time: 12/13/16  5:16 PM  Result Value Ref Range   Sodium 135 135 - 145 mmol/L   Potassium 4.1 3.5 - 5.1 mmol/L   Chloride 99 (L) 101 - 111 mmol/L   CO2 22 22 - 32 mmol/L   Glucose, Bld 158 (H) 65 - 99 mg/dL   BUN 21 (H) 6 - 20 mg/dL   Creatinine, Ser 1.74 (H) 0.61 - 1.24 mg/dL   Calcium 8.9 8.9 - 10.3 mg/dL   Total Protein 6.6 6.5 - 8.1 g/dL   Albumin 3.2 (L) 3.5 - 5.0 g/dL   AST 120 (H) 15 - 41 U/L   ALT 39 17 - 63 U/L   Alkaline Phosphatase 235 (H) 38 - 126 U/L   Total Bilirubin 0.8 0.3 - 1.2 mg/dL   GFR calc non Af Amer 46 (L) >60 mL/min   GFR calc Af Amer 53 (L) >60 mL/min    Comment: (NOTE) The eGFR has been calculated using the CKD EPI equation. This calculation has not been validated in all clinical  situations. eGFR's persistently <60 mL/min signify possible Chronic Kidney Disease.    Anion gap 14 5 - 15  I-stat troponin,  ED     Status: None   Collection Time: 12/13/16  5:33 PM  Result Value Ref Range   Troponin i, poc 0.01 0.00 - 0.08 ng/mL   Comment 3            Comment: Due to the release kinetics of cTnI, a negative result within the first hours of the onset of symptoms does not rule out myocardial infarction with certainty. If myocardial infarction is still suspected, repeat the test at appropriate intervals.   I-Stat Chem 8, ED     Status: Abnormal   Collection Time: 12/13/16  5:35 PM  Result Value Ref Range   Sodium 135 135 - 145 mmol/L   Potassium 4.1 3.5 - 5.1 mmol/L   Chloride 99 (L) 101 - 111 mmol/L   BUN 22 (H) 6 - 20 mg/dL   Creatinine, Ser 1.40 (H) 0.61 - 1.24 mg/dL   Glucose, Bld 161 (H) 65 - 99 mg/dL   Calcium, Ion 1.07 (L) 1.15 - 1.40 mmol/L   TCO2 23 0 - 100 mmol/L   Hemoglobin 11.2 (L) 13.0 - 17.0 g/dL   HCT 33.0 (L) 39.0 - 52.0 %  Blood culture (routine x 2)     Status: None (Preliminary result)   Collection Time: 12/13/16  7:20 PM  Result Value Ref Range   Specimen Description BLOOD RIGHT UPPER ARM    Special Requests BOTTLES DRAWN AEROBIC AND ANAEROBIC 5CC EA    Culture NO GROWTH < 12 HOURS    Report Status PENDING   Blood culture (routine x 2)     Status: None (Preliminary result)   Collection Time: 12/13/16  7:30 PM  Result Value Ref Range   Specimen Description BLOOD LEFT HAND    Special Requests BOTTLES DRAWN AEROBIC AND ANAEROBIC 5CC EA    Culture NO GROWTH < 12 HOURS    Report Status PENDING   I-Stat CG4 Lactic Acid, ED     Status: Abnormal   Collection Time: 12/13/16  7:50 PM  Result Value Ref Range   Lactic Acid, Venous 5.02 (HH) 0.5 - 1.9 mmol/L   Comment NOTIFIED PHYSICIAN   Lactic acid, plasma     Status: Abnormal   Collection Time: 12/13/16  9:15 PM  Result Value Ref Range   Lactic Acid, Venous 2.4 (HH) 0.5 - 1.9 mmol/L     Comment: CRITICAL RESULT CALLED TO, READ BACK BY AND VERIFIED WITH: HOLCOMB J,RN 12/13/16 2207 WAYK   Urinalysis, Routine w reflex microscopic     Status: Abnormal   Collection Time: 12/13/16 11:10 PM  Result Value Ref Range   Color, Urine YELLOW YELLOW   APPearance HAZY (A) CLEAR   Specific Gravity, Urine 1.034 (H) 1.005 - 1.030   pH 5.0 5.0 - 8.0   Glucose, UA NEGATIVE NEGATIVE mg/dL   Hgb urine dipstick SMALL (A) NEGATIVE   Bilirubin Urine NEGATIVE NEGATIVE   Ketones, ur NEGATIVE NEGATIVE mg/dL   Protein, ur 100 (A) NEGATIVE mg/dL   Nitrite NEGATIVE NEGATIVE   Leukocytes, UA NEGATIVE NEGATIVE   RBC / HPF 6-30 0 - 5 RBC/hpf   WBC, UA 6-30 0 - 5 WBC/hpf   Bacteria, UA RARE (A) NONE SEEN   Squamous Epithelial / LPF 0-5 (A) NONE SEEN  MRSA PCR Screening     Status: None   Collection Time: 12/14/16 12:53 AM  Result Value Ref Range   MRSA by PCR NEGATIVE NEGATIVE    Comment:        The  GeneXpert MRSA Assay (FDA approved for NASAL specimens only), is one component of a comprehensive MRSA colonization surveillance program. It is not intended to diagnose MRSA infection nor to guide or monitor treatment for MRSA infections.   CBC     Status: Abnormal   Collection Time: 12/14/16  5:28 AM  Result Value Ref Range   WBC 28.4 (H) 4.0 - 10.5 K/uL   RBC 3.67 (L) 4.22 - 5.81 MIL/uL   Hemoglobin 9.8 (L) 13.0 - 17.0 g/dL   HCT 30.8 (L) 39.0 - 52.0 %   MCV 83.9 78.0 - 100.0 fL   MCH 26.7 26.0 - 34.0 pg   MCHC 31.8 30.0 - 36.0 g/dL   RDW 14.5 11.5 - 15.5 %   Platelets 170 150 - 400 K/uL  Basic metabolic panel     Status: Abnormal   Collection Time: 12/14/16  5:28 AM  Result Value Ref Range   Sodium 137 135 - 145 mmol/L   Potassium 4.1 3.5 - 5.1 mmol/L   Chloride 106 101 - 111 mmol/L   CO2 20 (L) 22 - 32 mmol/L   Glucose, Bld 145 (H) 65 - 99 mg/dL   BUN 23 (H) 6 - 20 mg/dL   Creatinine, Ser 1.66 (H) 0.61 - 1.24 mg/dL   Calcium 7.4 (L) 8.9 - 10.3 mg/dL   GFR calc non Af Amer 49  (L) >60 mL/min   GFR calc Af Amer 56 (L) >60 mL/min    Comment: (NOTE) The eGFR has been calculated using the CKD EPI equation. This calculation has not been validated in all clinical situations. eGFR's persistently <60 mL/min signify possible Chronic Kidney Disease.    Anion gap 11 5 - 15  Magnesium     Status: Abnormal   Collection Time: 12/14/16  5:28 AM  Result Value Ref Range   Magnesium 1.4 (L) 1.7 - 2.4 mg/dL  Phosphorus     Status: None   Collection Time: 12/14/16  5:28 AM  Result Value Ref Range   Phosphorus 4.0 2.5 - 4.6 mg/dL  Lactic acid, plasma     Status: Abnormal   Collection Time: 12/14/16  5:28 AM  Result Value Ref Range   Lactic Acid, Venous 2.8 (HH) 0.5 - 1.9 mmol/L    Comment: CRITICAL RESULT CALLED TO, READ BACK BY AND VERIFIED WITH: WARLICK M,RN 56/43/32 [id number] WAYK   Blood gas, venous     Status: Abnormal   Collection Time: 12/14/16  5:29 AM  Result Value Ref Range   pH, Ven 7.344 7.250 - 7.430   pCO2, Ven 41.9 (L) 44.0 - 60.0 mmHg   pO2, Ven 36.4 32.0 - 45.0 mmHg   Bicarbonate 22.2 20.0 - 28.0 mmol/L   Acid-base deficit 2.6 (H) 0.0 - 2.0 mmol/L   O2 Saturation 63.0 %   Patient temperature 98.6   Lactic acid, plasma     Status: Abnormal   Collection Time: 12/14/16  8:26 AM  Result Value Ref Range   Lactic Acid, Venous 2.5 (HH) 0.5 - 1.9 mmol/L    Comment: CRITICAL RESULT CALLED TO, READ BACK BY AND VERIFIED WITH: MLEKUSHJRN [id number] MCCAULEG   CBC     Status: Abnormal   Collection Time: 12/15/16  3:48 AM  Result Value Ref Range   WBC 27.4 (H) 4.0 - 10.5 K/uL   RBC 3.32 (L) 4.22 - 5.81 MIL/uL   Hemoglobin 9.0 (L) 13.0 - 17.0 g/dL   HCT 28.1 (L) 39.0 - 52.0 %   MCV 84.6  78.0 - 100.0 fL   MCH 27.1 26.0 - 34.0 pg   MCHC 32.0 30.0 - 36.0 g/dL   RDW 14.8 11.5 - 15.5 %   Platelets 185 150 - 400 K/uL  Basic metabolic panel     Status: Abnormal   Collection Time: 12/15/16  3:48 AM  Result Value Ref Range   Sodium 139 135 - 145 mmol/L   Potassium  3.5 3.5 - 5.1 mmol/L   Chloride 106 101 - 111 mmol/L   CO2 24 22 - 32 mmol/L   Glucose, Bld 88 65 - 99 mg/dL   BUN 19 6 - 20 mg/dL   Creatinine, Ser 1.21 0.61 - 1.24 mg/dL   Calcium 8.1 (L) 8.9 - 10.3 mg/dL   GFR calc non Af Amer >60 >60 mL/min   GFR calc Af Amer >60 >60 mL/min    Comment: (NOTE) The eGFR has been calculated using the CKD EPI equation. This calculation has not been validated in all clinical situations. eGFR's persistently <60 mL/min signify possible Chronic Kidney Disease.    Anion gap 9 5 - 15  Lactic acid, plasma     Status: None   Collection Time: 12/15/16  7:00 AM  Result Value Ref Range   Lactic Acid, Venous 0.7 0.5 - 1.9 mmol/L      Component Value Date/Time   SDES BLOOD LEFT HAND 12/13/2016 1930   SPECREQUEST BOTTLES DRAWN AEROBIC AND ANAEROBIC 5CC EA 12/13/2016 1930   CULT NO GROWTH < 12 HOURS 12/13/2016 1930   REPTSTATUS PENDING 12/13/2016 1930   Ct Angio Head W Or Wo Contrast  Result Date: 12/13/2016 CLINICAL DATA:  45 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 Dizziness. Injected herion this morning. Subsequent encounter. EXAM: CT ANGIOGRAPHY HEAD AND NECK TECHNIQUE: Multidetector CT imaging of the head and neck was performed using the standard protocol during bolus administration of intravenous contrast. Multiplanar CT image reconstructions and MIPs were obtained to evaluate the vascular anatomy. Carotid stenosis measurements (when applicable) are obtained utilizing NASCET criteria, using the distal internal carotid diameter as the denominator. CONTRAST:  75 cc Isovue 370. COMPARISON:  12/13/2016 head CT. FINDINGS: CT HEAD FINDINGS Brain: No intracranial hemorrhage or CT evidence of large acute infarct. No intracranial mass or enhancement.  No hydrocephalus. Vascular: As below Skull: No acute abnormality. Sinuses: Mucosal thickening left sphenoid sinus, maxillary sinuses bilaterally and  opacification ethmoid sinus air cells bilaterally. Orbits: No acute abnormality. Review of the MIP images confirms the above findings CTA NECK FINDINGS Aortic arch: 3 vessel arch with mild plaque. Right carotid system: Slightly irregular appearance of the right carotid artery throughout its course with mild narrowing which may represent changes of inflammation from drug abuse or atherosclerotic changes. No evidence of dissection or high-grade stenosis. Left carotid system: Slightly irregular appearance of the left carotid artery throughout its course with mild narrowing which may represent changes of inflammation from drug abuse or atherosclerotic change. No evidence of dissection or high-grade stenosis. Vertebral arteries: Left vertebral artery is dominant. No high-grade stenosis of either vertebral artery. Skeleton: Prominent dental disease. Cervical spondylotic changes most notable C6-7. Other neck: Prominent palatine tonsils may indicate inflammation without discrete mass identified. Direct visualization would be necessary to exclude subtle mucosal abnormality. Scattered increased number of small lymph nodes possibly reactive in origin. Upper chest: No lung apical lesion. Review of the MIP images confirms the above findings CTA HEAD FINDINGS Anterior circulation: Mild  irregularity of medium and large size vessels of the anterior circulation without high-grade stenosis. Mild to moderate branch vessel irregularity. Posterior circulation: Moderate narrowing distal right vertebral artery. Mild irregularity distal left vertebral artery. Mild to moderate narrowing portions of the basilar artery. Posterior cerebral artery mild to moderate branch vessel irregularity and narrowing. Venous sinuses: Patent. Anatomic variants: Fetal contribution to the right posterior cerebral artery. Delayed phase: As above. Review of the MIP images confirms the above findings IMPRESSION: CT HEAD No intracranial hemorrhage or CT evidence  of large acute infarct. No intracranial mass or enhancement. Mucosal thickening left sphenoid sinus, maxillary sinuses bilaterally and opacification ethmoid sinus air cells bilaterally. CTA NECK Slightly irregular appearance of the carotid artery throughout its course bilaterally with mild narrowing which may represent changes of inflammation from drug abuse or atherosclerotic changes. No evidence of dissection or high-grade stenosis. Left vertebral artery is dominant. No high-grade stenosis of either vertebral artery. Prominent dental disease. Prominent palatine tonsils may indicate inflammation without discrete mass. Direct visualization would be necessary to exclude mucosa abnormality. Scattered increased number of small lymph nodes possibly reactive in origin. CTA HEAD Mild irregularity of medium and large size vessels of the anterior circulation without high-grade stenosis. Mild to mild branch vessel irregularity. Moderate narrowing distal right vertebral artery. Mild irregularity distal left vertebral artery. Mild to moderate narrowing portions of the basilar artery. Posterior cerebral artery mild to moderate branch vessel irregularity and narrowing. Findings may indicate result of vasculopathy related to patient's drug use or secondary to underlying atherosclerotic changes. Electronically Signed   By: Genia Del M.D.   On: 12/13/2016 22:16   Ct Head Wo Contrast  Result Date: 12/13/2016 CLINICAL DATA:  Pt woke up about 2 hrs ago and was blind had a headache He is able to see images now EXAM: CT HEAD WITHOUT CONTRAST TECHNIQUE: Contiguous axial images were obtained from the base of the skull through the vertex without intravenous contrast. COMPARISON:  None. FINDINGS: Brain: No mass lesion, hemorrhage, hydrocephalus, acute infarct, intra-axial, or extra-axial fluid collection. Vascular: No hyperdense vessel or unexpected calcification. Skull: Normal Sinuses/Orbits: Normal orbits and globes. Mucosal  thickening of ethmoid air cells and left sphenoid sinus. Clear mastoid air cells. Other:  None IMPRESSION: 1.  No acute intracranial abnormality. 2. Sinus disease. Electronically Signed   By: Abigail Miyamoto M.D.   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 Dizziness. Injected herion this morning. Subsequent encounter. EXAM: CT ANGIOGRAPHY HEAD AND NECK TECHNIQUE: Multidetector CT imaging of the head and neck was performed using the standard protocol during bolus administration of intravenous contrast. Multiplanar CT image reconstructions and MIPs were obtained to evaluate the vascular anatomy. Carotid stenosis measurements (when applicable) are obtained utilizing NASCET criteria, using the distal internal carotid diameter as the denominator. CONTRAST:  75 cc Isovue 370. COMPARISON:  12/13/2016 head CT. FINDINGS: CT HEAD FINDINGS Brain: No intracranial hemorrhage or CT evidence of large acute infarct. No intracranial mass or enhancement.  No hydrocephalus. Vascular: As below Skull: No acute abnormality. Sinuses: Mucosal thickening left sphenoid sinus, maxillary sinuses bilaterally and opacification ethmoid sinus air cells bilaterally. Orbits: No acute abnormality. Review of the MIP images confirms the above findings CTA NECK FINDINGS Aortic arch: 3 vessel arch with mild plaque. Right carotid system: Slightly irregular appearance of the right  carotid artery throughout its course with mild narrowing which may represent changes of inflammation from drug abuse or atherosclerotic changes. No evidence of dissection or high-grade stenosis. Left carotid system: Slightly irregular appearance of the left carotid artery throughout its course with mild narrowing which may represent changes of inflammation from drug abuse or atherosclerotic change. No  evidence of dissection or high-grade stenosis. Vertebral arteries: Left vertebral artery is dominant. No high-grade stenosis of either vertebral artery. Skeleton: Prominent dental disease. Cervical spondylotic changes most notable C6-7. Other neck: Prominent palatine tonsils may indicate inflammation without discrete mass identified. Direct visualization would be necessary to exclude subtle mucosal abnormality. Scattered increased number of small lymph nodes possibly reactive in origin. Upper chest: No lung apical lesion. Review of the MIP images confirms the above findings CTA HEAD FINDINGS Anterior circulation: Mild irregularity of medium and large size vessels of the anterior circulation without high-grade stenosis. Mild to moderate branch vessel irregularity. Posterior circulation: Moderate narrowing distal right vertebral artery. Mild irregularity distal left vertebral artery. Mild to moderate narrowing portions of the basilar artery. Posterior cerebral artery mild to moderate branch vessel irregularity and narrowing. Venous sinuses: Patent. Anatomic variants: Fetal contribution to the right posterior cerebral artery. Delayed phase: As above. Review of the MIP images confirms the above findings IMPRESSION: CT HEAD No intracranial hemorrhage or CT evidence of large acute infarct. No intracranial mass or enhancement. Mucosal thickening left sphenoid sinus, maxillary sinuses bilaterally and opacification ethmoid sinus air cells bilaterally. CTA NECK Slightly irregular appearance of the carotid artery throughout its course bilaterally with mild narrowing which may represent changes of inflammation from drug abuse or atherosclerotic changes. No evidence of dissection or high-grade stenosis. Left vertebral artery is dominant. No high-grade stenosis of either vertebral artery. Prominent dental disease. Prominent palatine tonsils may indicate inflammation without discrete mass. Direct visualization would be necessary  to exclude mucosa abnormality. Scattered increased number of small lymph nodes possibly reactive in origin. CTA HEAD Mild irregularity of medium and large size vessels of the anterior circulation without high-grade stenosis. Mild to mild branch vessel irregularity. Moderate narrowing distal right vertebral artery. Mild irregularity distal left vertebral artery. Mild to moderate narrowing portions of the basilar artery. Posterior cerebral artery mild to moderate branch vessel irregularity and narrowing. Findings may indicate result of vasculopathy related to patient's drug use or secondary to underlying atherosclerotic changes. Electronically Signed   By: Genia Del M.D.   On: 12/13/2016 22:16   Mr Jeri Cos And Wo Contrast  Result Date: 12/14/2016 CLINICAL DATA:  Transient vision loss. Headache. Fever. Hairline abuse. EXAM: MRI HEAD WITHOUT AND WITH CONTRAST TECHNIQUE: Multiplanar, multiecho pulse sequences of the brain and surrounding structures were obtained without and with intravenous contrast. CONTRAST:  33m MULTIHANCE GADOBENATE DIMEGLUMINE 529 MG/ML IV SOLN COMPARISON:  Head CT/ CTA 12/13/2016 FINDINGS: The study is mildly motion degraded. Brain: There is a 2 mm punctate focus of restricted diffusion involving cortex or subcortical white matter in the posterior left temporal lobe (series 3, image 23). Similar punctate foci of restricted diffusion are questioned in the right temporal lobe (series 3, image 23) and right parietal lobe (series 9, image 7). There is no evidence of intracranial hemorrhage, mass, midline shift, or extra-axial fluid collection. The ventricles and sulci are normal. No significant chronic ischemic changes are identified within limitations of motion artifact. No abnormal enhancement is identified. Vascular: Major intracranial vascular flow voids are preserved. Skull and upper cervical spine: Unremarkable bone marrow signal. Sinuses/Orbits: Unremarkable orbits.  Mild mucosal  thickening throughout the paranasal sinuses. Small right mastoid effusion. Other: None. IMPRESSION: 1. 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 2. Questionable additional tiny embolic infarcts versus artifact in the right temporal and right parietal lobes. Electronically Signed   By: Logan Bores M.D.   On: 12/14/2016 08:18   Dg Chest Portable 1 View  Result Date: 12/13/2016 CLINICAL DATA:  Shortness of breath and productive cough for 2 weeks, worsening. EXAM: PORTABLE CHEST 1 VIEW COMPARISON:  PA and lateral chest 11/04/2016. FINDINGS: There is new right lower lobe airspace disease. Left lung is clear. Heart size is normal. No pneumothorax or pleural effusion. Remote left rib fracture is noted. IMPRESSION: Right lower lobe airspace disease most consistent with pneumonia. Recommend followup to clearing. Electronically Signed   By: Inge Rise M.D.   On: 12/13/2016 20:10   Recent Results (from the past 240 hour(s))  Blood culture (routine x 2)     Status: None (Preliminary result)   Collection Time: 12/13/16  7:20 PM  Result Value Ref Range Status   Specimen Description BLOOD RIGHT UPPER ARM  Final   Special Requests BOTTLES DRAWN AEROBIC AND ANAEROBIC 5CC EA  Final   Culture NO GROWTH < 12 HOURS  Final   Report Status PENDING  Incomplete  Blood culture (routine x 2)     Status: None (Preliminary result)   Collection Time: 12/13/16  7:30 PM  Result Value Ref Range Status   Specimen Description BLOOD LEFT HAND  Final   Special Requests BOTTLES DRAWN AEROBIC AND ANAEROBIC 5CC EA  Final   Culture NO GROWTH < 12 HOURS  Final   Report Status PENDING  Incomplete  MRSA PCR Screening     Status: None   Collection Time: 12/14/16 12:53 AM  Result Value Ref Range Status   MRSA by PCR NEGATIVE NEGATIVE Final    Comment:        The GeneXpert MRSA Assay (FDA approved for NASAL specimens only), is one component of a comprehensive MRSA colonization surveillance program. It is  not intended to diagnose MRSA infection nor to guide or monitor treatment for MRSA infections.       12/15/2016, 9:53 AM     LOS: 2 days    Records and images were personally reviewed where available.

## 2016-12-15 NOTE — Consult Note (Signed)
NEURO HOSPITALIST CONSULT NOTE   Requestig physician: Dr. Joseph ArtWoods  Reason for Consult: Punctate acute infarctions  History obtained from:   Patient and Chart     HPI:                                                                                                                                          Jesus Watkins is a 45 y.o. male who presented with vision loss after injecting heroin. Per chart review: "Earlier on 12/23, he took a hit of heroin and started having "[place] fevers" and nausea. He went to bed and woke up with vision loss bilaterally - he was only able to make out dark and light brightness but no visual acuity. [facility] as his symptoms gradually resolved completely. 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 Since discharge, he has been living in a hotel and has continued to use heroin. This admission, labs showed leukocytosis, lactic acidosis and oliguria. He has been hypotensive, with SBPs in the 80s-90s.   His vision is subjectively back to normal. He currently is experiencing heroin withdrawal symptoms.   Past Medical History:  Diagnosis Date  . Acid reflux   . Essential hypertension   . Hyperglycemia 10/2016    Past Surgical History:  Procedure Laterality Date  . bilateral arm abscesses    . DENTAL SURGERY    . I&D EXTREMITY Bilateral 11/05/2016   Procedure: IRRIGATION AND DEBRIDEMENT EXTREMITY;  Surgeon: Dominica SeverinWilliam Gramig, MD;  Location: [facility] OR;  Service: Orthopedics;   Laterality: Bilateral;  . I&D EXTREMITY Bilateral 11/06/2016   Procedure: IRRIGATION AND DEBRIDEMENT AND REPAIR OF ASSOCIATED STRUCTURES RIGHT AND LEFT ARMS;  Surgeon: Dominica SeverinWilliam Gramig, MD;  Location: [facility] OR;  Service: Orthopedics;  Laterality: Bilateral;    Family History  Problem Relation Age of Onset  . Family history unknown: Yes    Social History:  reports that he has been smoking Cigarettes.  He has been smoking about 0.50 packs per day. He has never used smokeless tobacco. He reports that he uses drugs, including IV and Heroin. He reports that he does not drink alcohol.  No Known Allergies  MEDICATIONS:  Current Facility-Administered Medications:  .  cloNIDine (CATAPRES - Dosed in mg/24 hr) patch 0.2 mg, 0.2 mg, Transdermal, Weekly, Drema Dallasurtis J Woods, MD, 0.2 mg at 12/15/16 1036 .  enoxaparin (LOVENOX) injection 40 mg, 40 mg, Subcutaneous, Q24H, Cornell BarmanArun Kannappan, MD, 40 mg at 12/15/16 0901 .  LORazepam (ATIVAN) tablet 1 mg, 1 mg, Oral, Q8H PRN, Oretha Milchakesh V Alva, MD, 1 mg at 12/15/16 1447 .  MEDLINE mouth rinse, 15 mL, Mouth Rinse, BID, Oretha Milchakesh V Alva, MD, 15 mL at 12/14/16 1000 .  methadone (DOLOPHINE) tablet 5 mg, 5 mg, Oral, Q8H, Drema Dallasurtis J Woods, MD, 5 mg at 12/15/16 1407 .  ondansetron (ZOFRAN) injection 4 mg, 4 mg, Intravenous, Q6H PRN, Drema Dallasurtis J Woods, MD, 4 mg at 12/15/16 1424 .  piperacillin-tazobactam (ZOSYN) IVPB 3.375 g, 3.375 g, Intravenous, Q8H, Lauren D Bajbus, RPH, 3.375 g at 12/15/16 1826 .  vancomycin (VANCOCIN) IVPB 1000 mg/200 mL premix, 1,000 mg, Intravenous, Q12H, Lauren D Bajbus, RPH, 1,000 mg at 12/15/16 0821   ROS:                                                                                                                                       History obtained from patient. Positive for chills, diaphoresis, nausea and anorexia.   Blood pressure 127/88,  pulse 73, temperature 98.5 F (36.9 C), temperature source Oral, resp. rate 20, height 5\' 8"  (1.727 m), weight 69.4 kg (153 lb), SpO2 95 %.  General Examination:                                                                                                      HEENT-  Normocephalic/atraumatic. Lungs- No gross wheezing.  Extremities- Cool clammy skin all 4 extremities.   Neurological Examination Mental Status: Alert, oriented. Flattened, anxious affect with monotone quality of speech. No dysarthria.  Speech fluent without receptive or expressive aphasia.  Able to follow all commands without difficulty. Restlessness/akathisia is noted.  Cranial Nerves: II: Visual fields grossly intact to bedside confrontation. PERRL. Fixates normally on visual stimuli.  III,IV, VI: ptosis not present, EOMI.  V,VII: facial motor function symmetric, facial temperature sensation normal bilaterally VIII: hearing intact to conversation IX,X: no hypophonia or hoarseness XI: symmetric XII: midline tongue extension Motor: Right : Upper extremity   5/5    Left:     Upper extremity   5/5  Lower extremity   5/5     Lower extremity   5/5 Normal tone throughout; no atrophy noted  Sensory: Temperature and light touch intact throughout, bilaterally Deep Tendon Reflexes: 2+ and symmetric throughout Cerebellar: Normal FNF bilaterally.  Gait: Deferred   Lab Results: Basic Metabolic Panel:  Recent Labs Lab 12/13/16 1716 12/13/16 1735 12/14/16 0528 12/15/16 0348  NA 135 135 137 139  K 4.1 4.1 4.1 3.5  CL 99* 99* 106 106  CO2 22  --  20* 24  GLUCOSE 158* 161* 145* 88  BUN 21* 22* 23* 19  CREATININE 1.74* 1.40* 1.66* 1.21  CALCIUM 8.9  --  7.4* 8.1*  MG  --   --  1.4*  --   PHOS  --   --  4.0  --     Liver Function Tests:  Recent Labs Lab 12/13/16 1716  AST 120*  ALT 39  ALKPHOS 235*  BILITOT 0.8  PROT 6.6  ALBUMIN 3.2*   No results for input(s): LIPASE, AMYLASE in the last 168 hours. No results  for input(s): AMMONIA in the last 168 hours.  CBC:  Recent Labs Lab 12/13/16 1716 12/13/16 1735 12/14/16 0528 12/15/16 0348  WBC 14.7*  --  28.4* 27.4*  NEUTROABS 14.3*  --   --   --   HGB 11.1* 11.2* 9.8* 9.0*  HCT 34.9* 33.0* 30.8* 28.1*  MCV 85.7  --  83.9 84.6  PLT 228  --  170 185    Cardiac Enzymes:  Recent Labs Lab 12/15/16 1052  TROPONINI <0.03    Lipid Panel: No results for input(s): CHOL, TRIG, HDL, CHOLHDL, VLDL, LDLCALC in the last 168 hours.  CBG: No results for input(s): GLUCAP in the last 168 hours.  Microbiology: Results for orders placed or performed during the hospital encounter of 12/13/16  Blood culture (routine x 2)     Status: None (Preliminary result)   Collection Time: 12/13/16  7:20 PM  Result Value Ref Range Status   Specimen Description BLOOD RIGHT UPPER ARM  Final   Special Requests BOTTLES DRAWN AEROBIC AND ANAEROBIC 5CC EA  Final   Culture NO GROWTH < 12 HOURS  Final   Report Status PENDING  Incomplete  Blood culture (routine x 2)     Status: None (Preliminary result)   Collection Time: 12/13/16  7:30 PM  Result Value Ref Range Status   Specimen Description BLOOD LEFT HAND  Final   Special Requests BOTTLES DRAWN AEROBIC AND ANAEROBIC 5CC EA  Final   Culture NO GROWTH < 12 HOURS  Final   Report Status PENDING  Incomplete  Urine culture     Status: None   Collection Time: 12/13/16 11:10 PM  Result Value Ref Range Status   Specimen Description URINE, CATHETERIZED  Final   Special Requests NONE  Final   Culture NO GROWTH  Final   Report Status 12/15/2016 FINAL  Final  MRSA PCR Screening     Status: None   Collection Time: 12/14/16 12:53 AM  Result Value Ref Range Status   MRSA by PCR NEGATIVE NEGATIVE Final    Comment:        The GeneXpert MRSA Assay (FDA approved for NASAL specimens only), is one component of a comprehensive MRSA colonization surveillance program. It is not intended to diagnose MRSA infection nor to guide  or monitor treatment for MRSA infections.     Coagulation Studies:  Recent Labs  12/13/16 1716  LABPROT 15.8*  INR 1.25    Imaging: Ct Angio Head W Or Wo Contrast  Result Date: 12/13/2016 CLINICAL DATA:  45 year old hypertensive male with  history of herion abuse awoke from a nap only able to see light from both eyes. Dizziness. Injected herion this morning. Subsequent encounter. EXAM: CT ANGIOGRAPHY HEAD AND NECK TECHNIQUE: Multidetector CT imaging of the head and neck was performed using the standard protocol during bolus administration of intravenous contrast. Multiplanar CT image reconstructions and MIPs were obtained to evaluate the vascular anatomy. Carotid stenosis measurements (when applicable) are obtained utilizing NASCET criteria, using the distal internal carotid diameter as the denominator. CONTRAST:  75 cc Isovue 370. COMPARISON:  12/13/2016 head CT. FINDINGS: CT HEAD FINDINGS Brain: No intracranial hemorrhage or CT evidence of large acute infarct. No intracranial mass or enhancement.  No hydrocephalus. Vascular: As below Skull: No acute abnormality. Sinuses: Mucosal thickening left sphenoid sinus, maxillary sinuses bilaterally and opacification ethmoid sinus air cells bilaterally. Orbits: No acute abnormality. Review of the MIP images confirms the above findings CTA NECK FINDINGS Aortic arch: 3 vessel arch with mild plaque. Right carotid system: Slightly irregular appearance of the right carotid artery throughout its course with mild narrowing which may represent changes of inflammation from drug abuse or atherosclerotic changes. No evidence of dissection or high-grade stenosis. Left carotid system: Slightly irregular appearance of the left carotid artery throughout its course with mild narrowing which may represent changes of inflammation from drug abuse or atherosclerotic change. No evidence of dissection or high-grade stenosis. Vertebral arteries: Left vertebral artery is  dominant. No high-grade stenosis of either vertebral artery. Skeleton: Prominent dental disease. Cervical spondylotic changes most notable C6-7. Other neck: Prominent palatine tonsils may indicate inflammation without discrete mass identified. Direct visualization would be necessary to exclude subtle mucosal abnormality. Scattered increased number of small lymph nodes possibly reactive in origin. Upper chest: No lung apical lesion. Review of the MIP images confirms the above findings CTA HEAD FINDINGS Anterior circulation: Mild irregularity of medium and large size vessels of the anterior circulation without high-grade stenosis. Mild to moderate branch vessel irregularity. Posterior circulation: Moderate narrowing distal right vertebral artery. Mild irregularity distal left vertebral artery. Mild to moderate narrowing portions of the basilar artery. Posterior cerebral artery mild to moderate branch vessel irregularity and narrowing. Venous sinuses: Patent. Anatomic variants: Fetal contribution to the right posterior cerebral artery. Delayed phase: As above. Review of the MIP images confirms the above findings IMPRESSION: CT HEAD No intracranial hemorrhage or CT evidence of large acute infarct. No intracranial mass or enhancement. Mucosal thickening left sphenoid sinus, maxillary sinuses bilaterally and opacification ethmoid sinus air cells bilaterally. CTA NECK Slightly irregular appearance of the carotid artery throughout its course bilaterally with mild narrowing which may represent changes of inflammation from drug abuse or atherosclerotic changes. No evidence of dissection or high-grade stenosis. Left vertebral artery is dominant. No high-grade stenosis of either vertebral artery. Prominent dental disease. Prominent palatine tonsils may indicate inflammation without discrete mass. Direct visualization would be necessary to exclude mucosa abnormality. Scattered increased number of small lymph nodes possibly  reactive in origin. CTA HEAD Mild irregularity of medium and large size vessels of the anterior circulation without high-grade stenosis. Mild to mild branch vessel irregularity. Moderate narrowing distal right vertebral artery. Mild irregularity distal left vertebral artery. Mild to moderate narrowing portions of the basilar artery. Posterior cerebral artery mild to moderate branch vessel irregularity and narrowing. Findings may indicate result of vasculopathy related to patient's drug use or secondary to underlying atherosclerotic changes. Electronically Signed   By: Lacy Duverney M.D.   On: 12/13/2016 22:16   Ct Head Wo Contrast  Result Date: 12/13/2016 CLINICAL DATA:  Pt woke up about 2 hrs ago and was blind had a headache He is able to see images now EXAM: CT HEAD WITHOUT CONTRAST TECHNIQUE: Contiguous axial images were obtained from the base of the skull through the vertex without intravenous contrast. COMPARISON:  None. FINDINGS: Brain: No mass lesion, hemorrhage, hydrocephalus, acute infarct, intra-axial, or extra-axial fluid collection. Vascular: No hyperdense vessel or unexpected calcification. Skull: Normal Sinuses/Orbits: Normal orbits and globes. Mucosal thickening of ethmoid air cells and left sphenoid sinus. Clear mastoid air cells. Other:  None IMPRESSION: 1.  No acute intracranial abnormality. 2. Sinus disease. Electronically Signed   By: Jeronimo Greaves M.D.   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 Dizziness. Injected herion this morning. Subsequent encounter. EXAM: CT ANGIOGRAPHY HEAD AND NECK TECHNIQUE: Multidetector CT imaging of the head and neck was performed using the standard protocol during bolus administration of intravenous contrast. Multiplanar CT image reconstructions and MIPs were obtained to evaluate the  vascular anatomy. Carotid stenosis measurements (when applicable) are obtained utilizing NASCET criteria, using the distal internal carotid diameter as the denominator. CONTRAST:  75 cc Isovue 370. COMPARISON:  12/13/2016 head CT. FINDINGS: CT HEAD FINDINGS Brain: No intracranial hemorrhage or CT evidence of large acute infarct. No intracranial mass or enhancement.  No hydrocephalus. Vascular: As below Skull: No acute abnormality. Sinuses: Mucosal thickening left sphenoid sinus, maxillary sinuses bilaterally and opacification ethmoid sinus air cells bilaterally. Orbits: No acute abnormality. Review of the MIP images confirms the above findings CTA NECK FINDINGS Aortic arch: 3 vessel arch with mild plaque. Right carotid system: Slightly irregular appearance of the right carotid artery throughout its course with mild narrowing which may represent changes of inflammation from drug abuse or atherosclerotic changes. No evidence of dissection or high-grade stenosis. Left carotid system: Slightly irregular appearance of the left carotid artery throughout its course with mild narrowing which may represent changes of inflammation from drug abuse or atherosclerotic change. No evidence of dissection or high-grade stenosis. Vertebral arteries: Left vertebral artery is dominant. No high-grade stenosis of either vertebral artery. Skeleton: Prominent dental disease. Cervical spondylotic changes most notable C6-7. Other neck: Prominent palatine tonsils may indicate inflammation without discrete mass identified. Direct visualization would be necessary to exclude subtle mucosal abnormality. Scattered increased number of small lymph nodes possibly reactive in origin. Upper chest: No lung apical lesion. Review of the MIP images confirms the above findings CTA HEAD FINDINGS Anterior circulation: Mild irregularity of medium and large size vessels of the anterior circulation without high-grade stenosis. Mild to moderate branch vessel  irregularity. Posterior circulation: Moderate narrowing distal right vertebral artery. Mild irregularity distal left vertebral artery. Mild to moderate narrowing portions of the basilar artery. Posterior cerebral artery mild to moderate branch vessel irregularity and narrowing. Venous sinuses: Patent. Anatomic variants: Fetal contribution to the right posterior cerebral artery. Delayed phase: As above. Review of the MIP images confirms the above findings IMPRESSION: CT HEAD No intracranial hemorrhage or CT evidence of large acute infarct. No intracranial mass or enhancement. Mucosal thickening left sphenoid sinus, maxillary sinuses bilaterally and opacification ethmoid sinus air cells bilaterally. CTA NECK Slightly irregular appearance of the carotid artery throughout its course bilaterally with mild narrowing which may represent changes of inflammation from drug abuse or atherosclerotic changes. No evidence of  dissection or high-grade stenosis. Left vertebral artery is dominant. No high-grade stenosis of either vertebral artery. Prominent dental disease. Prominent palatine tonsils may indicate inflammation without discrete mass. Direct visualization would be necessary to exclude mucosa abnormality. Scattered increased number of small lymph nodes possibly reactive in origin. CTA HEAD Mild irregularity of medium and large size vessels of the anterior circulation without high-grade stenosis. Mild to mild branch vessel irregularity. Moderate narrowing distal right vertebral artery. Mild irregularity distal left vertebral artery. Mild to moderate narrowing portions of the basilar artery. Posterior cerebral artery mild to moderate branch vessel irregularity and narrowing. Findings may indicate result of vasculopathy related to patient's drug use or secondary to underlying atherosclerotic changes. Electronically Signed   By: Lacy Duverney M.D.   On: 12/13/2016 22:16   Mr Laqueta Jean And Wo Contrast  Result Date:  12/14/2016 CLINICAL DATA:  Transient vision loss. Headache. Fever. Hairline abuse. EXAM: MRI HEAD WITHOUT AND WITH CONTRAST TECHNIQUE: Multiplanar, multiecho pulse sequences of the brain and surrounding structures were obtained without and with intravenous contrast. CONTRAST:  14mL MULTIHANCE GADOBENATE DIMEGLUMINE 529 MG/ML IV SOLN COMPARISON:  Head CT/ CTA 12/13/2016 FINDINGS: The study is mildly motion degraded. Brain: There is a 2 mm punctate focus of restricted diffusion involving cortex or subcortical white matter in the posterior left temporal lobe (series 3, image 23). Similar punctate foci of restricted diffusion are questioned in the right temporal lobe (series 3, image 23) and right parietal lobe (series 9, image 7). There is no evidence of intracranial hemorrhage, mass, midline shift, or extra-axial fluid collection. The ventricles and sulci are normal. No significant chronic ischemic changes are identified within limitations of motion artifact. No abnormal enhancement is identified. Vascular: Major intracranial vascular flow voids are preserved. Skull and upper cervical spine: Unremarkable bone marrow signal. Sinuses/Orbits: Unremarkable orbits. Mild mucosal thickening throughout the paranasal sinuses. Small right mastoid effusion. Other: None. IMPRESSION: 1. 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 2. Questionable additional tiny embolic infarcts versus artifact in the right temporal and right parietal lobes. Electronically Signed   By: Sebastian Ache M.D.   On: 12/14/2016 08:18   Dg Chest Portable 1 View  Result Date: 12/13/2016 CLINICAL DATA:  Shortness of breath and productive cough for 2 weeks, worsening. EXAM: PORTABLE CHEST 1 VIEW COMPARISON:  PA and lateral chest 11/04/2016. FINDINGS: There is new right lower lobe airspace disease. Left lung is clear. Heart size is normal. No pneumothorax or pleural effusion. Remote left rib fracture is noted. IMPRESSION: Right lower lobe  airspace disease most consistent with pneumonia. Recommend followup to clearing. Electronically Signed   By: Drusilla Kanner M.D.   On: 12/13/2016 20:10    Assessment: 1. Acute vision loss, now resolved. Most likely secondary to occipital lobe hypoperfusion in the setting of severe hypotension at presentation.  2. 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 These findings are suspicious for septic emboli given his ongoing IV drug abuse and history of infectious complications from such.  3. 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   Recommendations: 1. TTE to assess for vegetations. If negative, will need TEE.  2. Blood cultures x 2.  3. Currently on IV Zosyn and vancomycin.  4. Drug cessation counseling.  Electronically signed: Dr. Caryl Pina 12/15/2016, 12:39 PM

## 2016-12-15 NOTE — Progress Notes (Signed)
  Echocardiogram 2D Echocardiogram has been performed.  Janalyn HarderWest, Averil Digman R 12/15/2016, 8:52 AM

## 2016-12-15 NOTE — Progress Notes (Signed)
Transferred patient to 6E23 in wheelchair, patient in bed, placed on tele, bed alarm on, call bell in reach. Receiving RN at bedside, no questions from RN.  Hermina BartersBOWMAN, Saga Balthazar M, RN

## 2016-12-15 NOTE — Progress Notes (Signed)
PROGRESS NOTE    Jesus Watkins  HKV:[id number] DOB: 06/19/1971 DOA: 12/13/2016 PCP: No PCP Per Patient   Brief Narrative:  45 y.o. WM PMHx Heroin Abuse, bilateral Compartment Syndrome of Arms, Hyperglycemia  Presented to the [facility] ED with vision loss. 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 After discharge, he has been living in a hotel nearby in [place] and continued to use heroin. Earlier on 12/23, he took a hit of heroin and started having "[place] fevers" and nausea. He went to bed and woke up with vision loss bilaterally - he was only able to make out dark and light brightness but no visual acuity. His friend drove him to the [facility] ED as his symptoms gradually resolved completely. In the ED, he had a CT head which was negative for acute pathology followed up by a CTA Head and Neck showing vessel aberrancy consistent with IV drug use. He was noted to have a leukocytosis, lactic acidosis and oliguira in the setting of SBPs in the 80s-90s. He was given 4 liters of normal saline and had an improvement in his lactic acidosis from 5 to 2.4. At the time of interview, he was diaphoretic and complaining of being cold. His vision was subjectively back to normal.    Subjective: 12/25  A/O 4, positive N/V, positive diaphoresis, positive tachycardic, positive tachypnea. Patient clearly with drawing     Assessment & Plan:   Active Problems:   Septic shock (HCC)   Severe sepsis (HCC)   Heroin abuse/Heroin withdrawal -evaluate and recorded in Epic Clinical Opioid Withdrawal Scale (COWS ) q 2hr -Methadone 5 mg TID -Clonidine patch 0.2 mg -Zofran  Sepsis Pneumonia(HCAP) vs Endocarditis vs Bacteremia -Continue current antibiotics -Blood culture, sputum pending -Echocardiogram pending -Troponins pending -ID consulted  Hypotension, -Most likely multifactorial to include severe sepsis, drug  overdose -Resolved  Acute renal failure Lab Results  Component Value Date   CREATININE 1.21 12/15/2016   CREATININE 1.66 (H) 12/14/2016   CREATININE 1.40 (H) 12/13/2016  -Resolved  Acute Embolic Infarct Left Temporal Lobe   -Acute vision loss- resolved.  -Septic emboli or vasospasm remain concerns with IV drug use.  -CTA Head and Neck suggest vasculopathy.  -Per note Neurology consulted however no note in chart. Addendum spoke with Dr.Lindzen Neurology who will see patient.  -Will need ophthalmology consult as an outpatient    DVT prophylaxis: Lovenox Code Status: Full Family Communication: None Disposition Plan: ??   Consultants:  PCCM    Procedures/Significant Events:  12/23 CT Angiogram Head/Neck: Negative for acute infarct  -Slightly irregular appearance of the carotid arterymay represent changes of inflammation from drug abuse or atherosclerotic changes  -Prominent dental disease. -Prominent palatine tonsils may indicate inflammation without discrete mass.  12/24 MRI Brainw/wo contrast: Punctate acute embolic infarct in the left temporal lobe. 2. Questionable additional tiny embolic infarcts versus artifact in the right temporal and right parietal lobes   VENTILATOR SETTINGS:    Cultures 12/23 blood 2 NGTD 12/23 urine pending 12/24 MRSA by PCR negative    Antimicrobials: Anti-infectives    Start     Stop   12/14/16 0800  vancomycin (VANCOCIN) IVPB 1000 mg/200 mL premix         12/14/16 0200  piperacillin-tazobactam (ZOSYN) IVPB 3.375 g         12/13/16 2015  piperacillin-tazobactam (ZOSYN) IVPB 3.375 g     12/13/16 2136   12/13/16  2015  vancomycin (VANCOCIN) IVPB 1000 mg/200 mL premix     12/13/16 2229       Devices    LINES / TUBES:      Continuous Infusions:   Objective: Vitals:   12/15/16 0300 12/15/16 0400 12/15/16 0408 12/15/16 0500  BP: 124/72 127/89  (!) 123/91  Pulse: 80 81  77  Resp: 18 19  19   Temp:   98.1 F  (36.7 C)   TempSrc:   Oral   SpO2: 90% (!) 89%  90%  Weight:      Height:        Intake/Output Summary (Last 24 hours) at 12/15/16 [id number] Last data filed at 12/15/16 0500  Gross per 24 hour  Intake              580 ml  Output             5330 ml  Net            -4750 ml   Filed Weights   12/13/16 1717 12/14/16 0103  Weight: 70.3 kg (155 lb) 72 kg (158 lb 11.7 oz)    Examination:  General: A/O 4, F/C/S, diaphoretic. No acute respiratory distress Eyes: negative scleral hemorrhage, negative anisocoria, negative icterus ENT: Extremely poor dentation.  Neck:  Negative scars, masses, torticollis, lymphadenopathy, JVD Lungs: tachypneic, Clear to auscultation bilaterally without wheezes or crackles Cardiovascular: Tachycardic, Regular rhythm without murmur gallop or rub normal S1 and S2 Abdomen: negative abdominal pain, nondistended, positive soft, bowel sounds, no rebound, no ascites, no appreciable mass Extremities: No significant cyanosis, clubbing, or edema bilateral lower extremities Skin: Negative rashes, lesions, ulcers Psychiatric:  Negative depression, negative anxiety, negative fatigue, negative mania  Central nervous system:  Cranial nerves II through XII intact, tongue/uvula midline, all extremities muscle strength 5/5, sensation intact throughout, negative dysarthria, negative expressive aphasia, negative receptive aphasia.  .     Data Reviewed: Care during the described time interval was provided by me .  I have reviewed this patient's available data, including medical history, events of note, physical examination, and all test results as part of my evaluation. I have personally reviewed and interpreted all radiology studies.  CBC:  Recent Labs Lab 12/13/16 1716 12/13/16 1735 12/14/16 0528 12/15/16 0348  WBC 14.7*  --  28.4* 27.4*  NEUTROABS 14.3*  --   --   --   HGB 11.1* 11.2* 9.8* 9.0*  HCT 34.9* 33.0* 30.8* 28.1*  MCV 85.7  --  83.9 84.6  PLT 228  --  170  185   Basic Metabolic Panel:  Recent Labs Lab 12/13/16 1716 12/13/16 1735 12/14/16 0528 12/15/16 0348  NA 135 135 137 139  K 4.1 4.1 4.1 3.5  CL 99* 99* 106 106  CO2 22  --  20* 24  GLUCOSE 158* 161* 145* 88  BUN 21* 22* 23* 19  CREATININE 1.74* 1.40* 1.66* 1.21  CALCIUM 8.9  --  7.4* 8.1*  MG  --   --  1.4*  --   PHOS  --   --  4.0  --    GFR: Estimated Creatinine Clearance: 75.4 mL/min (by C-G formula based on SCr of 1.21 mg/dL). Liver Function Tests:  Recent Labs Lab 12/13/16 1716  AST 120*  ALT 39  ALKPHOS 235*  BILITOT 0.8  PROT 6.6  ALBUMIN 3.2*   No results for input(s): LIPASE, AMYLASE in the last 168 hours. No results for input(s): AMMONIA in the last 168 hours. Coagulation  Profile:  Recent Labs Lab 12/13/16 1716  INR 1.25   Cardiac Enzymes: No results for input(s): CKTOTAL, CKMB, CKMBINDEX, TROPONINI in the last 168 hours. BNP (last 3 results) No results for input(s): PROBNP in the last 8760 hours. HbA1C: No results for input(s): HGBA1C in the last 72 hours. CBG: No results for input(s): GLUCAP in the last 168 hours. Lipid Profile: No results for input(s): CHOL, HDL, LDLCALC, TRIG, CHOLHDL, LDLDIRECT in the last 72 hours. Thyroid Function Tests: No results for input(s): TSH, T4TOTAL, FREET4, T3FREE, THYROIDAB in the last 72 hours. Anemia Panel: No results for input(s): VITAMINB12, FOLATE, FERRITIN, TIBC, IRON, RETICCTPCT in the last 72 hours. Urine analysis:    Component Value Date/Time   COLORURINE YELLOW 12/13/2016 2310   APPEARANCEUR HAZY (A) 12/13/2016 2310   LABSPEC 1.034 (H) 12/13/2016 2310   PHURINE 5.0 12/13/2016 2310   GLUCOSEU NEGATIVE 12/13/2016 2310   HGBUR SMALL (A) 12/13/2016 2310   BILIRUBINUR NEGATIVE 12/13/2016 2310   KETONESUR NEGATIVE 12/13/2016 2310   PROTEINUR 100 (A) 12/13/2016 2310   NITRITE NEGATIVE 12/13/2016 2310   LEUKOCYTESUR NEGATIVE 12/13/2016 2310   Sepsis  Labs: @LABRCNTIP (procalcitonin:4,lacticidven:4)  ) Recent Results (from the past 240 hour(s))  Blood culture (routine x 2)     Status: None (Preliminary result)   Collection Time: 12/13/16  7:20 PM  Result Value Ref Range Status   Specimen Description BLOOD RIGHT UPPER ARM  Final   Special Requests BOTTLES DRAWN AEROBIC AND ANAEROBIC 5CC EA  Final   Culture NO GROWTH < 12 HOURS  Final   Report Status PENDING  Incomplete  Blood culture (routine x 2)     Status: None (Preliminary result)   Collection Time: 12/13/16  7:30 PM  Result Value Ref Range Status   Specimen Description BLOOD LEFT HAND  Final   Special Requests BOTTLES DRAWN AEROBIC AND ANAEROBIC 5CC EA  Final   Culture NO GROWTH < 12 HOURS  Final   Report Status PENDING  Incomplete  MRSA PCR Screening     Status: None   Collection Time: 12/14/16 12:53 AM  Result Value Ref Range Status   MRSA by PCR NEGATIVE NEGATIVE Final    Comment:        The GeneXpert MRSA Assay (FDA approved for NASAL specimens only), is one component of a comprehensive MRSA colonization surveillance program. It is not intended to diagnose MRSA infection nor to guide or monitor treatment for MRSA infections.          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 Dizziness. Injected herion this morning. Subsequent encounter. EXAM: CT ANGIOGRAPHY HEAD AND NECK TECHNIQUE: Multidetector CT imaging of the head and neck was performed using the standard protocol during bolus administration of intravenous contrast. Multiplanar CT image reconstructions and MIPs were obtained to evaluate the vascular anatomy. Carotid stenosis measurements (when applicable) are obtained utilizing NASCET criteria, using the distal internal carotid diameter as the denominator. CONTRAST:  75 cc Isovue 370. COMPARISON:   12/13/2016 head CT. FINDINGS: CT HEAD FINDINGS Brain: No intracranial hemorrhage or CT evidence of large acute infarct. No intracranial mass or enhancement.  No hydrocephalus. Vascular: As below Skull: No acute abnormality. Sinuses: Mucosal thickening left sphenoid sinus, maxillary sinuses bilaterally and opacification ethmoid sinus air cells bilaterally. Orbits: No acute abnormality. Review of the MIP images confirms  the above findings CTA NECK FINDINGS Aortic arch: 3 vessel arch with mild plaque. Right carotid system: Slightly irregular appearance of the right carotid artery throughout its course with mild narrowing which may represent changes of inflammation from drug abuse or atherosclerotic changes. No evidence of dissection or high-grade stenosis. Left carotid system: Slightly irregular appearance of the left carotid artery throughout its course with mild narrowing which may represent changes of inflammation from drug abuse or atherosclerotic change. No evidence of dissection or high-grade stenosis. Vertebral arteries: Left vertebral artery is dominant. No high-grade stenosis of either vertebral artery. Skeleton: Prominent dental disease. Cervical spondylotic changes most notable C6-7. Other neck: Prominent palatine tonsils may indicate inflammation without discrete mass identified. Direct visualization would be necessary to exclude subtle mucosal abnormality. Scattered increased number of small lymph nodes possibly reactive in origin. Upper chest: No lung apical lesion. Review of the MIP images confirms the above findings CTA HEAD FINDINGS Anterior circulation: Mild irregularity of medium and large size vessels of the anterior circulation without high-grade stenosis. Mild to moderate branch vessel irregularity. Posterior circulation: Moderate narrowing distal right vertebral artery. Mild irregularity distal left vertebral artery. Mild to moderate narrowing portions of the basilar artery. Posterior cerebral  artery mild to moderate branch vessel irregularity and narrowing. Venous sinuses: Patent. Anatomic variants: Fetal contribution to the right posterior cerebral artery. Delayed phase: As above. Review of the MIP images confirms the above findings IMPRESSION: CT HEAD No intracranial hemorrhage or CT evidence of large acute infarct. No intracranial mass or enhancement. Mucosal thickening left sphenoid sinus, maxillary sinuses bilaterally and opacification ethmoid sinus air cells bilaterally. CTA NECK Slightly irregular appearance of the carotid artery throughout its course bilaterally with mild narrowing which may represent changes of inflammation from drug abuse or atherosclerotic changes. No evidence of dissection or high-grade stenosis. Left vertebral artery is dominant. No high-grade stenosis of either vertebral artery. Prominent dental disease. Prominent palatine tonsils may indicate inflammation without discrete mass. Direct visualization would be necessary to exclude mucosa abnormality. Scattered increased number of small lymph nodes possibly reactive in origin. CTA HEAD Mild irregularity of medium and large size vessels of the anterior circulation without high-grade stenosis. Mild to mild branch vessel irregularity. Moderate narrowing distal right vertebral artery. Mild irregularity distal left vertebral artery. Mild to moderate narrowing portions of the basilar artery. Posterior cerebral artery mild to moderate branch vessel irregularity and narrowing. Findings may indicate result of vasculopathy related to patient's drug use or secondary to underlying atherosclerotic changes. Electronically Signed   By: Lacy Duverney M.D.   On: 12/13/2016 22:16   Ct Head Wo Contrast  Result Date: 12/13/2016 CLINICAL DATA:  Pt woke up about 2 hrs ago and was blind had a headache He is able to see images now EXAM: CT HEAD WITHOUT CONTRAST TECHNIQUE: Contiguous axial images were obtained from the base of the skull through  the vertex without intravenous contrast. COMPARISON:  None. FINDINGS: Brain: No mass lesion, hemorrhage, hydrocephalus, acute infarct, intra-axial, or extra-axial fluid collection. Vascular: No hyperdense vessel or unexpected calcification. Skull: Normal Sinuses/Orbits: Normal orbits and globes. Mucosal thickening of ethmoid air cells and left sphenoid sinus. Clear mastoid air cells. Other:  None IMPRESSION: 1.  No acute intracranial abnormality. 2. Sinus disease. Electronically Signed   By: Jeronimo Greaves M.D.   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 Dizziness. Injected herion this morning. Subsequent encounter. EXAM: CT ANGIOGRAPHY HEAD AND NECK TECHNIQUE: Multidetector CT imaging of the head and neck was performed using the standard protocol during bolus administration of intravenous contrast. Multiplanar CT image reconstructions and MIPs were obtained to evaluate the vascular anatomy. Carotid stenosis measurements (when applicable) are obtained utilizing NASCET criteria, using the distal internal carotid diameter as the denominator. CONTRAST:  75 cc Isovue 370. COMPARISON:  12/13/2016 head CT. FINDINGS: CT HEAD FINDINGS Brain: No intracranial hemorrhage or CT evidence of large acute infarct. No intracranial mass or enhancement.  No hydrocephalus. Vascular: As below Skull: No acute abnormality. Sinuses: Mucosal thickening left sphenoid sinus, maxillary sinuses bilaterally and opacification ethmoid sinus air cells bilaterally. Orbits: No acute abnormality. Review of the MIP images confirms the above findings CTA NECK FINDINGS Aortic arch: 3 vessel arch with mild plaque. Right carotid system: Slightly irregular appearance of the right carotid artery throughout its course with mild narrowing which may represent changes of inflammation from  drug abuse or atherosclerotic changes. No evidence of dissection or high-grade stenosis. Left carotid system: Slightly irregular appearance of the left carotid artery throughout its course with mild narrowing which may represent changes of inflammation from drug abuse or atherosclerotic change. No evidence of dissection or high-grade stenosis. Vertebral arteries: Left vertebral artery is dominant. No high-grade stenosis of either vertebral artery. Skeleton: Prominent dental disease. Cervical spondylotic changes most notable C6-7. Other neck: Prominent palatine tonsils may indicate inflammation without discrete mass identified. Direct visualization would be necessary to exclude subtle mucosal abnormality. Scattered increased number of small lymph nodes possibly reactive in origin. Upper chest: No lung apical lesion. Review of the MIP images confirms the above findings CTA HEAD FINDINGS Anterior circulation: Mild irregularity of medium and large size vessels of the anterior circulation without high-grade stenosis. Mild to moderate branch vessel irregularity. Posterior circulation: Moderate narrowing distal right vertebral artery. Mild irregularity distal left vertebral artery. Mild to moderate narrowing portions of the basilar artery. Posterior cerebral artery mild to moderate branch vessel irregularity and narrowing. Venous sinuses: Patent. Anatomic variants: Fetal contribution to the right posterior cerebral artery. Delayed phase: As above. Review of the MIP images confirms the above findings IMPRESSION: CT HEAD No intracranial hemorrhage or CT evidence of large acute infarct. No intracranial mass or enhancement. Mucosal thickening left sphenoid sinus, maxillary sinuses bilaterally and opacification ethmoid sinus air cells bilaterally. CTA NECK Slightly irregular appearance of the carotid artery throughout its course bilaterally with mild narrowing which may represent changes of inflammation from drug abuse or  atherosclerotic changes. No evidence of dissection or high-grade stenosis. Left vertebral artery is dominant. No high-grade stenosis of either vertebral artery. Prominent dental disease. Prominent palatine tonsils may indicate inflammation without discrete mass. Direct visualization would be necessary to exclude mucosa abnormality. Scattered increased number of small lymph nodes possibly reactive in origin. CTA HEAD Mild irregularity of medium and large size vessels of the anterior circulation without high-grade stenosis. Mild to mild branch vessel irregularity. Moderate narrowing distal right vertebral artery. Mild irregularity distal left vertebral artery. Mild to moderate narrowing portions of the basilar artery. Posterior cerebral artery mild to moderate branch vessel irregularity and narrowing. Findings may indicate result of vasculopathy related to patient's drug use or secondary to underlying atherosclerotic changes. Electronically Signed   By: Lacy DuverneySteven  Olson M.D.   On: 12/13/2016 22:16   Mr Laqueta JeanBrain W And Wo Contrast  Result Date: 12/14/2016 CLINICAL DATA:  Transient vision loss. Headache. Fever. Hairline abuse. EXAM: MRI HEAD WITHOUT AND WITH CONTRAST TECHNIQUE: Multiplanar, multiecho pulse sequences of the brain and surrounding structures were obtained without and with intravenous contrast. CONTRAST:  14mL MULTIHANCE GADOBENATE DIMEGLUMINE 529 MG/ML IV SOLN COMPARISON:  Head CT/ CTA 12/13/2016 FINDINGS: The study is mildly motion degraded. Brain: There is a 2 mm punctate focus of restricted diffusion involving cortex or subcortical white matter in the posterior left temporal lobe (series 3, image 23). Similar punctate foci of restricted diffusion are questioned in the right temporal lobe (series 3, image 23) and right parietal lobe (series 9, image 7). There is no evidence of intracranial hemorrhage, mass, midline shift, or extra-axial fluid collection. The ventricles and sulci are normal. No significant  chronic ischemic changes are identified within limitations of motion artifact. No abnormal enhancement is identified. Vascular: Major intracranial vascular flow voids are preserved. Skull and upper cervical spine: Unremarkable bone marrow signal. Sinuses/Orbits: Unremarkable orbits. Mild mucosal thickening throughout the paranasal sinuses. Small right mastoid effusion. Other: None. IMPRESSION: 1. 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 2. Questionable additional tiny embolic infarcts versus artifact in the right temporal and right parietal lobes. Electronically Signed   By: Sebastian Ache M.D.   On: 12/14/2016 08:18   Dg Chest Portable 1 View  Result Date: 12/13/2016 CLINICAL DATA:  Shortness of breath and productive cough for 2 weeks, worsening. EXAM: PORTABLE CHEST 1 VIEW COMPARISON:  PA and lateral chest 11/04/2016. FINDINGS: There is new right lower lobe airspace disease. Left lung is clear. Heart size is normal. No pneumothorax or pleural effusion. Remote left rib fracture is noted. IMPRESSION: Right lower lobe airspace disease most consistent with pneumonia. Recommend followup to clearing. Electronically Signed   By: Drusilla Kanner M.D.   On: 12/13/2016 20:10        Scheduled Meds: . enoxaparin (LOVENOX) injection  40 mg Subcutaneous Q24H  . mouth rinse  15 mL Mouth Rinse BID  . methadone  10 mg Oral Daily  . piperacillin-tazobactam (ZOSYN)  IV  3.375 g Intravenous Q8H  . potassium chloride  20 mEq Oral Q4H  . vancomycin  1,000 mg Intravenous Q12H   Continuous Infusions:   LOS: 2 days    Time spent: 40 minutes    WOODS, Roselind Messier, MD [facility] Pager [phone]   If 7PM-7AM, please contact night-coverage [email or web address] Password [email or web address] 12/15/2016, 6:20 AM

## 2016-12-15 NOTE — Progress Notes (Signed)
[facility] FOR [facility] LAB REPLACEMENT ONLY  The patient does [facility] based on the criteria listed below:   1. Is GFR >/= 40 ml/min? Yes.    Patient's GFR today is >60 2. Is urine output >/= 0.5 ml/kg/hr for the last 6 hours? Yes.   Patient's UOP is 1.8 ml/kg/hr 3. Is BUN < 60 mg/dL? Yes.    Patient's BUN today is 19 4. Abnormal electrolyte(s): k 3.5 5. Ordered repletion with: protocol 6. If a panic level lab has been reported, has the CCM MD in charge been notified? No..   Physician:    Markus DaftWHELAN, Ashmi Blas A 12/15/2016 5:41 AM

## 2016-12-16 DIAGNOSIS — I6350 Cerebral infarction due to unspecified occlusion or stenosis of unspecified cerebral artery: Secondary | ICD-10-CM

## 2016-12-16 DIAGNOSIS — J189 Pneumonia, unspecified organism: Secondary | ICD-10-CM

## 2016-12-16 LAB — COMPREHENSIVE METABOLIC PANEL
ALT: 23 U/L (ref 17–63)
ANION GAP: 9 (ref 5–15)
AST: 20 U/L (ref 15–41)
Albumin: 2.7 g/dL — ABNORMAL LOW (ref 3.5–5.0)
Alkaline Phosphatase: 147 U/L — ABNORMAL HIGH (ref 38–126)
BUN: 16 mg/dL (ref 6–20)
CHLORIDE: 107 mmol/L (ref 101–111)
CO2: 22 mmol/L (ref 22–32)
Calcium: 9 mg/dL (ref 8.9–10.3)
Creatinine, Ser: 1.14 mg/dL (ref 0.61–1.24)
Glucose, Bld: 96 mg/dL (ref 65–99)
POTASSIUM: 4.5 mmol/L (ref 3.5–5.1)
SODIUM: 138 mmol/L (ref 135–145)
Total Bilirubin: 0.4 mg/dL (ref 0.3–1.2)
Total Protein: 6.4 g/dL — ABNORMAL LOW (ref 6.5–8.1)

## 2016-12-16 LAB — CBC WITH DIFFERENTIAL/PLATELET
BASOS ABS: 0.1 10*3/uL (ref 0.0–0.1)
Basophils Relative: 0 %
EOS PCT: 4 %
Eosinophils Absolute: 0.7 10*3/uL (ref 0.0–0.7)
HEMATOCRIT: 29.2 % — AB (ref 39.0–52.0)
HEMOGLOBIN: 9.4 g/dL — AB (ref 13.0–17.0)
LYMPHS PCT: 13 %
Lymphs Abs: 2.7 10*3/uL (ref 0.7–4.0)
MCH: 26.9 pg (ref 26.0–34.0)
MCHC: 32.2 g/dL (ref 30.0–36.0)
MCV: 83.4 fL (ref 78.0–100.0)
Monocytes Absolute: 0.7 10*3/uL (ref 0.1–1.0)
Monocytes Relative: 3 %
NEUTROS ABS: 16.6 10*3/uL — AB (ref 1.7–7.7)
NEUTROS PCT: 80 %
PLATELETS: 241 10*3/uL (ref 150–400)
RBC: 3.5 MIL/uL — AB (ref 4.22–5.81)
RDW: 14.3 % (ref 11.5–15.5)
WBC: 20.7 10*3/uL — AB (ref 4.0–10.5)

## 2016-12-16 LAB — RPR: RPR: NONREACTIVE

## 2016-12-16 LAB — MAGNESIUM: MAGNESIUM: 1.7 mg/dL (ref 1.7–2.4)

## 2016-12-16 MED ORDER — ASPIRIN EC 81 MG PO TBEC
81.0000 mg | DELAYED_RELEASE_TABLET | Freq: Every day | ORAL | Status: DC
  Administered 2016-12-16 – 2016-12-23 (×8): 81 mg via ORAL
  Filled 2016-12-16 (×8): qty 1

## 2016-12-16 MED ORDER — VANCOMYCIN HCL IN DEXTROSE 1-5 GM/200ML-% IV SOLN
1000.0000 mg | Freq: Two times a day (BID) | INTRAVENOUS | Status: DC
  Administered 2016-12-16 – 2016-12-23 (×14): 1000 mg via INTRAVENOUS
  Filled 2016-12-16 (×16): qty 200

## 2016-12-16 NOTE — Progress Notes (Signed)
INFECTIOUS DISEASE PROGRESS NOTE  ID: Jesus Watkins is a 45 y.o. male with  Active Problems:   Septic shock (HCC)   Severe sepsis (HCC)   Heroin withdrawal (HCC)   Heroin abuse   Sepsis due to pneumonia (HCC)   HCAP (healthcare-associated pneumonia)   Acute renal failure (HCC)   Infarction of left temporal lobe (HCC)  Subjective: Without complaints.  Denies pain.  Vision normal  Abtx:  Anti-infectives    Start     Dose/Rate Route Frequency Ordered Stop   12/16/16 1000  vancomycin (VANCOCIN) IVPB 1000 mg/200 mL premix     1,000 mg 200 mL/hr over 60 Minutes Intravenous Every 12 hours 12/16/16 0930     12/14/16 0800  vancomycin (VANCOCIN) IVPB 1000 mg/200 mL premix  Status:  Discontinued     1,000 mg 200 mL/hr over 60 Minutes Intravenous Every 12 hours 12/13/16 2019 12/16/16 0930   12/14/16 0200  piperacillin-tazobactam (ZOSYN) IVPB 3.375 g     3.375 g 12.5 mL/hr over 240 Minutes Intravenous Every 8 hours 12/13/16 2019     12/13/16 2015  piperacillin-tazobactam (ZOSYN) IVPB 3.375 g     3.375 g 100 mL/hr over 30 Minutes Intravenous  Once 12/13/16 2007 12/13/16 2136   12/13/16 2015  vancomycin (VANCOCIN) IVPB 1000 mg/200 mL premix     1,000 mg 200 mL/hr over 60 Minutes Intravenous  Once 12/13/16 2007 12/13/16 2229      Medications:  Scheduled: . cloNIDine  0.2 mg Transdermal Weekly  . enoxaparin (LOVENOX) injection  40 mg Subcutaneous Q24H  . mouth rinse  15 mL Mouth Rinse BID  . methadone  5 mg Oral Q8H  . piperacillin-tazobactam (ZOSYN)  IV  3.375 g Intravenous Q8H  . vancomycin  1,000 mg Intravenous Q12H    Objective: Vital signs in last 24 hours: Temp:  [97.9 F (36.6 C)-98.9 F (37.2 C)] 97.9 F (36.6 C) (12/26 0803) Pulse Rate:  [59-88] 59 (12/26 0803) Resp:  [18-20] 18 (12/26 0803) BP: (85-147)/(53-90) 85/53 (12/26 0803) SpO2:  [95 %-99 %] 99 % (12/26 0803) Weight:  [69.3 kg (152 lb 12.5 oz)] 69.3 kg (152 lb 12.5 oz) (12/25 2048)   General  appearance: alert, cooperative and no distress Resp: clear to auscultation bilaterally Cardio: regular rate and rhythm GI: normal findings: bowel sounds normal and soft, non-tender  Lab Results  Recent Labs  12/15/16 0348 12/16/16 0536  WBC 27.4* 20.7*  HGB 9.0* 9.4*  HCT 28.1* 29.2*  NA 139 138  K 3.5 4.5  CL 106 107  CO2 24 22  BUN 19 16  CREATININE 1.21 1.14   Liver Panel  Recent Labs  12/13/16 1716 12/16/16 0536  PROT 6.6 6.4*  ALBUMIN 3.2* 2.7*  AST 120* 20  ALT 39 23  ALKPHOS 235* 147*  BILITOT 0.8 0.4   Sedimentation Rate No results for input(s): ESRSEDRATE in the last 72 hours. C-Reactive Protein No results for input(s): CRP in the last 72 hours.  Microbiology: Recent Results (from the past 240 hour(s))  Blood culture (routine x 2)     Status: None (Preliminary result)   Collection Time: 12/13/16  7:20 PM  Result Value Ref Range Status   Specimen Description BLOOD RIGHT UPPER ARM  Final   Special Requests BOTTLES DRAWN AEROBIC AND ANAEROBIC 5CC EA  Final   Culture NO GROWTH 3 DAYS  Final   Report Status PENDING  Incomplete  Blood culture (routine x 2)     Status: None (Preliminary  result)   Collection Time: 12/13/16  7:30 PM  Result Value Ref Range Status   Specimen Description BLOOD LEFT HAND  Final   Special Requests BOTTLES DRAWN AEROBIC AND ANAEROBIC 5CC EA  Final   Culture NO GROWTH 3 DAYS  Final   Report Status PENDING  Incomplete  Urine culture     Status: None   Collection Time: 12/13/16 11:10 PM  Result Value Ref Range Status   Specimen Description URINE, CATHETERIZED  Final   Special Requests NONE  Final   Culture NO GROWTH  Final   Report Status 12/15/2016 FINAL  Final  MRSA PCR Screening     Status: None   Collection Time: 12/14/16 12:53 AM  Result Value Ref Range Status   MRSA by PCR NEGATIVE NEGATIVE Final    Comment:        The GeneXpert MRSA Assay (FDA approved for NASAL specimens only), is one component of a comprehensive  MRSA colonization surveillance program. It is not intended to diagnose MRSA infection nor to guide or monitor treatment for MRSA infections.     Studies/Results: No results found.   Assessment/Plan: IVDA Embolic CVA ARF RLL PNA (? Aspiration)  His bcx are ngtd No change in anbx HIV (-) Await Hepatitis studies Await TEE  Total days of antibiotics: 2 vanco/zosyn         Johny SaxJeffrey Rozell [facility] (pager) [phone] [email or web address] 12/16/2016, 12:25 PM  LOS: 3 days

## 2016-12-16 NOTE — Progress Notes (Signed)
PROGRESS NOTE    Jesus Watkins  WUJ:[id number] DOB: 10-13-1971 DOA: 12/13/2016 PCP: No PCP Per Patient   Brief Narrative:  45 y.o. WM PMHx Heroin Abuse, bilateral Compartment Syndrome of Arms, Hyperglycemia  Presented to the [facility] ED with vision loss. 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 After discharge, he has been living in a hotel nearby in [place] and continued to use heroin. Earlier on 12/23, he took a hit of heroin and started having "[place] fevers" and nausea. He went to bed and woke up with vision loss bilaterally - he was only able to make out dark and light brightness but no visual acuity. His friend drove him to the [facility] ED as his symptoms gradually resolved completely. In the ED, he had a CT head which was negative for acute pathology followed up by a CTA Head and Neck showing vessel aberrancy consistent with IV drug use. He was noted to have a leukocytosis, lactic acidosis and oliguira in the setting of SBPs in the 80s-90s. He was given 4 liters of normal saline and had an improvement in his lactic acidosis from 5 to 2.4. At the time of interview, he was diaphoretic and complaining of being cold. His vision was subjectively back to normal.    Subjective: 12/26  A/O 4, positive N/V, positive diaphoresis, positive tachycardic, positive tachypnea. Withdrawal SSx improved .      Assessment & Plan:   Active Problems:   Septic shock (HCC)   Severe sepsis (HCC)   Heroin withdrawal (HCC)   Heroin abuse   Sepsis due to pneumonia (HCC)   HCAP (healthcare-associated pneumonia)   Acute renal failure (HCC)   Infarction of left temporal lobe (HCC)   Heroin abuse/Heroin withdrawal -evaluate and recorded in Epic Clinical Opioid Withdrawal Scale (COWS ) q 2hr -Methadone 5 mg TID -Clonidine patch 0.2 mg -Zofran  Sepsis Pneumonia(HCAP) vs Endocarditis vs Bacteremia -Continue current  antibiotics -Blood culture, sputum pending  -Troponins: Negative  -ID consulted: Recommend continuing current antibiotics  Endocarditis --Echocardiogram: Shows possible endocarditis see echocardiogram below. -Have ordered a TEE: Contact card mastering the A.m. to officially schedule  Hypotension, -Most likely multifactorial to include severe sepsis, drug overdose -Resolved  Acute renal failure Lab Results  Component Value Date   CREATININE 1.14 12/16/2016   CREATININE 1.21 12/15/2016   CREATININE 1.66 (H) 12/14/2016  -Resolved   Acute Embolic Infarct Left Temporal Lobe   -Acute vision loss- resolved.  -Septic emboli or vasospasm remain concerns with IV drug use.  -CTA Head and Neck suggest vasculopathy.  -Per note Neurology consulted however no note in chart. Addendum spoke with Dr.Lindzen Neurology who will see patient.  -Will need ophthalmology consult as an outpatient    DVT prophylaxis: Lovenox Code Status: Full Family Communication: None Disposition Plan: ??   Consultants:  PCCM Neurology   Procedures/Significant Events:  12/23 CT Angiogram Head/Neck: Negative for acute infarct  -Slightly irregular appearance of the carotid arterymay represent changes of inflammation from drug abuse or atherosclerotic changes  -Prominent dental disease. -Prominent palatine tonsils may indicate inflammation without discrete mass.  12/24 MRI Brainw/wo contrast: Punctate acute embolic infarct in the left temporal lobe. 2. Questionable additional tiny embolic infarcts versus artifact in the right temporal and right parietal lobes 12/25 Echocardiogram:- Left ventricle: moderate LVH. 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     VENTILATOR SETTINGS:    Cultures 12/23 blood 2 NGTD 12/23 urine pending 12/24 MRSA  by PCR negative 12/25 RPR negative 12/25 HIV negative 12/26 acute hepatitis panel pending    Antimicrobials: Anti-infectives    Start     Stop   12/14/16 0800  vancomycin (VANCOCIN) IVPB 1000 mg/200 mL premix         12/14/16 0200  piperacillin-tazobactam (ZOSYN) IVPB 3.375 g         12/13/16 2015  piperacillin-tazobactam (ZOSYN) IVPB 3.375 g     12/13/16 2136   12/13/16 2015  vancomycin (VANCOCIN) IVPB 1000 mg/200 mL premix     12/13/16 2229       Devices    LINES / TUBES:      Continuous Infusions:   Objective: Vitals:   12/15/16 1800 12/15/16 2048 12/16/16 0530 12/16/16 0803  BP: 125/81 112/71 119/85 (!) 85/53  Pulse: 75 88 86 (!) 59  Resp:  19 20 18   Temp:  98.9 F (37.2 C) 98.6 F (37 C) 97.9 F (36.6 C)  TempSrc:  Oral Oral Oral  SpO2:  98% 99% 99%  Weight:  69.3 kg (152 lb 12.5 oz)    Height:        Intake/Output Summary (Last 24 hours) at 12/16/16 1644 Last data filed at 12/16/16 1500  Gross per 24 hour  Intake             1130 ml  Output             3501 ml  Net            -2371 ml   Filed Weights   12/14/16 0103 12/15/16 0500 12/15/16 2048  Weight: 72 kg (158 lb 11.7 oz) 69.4 kg (153 lb) 69.3 kg (152 lb 12.5 oz)    Examination:  General: A/O 4, FNAD. No acute respiratory distress Eyes: negative scleral hemorrhage, negative anisocoria, negative icterus ENT: Extremely poor dentation.  Neck:  Negative scars, masses, torticollis, lymphadenopathy, JVD Lungs: tachypneic, Clear to auscultation bilaterally without wheezes or crackles Cardiovascular: Regular rhythm and rate without murmur gallop or rub normal S1 and S2 Abdomen: negative abdominal pain, nondistended, positive soft, bowel sounds, no rebound, no ascites, no appreciable mass Extremities: No significant cyanosis, clubbing, or edema bilateral lower extremities Skin: Negative rashes, lesions, ulcers Psychiatric:  Negative depression, negative anxiety, negative fatigue, negative mania   Central nervous system:  Cranial nerves II through XII intact, tongue/uvula midline, all extremities muscle strength 5/5, sensation intact throughout, negative dysarthria, negative expressive aphasia, negative receptive aphasia.  .     Data Reviewed: Care during the described time interval was provided by me .  I have reviewed this patient's available data, including medical history, events of note, physical examination, and all test results as part of my evaluation. I have personally reviewed and interpreted all radiology studies.  CBC:  Recent Labs Lab 12/13/16 1716 12/13/16 1735 12/14/16 0528 12/15/16 0348 12/16/16 0536  WBC 14.7*  --  28.4* 27.4* 20.7*  NEUTROABS 14.3*  --   --   --  16.6*  HGB 11.1* 11.2* 9.8* 9.0* 9.4*  HCT 34.9* 33.0* 30.8* 28.1* 29.2*  MCV 85.7  --  83.9 84.6 83.4  PLT 228  --  170 185 241   Basic Metabolic Panel:  Recent Labs Lab 12/13/16 1716 12/13/16 1735 12/14/16 0528 12/15/16 0348 12/16/16 0536  NA 135 135 137 139 138  K 4.1 4.1 4.1 3.5 4.5  CL 99* 99* 106 106 107  CO2 22  --  20* 24 22  GLUCOSE 158* 161* 145* 88 96  BUN 21* 22* 23* 19 16  CREATININE 1.74* 1.40* 1.66* 1.21 1.14  CALCIUM 8.9  --  7.4* 8.1* 9.0  MG  --   --  1.4*  --  1.7  PHOS  --   --  4.0  --   --    GFR: Estimated Creatinine Clearance: 80 mL/min (by C-G formula based on SCr of 1.14 mg/dL). Liver Function Tests:  Recent Labs Lab 12/13/16 1716 12/16/16 0536  AST 120* 20  ALT 39 23  ALKPHOS 235* 147*  BILITOT 0.8 0.4  PROT 6.6 6.4*  ALBUMIN 3.2* 2.7*   No results for input(s): LIPASE, AMYLASE in the last 168 hours. No results for input(s): AMMONIA in the last 168 hours. Coagulation Profile:  Recent Labs Lab 12/13/16 1716  INR 1.25   Cardiac Enzymes:  Recent Labs Lab 12/15/16 1052 12/15/16 1630 12/15/16 2212  TROPONINI <0.03 <0.03 <0.03   BNP (last 3 results) No results for input(s): PROBNP in the last 8760 hours. HbA1C: No results for  input(s): HGBA1C in the last 72 hours. CBG: No results for input(s): GLUCAP in the last 168 hours. Lipid Profile: No results for input(s): CHOL, HDL, LDLCALC, TRIG, CHOLHDL, LDLDIRECT in the last 72 hours. Thyroid Function Tests: No results for input(s): TSH, T4TOTAL, FREET4, T3FREE, THYROIDAB in the last 72 hours. Anemia Panel: No results for input(s): VITAMINB12, FOLATE, FERRITIN, TIBC, IRON, RETICCTPCT in the last 72 hours. Urine analysis:    Component Value Date/Time   COLORURINE YELLOW 12/13/2016 2310   APPEARANCEUR HAZY (A) 12/13/2016 2310   LABSPEC 1.034 (H) 12/13/2016 2310   PHURINE 5.0 12/13/2016 2310   GLUCOSEU NEGATIVE 12/13/2016 2310   HGBUR SMALL (A) 12/13/2016 2310   BILIRUBINUR NEGATIVE 12/13/2016 2310   KETONESUR NEGATIVE 12/13/2016 2310   PROTEINUR 100 (A) 12/13/2016 2310   NITRITE NEGATIVE 12/13/2016 2310   LEUKOCYTESUR NEGATIVE 12/13/2016 2310   Sepsis Labs: @LABRCNTIP (procalcitonin:4,lacticidven:4)  ) Recent Results (from the past 240 hour(s))  Blood culture (routine x 2)     Status: None (Preliminary result)   Collection Time: 12/13/16  7:20 PM  Result Value Ref Range Status   Specimen Description BLOOD RIGHT UPPER ARM  Final   Special Requests BOTTLES DRAWN AEROBIC AND ANAEROBIC 5CC EA  Final   Culture NO GROWTH 3 DAYS  Final   Report Status PENDING  Incomplete  Blood culture (routine x 2)     Status: None (Preliminary result)   Collection Time: 12/13/16  7:30 PM  Result Value Ref Range Status   Specimen Description BLOOD LEFT HAND  Final   Special Requests BOTTLES DRAWN AEROBIC AND ANAEROBIC 5CC EA  Final   Culture NO GROWTH 3 DAYS  Final   Report Status PENDING  Incomplete  Urine culture     Status: None   Collection Time: 12/13/16 11:10 PM  Result Value Ref Range Status   Specimen Description URINE, CATHETERIZED  Final   Special Requests NONE  Final   Culture NO GROWTH  Final   Report Status 12/15/2016 FINAL  Final  MRSA PCR Screening      Status: None   Collection Time: 12/14/16 12:53 AM  Result Value Ref Range Status   MRSA by PCR NEGATIVE NEGATIVE Final    Comment:        The GeneXpert MRSA Assay (FDA  approved for NASAL specimens only), is one component of a comprehensive MRSA colonization surveillance program. It is not intended to diagnose MRSA infection nor to guide or monitor treatment for MRSA infections.          Radiology Studies: No results found.      Scheduled Meds: . aspirin EC  81 mg Oral Daily  . cloNIDine  0.2 mg Transdermal Weekly  . enoxaparin (LOVENOX) injection  40 mg Subcutaneous Q24H  . mouth rinse  15 mL Mouth Rinse BID  . methadone  5 mg Oral Q8H  . piperacillin-tazobactam (ZOSYN)  IV  3.375 g Intravenous Q8H  . vancomycin  1,000 mg Intravenous Q12H   Continuous Infusions:   LOS: 3 days    Time spent: 40 minutes    Ellison Leisure, Roselind MessierURTIS J, MD [facility] Pager [phone]   If 7PM-7AM, please contact night-coverage [email or web address] Password [email or web address] 12/16/2016, 4:44 PM

## 2016-12-16 NOTE — Progress Notes (Signed)
STROKE TEAM PROGRESS NOTE   HISTORY OF PRESENT ILLNESS (per record) Jesus Watkins is a 45 y.o. male who presented with vision loss after injecting heroin. Per chart review: "Earlier on 12/23, he took a hit of heroin and started having "[place] fevers" and nausea. He went to bed and woke up with vision loss bilaterally - he was only able to make out dark and light brightness but no visual acuity. His friend drove him to the [facility] ED as his symptoms gradually resolved completely. 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 Since discharge, he has been living in a hotel and has continued to use heroin. This admission, labs showed leukocytosis, lactic acidosis and oliguria. He has been hypotensive, with SBPs in the 80s-90s.   His vision is subjectively back to normal. He currently is experiencing heroin withdrawal symptoms.   Patient was not administered IV t-PA secondary to delay in arrival. He was admitted for further evaluation and treatment.   SUBJECTIVE (INTERVAL HISTORY) The patient is lying comfortably in Bed. He denies any neurological complaints.   OBJECTIVE Temp:  [97.9 F (36.6 C)-98.9 F (37.2 C)] 97.9 F (36.6 C) (12/26 0803) Pulse Rate:  [59-88] 59 (12/26 0803) Cardiac Rhythm: Normal sinus rhythm (12/26 0900) Resp:  [18-20] 18 (12/26 0803) BP: (85-133)/(53-85) 85/53 (12/26 0803) SpO2:  [95 %-99 %] 99 % (12/26 0803) Weight:  [69.3 kg (152 lb 12.5 oz)] 69.3 kg (152 lb 12.5 oz) (12/25 2048)  CBC:  Recent Labs Lab  12/13/16 1716  12/15/16 0348 12/16/16 0536  WBC 14.7*  < > 27.4* 20.7*  NEUTROABS 14.3*  --   --  16.6*  HGB 11.1*  < > 9.0* 9.4*  HCT 34.9*  < > 28.1* 29.2*  MCV 85.7  < > 84.6 83.4  PLT 228  < > 185 241  < > = values in this interval not displayed.  Basic Metabolic Panel:  Recent Labs Lab 12/14/16 0528 12/15/16 0348 12/16/16 0536  NA 137 139 138  K 4.1 3.5 4.5  CL 106 106 107  CO2 20* 24 22  GLUCOSE 145* 88 96  BUN 23* 19 16  CREATININE 1.66* 1.21 1.14  CALCIUM 7.4* 8.1* 9.0  MG 1.4*  --  1.7  PHOS 4.0  --   --     Lipid Panel:    Component Value Date/Time   CHOL 89 11/06/2016 0445   TRIG 76 11/06/2016 0445   HDL 24 (L) 11/06/2016 0445   CHOLHDL 3.7 11/06/2016 0445   VLDL 15 11/06/2016 0445   LDLCALC 50 11/06/2016 0445   HgbA1c:  Lab Results  Component Value Date   HGBA1C 6.0 (H) 11/09/2016   Urine Drug Screen:    Component Value Date/Time   LABOPIA POSITIVE (A) 11/05/2016 1810   COCAINSCRNUR NONE DETECTED 11/05/2016 1810   LABBENZ POSITIVE (A) 11/05/2016 1810   AMPHETMU POSITIVE (A) 11/05/2016 1810   THCU NONE DETECTED 11/05/2016 1810   LABBARB NONE DETECTED 11/05/2016 1810      IMAGING  Ct Angio Head  W Or Wo Contrast  Result Date: 12/13/2016 CLINICAL DATA:  45 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 Dizziness. Injected herion this morning. Subsequent encounter. EXAM: CT ANGIOGRAPHY HEAD AND NECK TECHNIQUE: Multidetector CT imaging of the head and neck was performed using the standard protocol during bolus administration of intravenous contrast. Multiplanar CT image reconstructions and MIPs were obtained to evaluate the vascular anatomy. Carotid stenosis measurements (when applicable) are obtained utilizing NASCET criteria, using the distal internal carotid diameter as the denominator. CONTRAST:  75 cc Isovue 370. COMPARISON:  12/13/2016 head CT. FINDINGS: CT HEAD FINDINGS Brain: No  intracranial hemorrhage or CT evidence of large acute infarct. No intracranial mass or enhancement.  No hydrocephalus. Vascular: As below Skull: No acute abnormality. Sinuses: Mucosal thickening left sphenoid sinus, maxillary sinuses bilaterally and opacification ethmoid sinus air cells bilaterally. Orbits: No acute abnormality. Review of the MIP images confirms the above findings CTA NECK FINDINGS Aortic arch: 3 vessel arch with mild plaque. Right carotid system: Slightly irregular appearance of the right carotid artery throughout its course with mild narrowing which may represent changes of inflammation from drug abuse or atherosclerotic changes. No evidence of dissection or high-grade stenosis. Left carotid system: Slightly irregular appearance of the left carotid artery throughout its course with mild narrowing which may represent changes of inflammation from drug abuse or atherosclerotic change. No evidence of dissection or high-grade stenosis. Vertebral arteries: Left vertebral artery is dominant. No high-grade stenosis of either vertebral artery. Skeleton: Prominent dental disease. Cervical spondylotic changes most notable C6-7. Other neck: Prominent palatine tonsils may indicate inflammation without discrete mass identified. Direct visualization would be necessary to exclude subtle mucosal abnormality. Scattered increased number of small lymph nodes possibly reactive in origin. Upper chest: No lung apical lesion. Review of the MIP images confirms the above findings CTA HEAD FINDINGS Anterior circulation: Mild irregularity of medium and large size vessels of the anterior circulation without high-grade stenosis. Mild to moderate branch vessel irregularity. Posterior circulation: Moderate narrowing distal right vertebral artery. Mild irregularity distal left vertebral artery. Mild to moderate narrowing portions of the basilar artery. Posterior cerebral artery mild to moderate branch vessel irregularity and  narrowing. Venous sinuses: Patent. Anatomic variants: Fetal contribution to the right posterior cerebral artery. Delayed phase: As above. Review of the MIP images confirms the above findings IMPRESSION: CT HEAD No intracranial hemorrhage or CT evidence of large acute infarct. No intracranial mass or enhancement. Mucosal thickening left sphenoid sinus, maxillary sinuses bilaterally and opacification ethmoid sinus air cells bilaterally. CTA NECK Slightly irregular appearance of the carotid artery throughout its course bilaterally with mild narrowing which may represent changes of inflammation from drug abuse or atherosclerotic changes. No evidence of dissection or high-grade stenosis. Left vertebral artery is dominant. No high-grade stenosis of either vertebral artery. Prominent dental disease. Prominent palatine tonsils may indicate inflammation without discrete mass. Direct visualization would be necessary to exclude mucosa abnormality. Scattered increased number of small lymph nodes possibly reactive in origin. CTA HEAD Mild irregularity of medium and large size vessels of the anterior circulation without high-grade stenosis. Mild to mild branch vessel irregularity. Moderate narrowing distal right vertebral artery. Mild irregularity distal left vertebral artery. Mild to moderate narrowing portions of the basilar artery. Posterior cerebral artery mild to moderate branch vessel irregularity and narrowing. Findings may indicate result of vasculopathy related to patient's drug use or secondary to underlying atherosclerotic changes. Electronically Signed   By: Viviann SpareSteven  Constance Goltz M.D.   On: 12/13/2016 22:16   Ct Head Wo Contrast  Result Date: 12/13/2016 CLINICAL DATA:  Pt woke up about 2 hrs ago and was blind had a headache He is able to see images now EXAM: CT HEAD WITHOUT CONTRAST TECHNIQUE: Contiguous axial images were obtained from the base of the skull through the vertex without intravenous contrast. COMPARISON:   None. FINDINGS: Brain: No mass lesion, hemorrhage, hydrocephalus, acute infarct, intra-axial, or extra-axial fluid collection. Vascular: No hyperdense vessel or unexpected calcification. Skull: Normal Sinuses/Orbits: Normal orbits and globes. Mucosal thickening of ethmoid air cells and left sphenoid sinus. Clear mastoid air cells. Other:  None IMPRESSION: 1.  No acute intracranial abnormality. 2. Sinus disease. Electronically Signed   By: Jeronimo Greaves M.D.   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 Dizziness. Injected herion this morning. Subsequent encounter. EXAM: CT ANGIOGRAPHY HEAD AND NECK TECHNIQUE: Multidetector CT imaging of the head and neck was performed using the standard protocol during bolus administration of intravenous contrast. Multiplanar CT image reconstructions and MIPs were obtained to evaluate the vascular anatomy. Carotid stenosis measurements (when applicable) are obtained utilizing NASCET criteria, using the distal internal carotid diameter as the denominator. CONTRAST:  75 cc Isovue 370. COMPARISON:  12/13/2016 head CT. FINDINGS: CT HEAD FINDINGS Brain: No intracranial hemorrhage or CT evidence of large acute infarct. No intracranial mass or enhancement.  No hydrocephalus. Vascular: As below Skull: No acute abnormality. Sinuses: Mucosal thickening left sphenoid sinus, maxillary sinuses bilaterally and opacification ethmoid sinus air cells bilaterally. Orbits: No acute abnormality. Review of the MIP images confirms the above findings CTA NECK FINDINGS Aortic arch: 3 vessel arch with mild plaque. Right carotid system: Slightly irregular appearance of the right carotid artery throughout its course with mild narrowing which may represent changes of inflammation from drug abuse or atherosclerotic changes. No evidence of  dissection or high-grade stenosis. Left carotid system: Slightly irregular appearance of the left carotid artery throughout its course with mild narrowing which may represent changes of inflammation from drug abuse or atherosclerotic change. No evidence of dissection or high-grade stenosis. Vertebral arteries: Left vertebral artery is dominant. No high-grade stenosis of either vertebral artery. Skeleton: Prominent dental disease. Cervical spondylotic changes most notable C6-7. Other neck: Prominent palatine tonsils may indicate inflammation without discrete mass identified. Direct visualization would be necessary to exclude subtle mucosal abnormality. Scattered increased number of small lymph nodes possibly reactive in origin. Upper chest: No lung apical lesion. Review of the MIP images confirms the above findings CTA HEAD FINDINGS Anterior circulation: Mild irregularity of medium and large size vessels of the anterior circulation without high-grade stenosis. Mild to moderate branch vessel irregularity. Posterior circulation: Moderate narrowing distal right vertebral artery. Mild irregularity distal left vertebral artery. Mild to moderate narrowing portions of the basilar artery. Posterior cerebral artery mild to moderate branch vessel irregularity and narrowing. Venous sinuses: Patent. Anatomic variants: Fetal contribution to the right posterior cerebral artery. Delayed phase: As above. Review of the MIP images confirms the above findings IMPRESSION: CT HEAD No intracranial hemorrhage or CT evidence of large acute infarct. No intracranial mass or enhancement. Mucosal thickening left sphenoid sinus, maxillary sinuses bilaterally and opacification ethmoid sinus air cells bilaterally. CTA NECK Slightly irregular appearance of the carotid artery throughout its course bilaterally with mild narrowing which  may represent changes of inflammation from drug abuse or atherosclerotic changes. No evidence of dissection or  high-grade stenosis. Left vertebral artery is dominant. No high-grade stenosis of either vertebral artery. Prominent dental disease. Prominent palatine tonsils may indicate inflammation without discrete mass. Direct visualization would be necessary to exclude mucosa abnormality. Scattered increased number of small lymph nodes possibly reactive in origin. CTA HEAD Mild irregularity of medium and large size vessels of the anterior circulation without high-grade stenosis. Mild to mild branch vessel irregularity. Moderate narrowing distal right vertebral artery. Mild irregularity distal left vertebral artery. Mild to moderate narrowing portions of the basilar artery. Posterior cerebral artery mild to moderate branch vessel irregularity and narrowing. Findings may indicate result of vasculopathy related to patient's drug use or secondary to underlying atherosclerotic changes. Electronically Signed   By: Lacy Duverney M.D.   On: 12/13/2016 22:16   Mr Laqueta Jean And Wo Contrast  Result Date: 12/14/2016 CLINICAL DATA:  Transient vision loss. Headache. Fever. Hairline abuse. EXAM: MRI HEAD WITHOUT AND WITH CONTRAST TECHNIQUE: Multiplanar, multiecho pulse sequences of the brain and surrounding structures were obtained without and with intravenous contrast. CONTRAST:  14mL MULTIHANCE GADOBENATE DIMEGLUMINE 529 MG/ML IV SOLN COMPARISON:  Head CT/ CTA 12/13/2016 FINDINGS: The study is mildly motion degraded. Brain: There is a 2 mm punctate focus of restricted diffusion involving cortex or subcortical white matter in the posterior left temporal lobe (series 3, image 23). Similar punctate foci of restricted diffusion are questioned in the right temporal lobe (series 3, image 23) and right parietal lobe (series 9, image 7). There is no evidence of intracranial hemorrhage, mass, midline shift, or extra-axial fluid collection. The ventricles and sulci are normal. No significant chronic ischemic changes are identified within  limitations of motion artifact. No abnormal enhancement is identified. Vascular: Major intracranial vascular flow voids are preserved. Skull and upper cervical spine: Unremarkable bone marrow signal. Sinuses/Orbits: Unremarkable orbits. Mild mucosal thickening throughout the paranasal sinuses. Small right mastoid effusion. Other: None. IMPRESSION: 1. 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 2. Questionable additional tiny embolic infarcts versus artifact in the right temporal and right parietal lobes. Electronically Signed   By: Sebastian Ache M.D.   On: 12/14/2016 08:18   Dg Chest Portable 1 View  Result Date: 12/13/2016 CLINICAL DATA:  Shortness of breath and productive cough for 2 weeks, worsening. EXAM: PORTABLE CHEST 1 VIEW COMPARISON:  PA and lateral chest 11/04/2016. FINDINGS: There is new right lower lobe airspace disease. Left lung is clear. Heart size is normal. No pneumothorax or pleural effusion. Remote left rib fracture is noted. IMPRESSION: Right lower lobe airspace disease most consistent with pneumonia. Recommend followup to clearing. Electronically Signed   By: Drusilla Kanner M.D.   On: 12/13/2016 20:10   2D echocardiogram - Left ventricle: The cavity size was normal. Wall thickness was increased in a pattern of moderate LVH. Systolic function was normal. The estimated ejection fraction was in the range of 55% to 60%. Wall motion was normal; there were no regional wall motion abnormalities. Doppler parameters are consistent with pseudonormal left ventricular relaxation (grade 1 diastolic dysfunction). The E/e&' ratio is around 15, suggesting borderline elevated LV filling pressure. - Mitral valve: Mildly thickened leaflets . There was mild regurgitation. - Left atrium: The atrium was normal in size. - Right ventricle: The cavity size was normal. Wall thickness was normal. Systolic function was normal. - Right atrium: The atrium was normal in size. Mobile filamentous  structure at the  IVC/RA junction - suspicious for promient chiari network (embryologic variant), however, cannot r/o right-sided endocarditis. - Atrial septum: No defect or patent foramen ovale was identified. Impressions: - LVEF 55-60%, moderate LVH, normal wall motion, diastolic dysfunction, indeterminate LV filling pressure, mild MR, normal LA size, there is a filamentous structure at the RA/IVC junction which is suspicious for a chiari network (embryologic variant), however, I do not have a prior echo to compare to. I cannot exclude that this structure may be a vegetation in the setting of bacteremia. Consider TEE for further evaluation - which may be helpful if vegetations can be seen on the tricuspid and/or pulmonic valve as well.  PHYSICAL EXAM Frail cachectic middle-age Caucasian male  not in any distress. . Afebrile. Head is nontraumatic. Neck is supple without bruit.    Cardiac exam no murmur or gallop. Lungs are clear to auscultation. Distal pulses are well felt. Neurological Exam ;  Awake  Alert oriented x 3. Normal speech and language.eye movements full without nystagmus.fundi were not visualized. Vision acuity and fields appear normal. Hearing is normal. Palatal movements are normal. Face symmetric. Tongue midline. Normal strength, tone, reflexes and coordination. Normal sensation. Gait deferred.  ASSESSMENT/PLAN Mr. Jesus Watkins is a 45 y.o. male with history of IVDU with heroin presenting with bilateral vision loss. He did not receive IV t-PA due to delay in arrival.   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 Prominent detnal disease. Prominent palatine tonsils without discrete  mass. Increased scattered small LNs  2D Echo  EF 55-60%, normal LVH. Filamentous structure at RA/IVC jxn - either chiari network vs vegetation in setting of bacteremia  LDL ordered  TEE pending to rule out endocarditis  HgbA1c 6.0 in Nov  Lovenox 40 mg sq daily for VTE prophylaxis  Diet regular Room service appropriate? Yes; Fluid consistency: Thin  No antithrombotic prior to admission, now on No antithrombotic add low dose aspirin for secondary stroke prevention per guidelines  Patient counseled to be compliant with his antithrombotic medications  Ongoing aggressive stroke risk factor management  Therapy recommendations:  pending   Disposition:  pending   Hypotension Resolved  Other Stroke Risk Factors  Heroin use  Other IV drug use  Cigarette smoker, advised to stop smoking  Other Active Problems  ARF, resolved  RLL PNA, ? Aspiration vs sepsis vs endocarditis - ID following  Recent compartment syndrome d/t drug use  Hospital day # 3  I have personally examined this patient, reviewed notes, independently viewed imaging studies, participated in medical decision making and plan of care.ROS completed by me personally and pertinent positives fully documented  I have made any additions or clarifications directly to the above note. He presented with transient bilateral vision loss an MRI shows multiple tiny punctate posterior circulation infarcts likely of embolic etiology either from cocaine related arrhythmias , endocarditis or vasospsam. Patient was counseled to quit cocaine and smoking and be compliant with his medications and practice healthy lifestyle and aggressive risk factor modification. Start aspirin for stroke prevention.a weight he for endocarditis  given   history of cellulitis and iv drug abuse.greater than 50% time during this 35 minute visit was spent on counseling and coordination of care  about his embolic strokes.  Delia Heady, MD Medical Director [facility] Pager: [phone] 12/16/2016 5:55 PM   To contact Stroke Continuity provider, please refer to [email or web address]. After hours, contact General Neurology

## 2016-12-17 DIAGNOSIS — I6340 Cerebral infarction due to embolism of unspecified cerebral artery: Secondary | ICD-10-CM

## 2016-12-17 LAB — BLOOD CULTURE ID PANEL (REFLEXED)
Acinetobacter baumannii: NOT DETECTED
CANDIDA GLABRATA: NOT DETECTED
CANDIDA KRUSEI: NOT DETECTED
CANDIDA TROPICALIS: NOT DETECTED
Candida albicans: NOT DETECTED
Candida parapsilosis: NOT DETECTED
ENTEROBACTER CLOACAE COMPLEX: NOT DETECTED
ESCHERICHIA COLI: NOT DETECTED
Enterobacteriaceae species: NOT DETECTED
Enterococcus species: NOT DETECTED
Haemophilus influenzae: NOT DETECTED
KLEBSIELLA PNEUMONIAE: NOT DETECTED
Klebsiella oxytoca: NOT DETECTED
Listeria monocytogenes: NOT DETECTED
NEISSERIA MENINGITIDIS: NOT DETECTED
PROTEUS SPECIES: NOT DETECTED
Pseudomonas aeruginosa: NOT DETECTED
SERRATIA MARCESCENS: NOT DETECTED
STAPHYLOCOCCUS SPECIES: NOT DETECTED
Staphylococcus aureus (BCID): NOT DETECTED
Streptococcus agalactiae: NOT DETECTED
Streptococcus pneumoniae: NOT DETECTED
Streptococcus pyogenes: NOT DETECTED
Streptococcus species: NOT DETECTED

## 2016-12-17 LAB — LIPID PANEL
CHOLESTEROL: 134 mg/dL (ref 0–200)
HDL: 20 mg/dL — AB (ref >40)
LDL Cholesterol: 86 mg/dL (ref 0–99)
TRIGLYCERIDES: 139 mg/dL (ref <150)
Total CHOL/HDL Ratio: 6.7 RATIO
VLDL: 28 mg/dL (ref 0–40)

## 2016-12-17 LAB — HEPATITIS PANEL, ACUTE
HEP A IGM: NEGATIVE
HEP B S AG: NEGATIVE
Hep A IgM: NEGATIVE
Hep B C IgM: NEGATIVE
Hep B C IgM: NEGATIVE
Hepatitis B Surface Ag: NEGATIVE

## 2016-12-17 LAB — VANCOMYCIN, TROUGH: Vancomycin Tr: 17 ug/mL (ref 15–20)

## 2016-12-17 MED ORDER — METHADONE HCL 5 MG PO TABS
5.0000 mg | ORAL_TABLET | Freq: Two times a day (BID) | ORAL | Status: DC
  Administered 2016-12-17 – 2016-12-18 (×2): 5 mg via ORAL
  Filled 2016-12-17 (×2): qty 1

## 2016-12-17 NOTE — Progress Notes (Signed)
INFECTIOUS DISEASE PROGRESS NOTE  ID: Jesus KinScott Watkins is a 45 y.o. male with  Active Problems:   Septic shock (HCC)   Severe sepsis (HCC)   Heroin withdrawal (HCC)   Heroin abuse   Sepsis due to pneumonia (HCC)   HCAP (healthcare-associated pneumonia)   Acute renal failure (HCC)   Infarction of left temporal lobe (HCC)  Subjective: Without complaints.  States he is back to neuro baseline.   Abtx:  Anti-infectives    Start     Dose/Rate Route Frequency Ordered Stop   12/16/16 1000  vancomycin (VANCOCIN) IVPB 1000 mg/200 mL premix     1,000 mg 200 mL/hr over 60 Minutes Intravenous Every 12 hours 12/16/16 0930     12/14/16 0800  vancomycin (VANCOCIN) IVPB 1000 mg/200 mL premix  Status:  Discontinued     1,000 mg 200 mL/hr over 60 Minutes Intravenous Every 12 hours 12/13/16 2019 12/16/16 0930   12/14/16 0200  piperacillin-tazobactam (ZOSYN) IVPB 3.375 g     3.375 g 12.5 mL/hr over 240 Minutes Intravenous Every 8 hours 12/13/16 2019     12/13/16 2015  piperacillin-tazobactam (ZOSYN) IVPB 3.375 g     3.375 g 100 mL/hr over 30 Minutes Intravenous  Once 12/13/16 2007 12/13/16 2136   12/13/16 2015  vancomycin (VANCOCIN) IVPB 1000 mg/200 mL premix     1,000 mg 200 mL/hr over 60 Minutes Intravenous  Once 12/13/16 2007 12/13/16 2229      Medications:  Scheduled: . aspirin EC  81 mg Oral Daily  . cloNIDine  0.2 mg Transdermal Weekly  . enoxaparin (LOVENOX) injection  40 mg Subcutaneous Q24H  . mouth rinse  15 mL Mouth Rinse BID  . methadone  5 mg Oral Q8H  . piperacillin-tazobactam (ZOSYN)  IV  3.375 g Intravenous Q8H  . vancomycin  1,000 mg Intravenous Q12H    Objective: Vital signs in last 24 hours: Temp:  [97.9 F (36.6 C)-98.6 F (37 C)] 98.4 F (36.9 C) (12/27 1018) Pulse Rate:  [54-73] 55 (12/27 1018) Resp:  [16-19] 16 (12/27 1018) BP: (103-115)/(55-66) 103/56 (12/27 1018) SpO2:  [96 %-99 %] 96 % (12/27 1018) Weight:  [69.5 kg (153 lb 3.5 oz)] 69.5 kg (153 lb 3.5  oz) (12/26 2028)   General appearance: alert, cooperative and no distress Resp: clear to auscultation bilaterally Cardio: regular rate and rhythm GI: normal findings: bowel sounds normal and soft, non-tender  Lab Results  Recent Labs  12/15/16 0348 12/16/16 0536  WBC 27.4* 20.7*  HGB 9.0* 9.4*  HCT 28.1* 29.2*  NA 139 138  K 3.5 4.5  CL 106 107  CO2 24 22  BUN 19 16  CREATININE 1.21 1.14   Liver Panel  Recent Labs  12/16/16 0536  PROT 6.4*  ALBUMIN 2.7*  AST 20  ALT 23  ALKPHOS 147*  BILITOT 0.4   Sedimentation Rate No results for input(s): ESRSEDRATE in the last 72 hours. C-Reactive Protein No results for input(s): CRP in the last 72 hours.  Microbiology: Recent Results (from the past 240 hour(s))  Blood culture (routine x 2)     Status: None (Preliminary result)   Collection Time: 12/13/16  7:20 PM  Result Value Ref Range Status   Specimen Description BLOOD RIGHT UPPER ARM  Final   Special Requests BOTTLES DRAWN AEROBIC AND ANAEROBIC 5CC EA  Final   Culture  Setup Time   Final    GRAM POSITIVE RODS ANAEROBIC BOTTLE ONLY Organism ID to follow CRITICAL RESULT CALLED  TO, READ BACK BY AND VERIFIED WITH: ADagoberto Ligas. Johnston Pharm.D. 15:00 12/17/16 ([email or web address])    Culture NO GROWTH 4 DAYS  Final   Report Status PENDING  Incomplete  Blood Culture ID Panel (Reflexed)     Status: None   Collection Time: 12/13/16  7:20 PM  Result Value Ref Range Status   Enterococcus species NOT DETECTED NOT DETECTED Final   Listeria monocytogenes NOT DETECTED NOT DETECTED Final   Staphylococcus species NOT DETECTED NOT DETECTED Final   Staphylococcus aureus NOT DETECTED NOT DETECTED Final   Streptococcus species NOT DETECTED NOT DETECTED Final   Streptococcus agalactiae NOT DETECTED NOT DETECTED Final   Streptococcus pneumoniae NOT DETECTED NOT DETECTED Final   Streptococcus pyogenes NOT DETECTED NOT DETECTED Final   Acinetobacter baumannii NOT DETECTED NOT DETECTED Final    Enterobacteriaceae species NOT DETECTED NOT DETECTED Final   Enterobacter cloacae complex NOT DETECTED NOT DETECTED Final   Escherichia coli NOT DETECTED NOT DETECTED Final   Klebsiella oxytoca NOT DETECTED NOT DETECTED Final   Klebsiella pneumoniae NOT DETECTED NOT DETECTED Final   Proteus species NOT DETECTED NOT DETECTED Final   Serratia marcescens NOT DETECTED NOT DETECTED Final   Haemophilus influenzae NOT DETECTED NOT DETECTED Final   Neisseria meningitidis NOT DETECTED NOT DETECTED Final   Pseudomonas aeruginosa NOT DETECTED NOT DETECTED Final   Candida albicans NOT DETECTED NOT DETECTED Final   Candida glabrata NOT DETECTED NOT DETECTED Final   Candida krusei NOT DETECTED NOT DETECTED Final   Candida parapsilosis NOT DETECTED NOT DETECTED Final   Candida tropicalis NOT DETECTED NOT DETECTED Final  Blood culture (routine x 2)     Status: None (Preliminary result)   Collection Time: 12/13/16  7:30 PM  Result Value Ref Range Status   Specimen Description BLOOD LEFT HAND  Final   Special Requests BOTTLES DRAWN AEROBIC AND ANAEROBIC 5CC EA  Final   Culture NO GROWTH 4 DAYS  Final   Report Status PENDING  Incomplete  Urine culture     Status: None   Collection Time: 12/13/16 11:10 PM  Result Value Ref Range Status   Specimen Description URINE, CATHETERIZED  Final   Special Requests NONE  Final   Culture NO GROWTH  Final   Report Status 12/15/2016 FINAL  Final  MRSA PCR Screening     Status: None   Collection Time: 12/14/16 12:53 AM  Result Value Ref Range Status   MRSA by PCR NEGATIVE NEGATIVE Final    Comment:        The GeneXpert MRSA Assay (FDA approved for NASAL specimens only), is one component of a comprehensive MRSA colonization surveillance program. It is not intended to diagnose MRSA infection nor to guide or monitor treatment for MRSA infections.     Studies/Results: No results found.   Assessment/Plan: IVDA Embolic CVA ARF RLL PNA (?  Aspiration)  His bcx are ngtd in 1 bottle, G+ rods in 2nd (suspect contaminant?) No change in anbx HIV (-) Hepatitis (-) Await TEE  Total days of antibiotics: 3 vanco/zosyn         Johny SaxJeffrey Jerica [place] [facility] (pager) [phone] [email or web address] 12/17/2016, 4:33 PM  LOS: 4 days

## 2016-12-17 NOTE — Progress Notes (Addendum)
Pharmacy Antibiotic Note  Jesus Watkins is a 45 y.o. male admitted on 12/13/2016 with sepsis 2/2 pneumonia vs IDVA.  Pharmacy has been consulted for vancomycin and Zosyn dosing. Today is day 5 of abx. SCr trending down to 1.14, WBC still elevated at 20.7, afebrile. Embolic CVA noted, TEE pending, cultures negative to date.  12/27 vanc trough therapeutic at 17  Plan: Continue vancomycin 1000mg  IV every 12 hours.  Goal trough 15-20 mcg/mL. Continue Zosyn 3.375g IV q8h (4 hour infusion) Monitor renal function, clinical progression F/u TEE, cultures, abx LOT  Height: 5\' 8"  (172.7 cm) Weight: 153 lb 3.5 oz (69.5 kg) IBW/kg (Calculated) : 68.4  Temp (24hrs), Avg:98.3 F (36.8 C), Min:97.9 F (36.6 C), Max:98.6 F (37 C)   Recent Labs Lab 12/13/16 1716 12/13/16 1735  12/13/16 2115 12/14/16 0528 12/14/16 0826 12/15/16 0348 12/15/16 0700 12/15/16 1000 12/16/16 0536 12/17/16 0847  WBC 14.7*  --   --   --  28.4*  --  27.4*  --   --  20.7*  --   CREATININE 1.74* 1.40*  --   --  1.66*  --  1.21  --   --  1.14  --   LATICACIDVEN  --   --   < > 2.4* 2.8* 2.5*  --  0.7 1.1  --   --   VANCOTROUGH  --   --   --   --   --   --   --   --   --   --  17  < > = values in this interval not displayed.  Estimated Creatinine Clearance: 80 mL/min (by C-G formula based on SCr of 1.14 mg/dL).    No Known Allergies  Antimicrobials this admission: Vanc 12/23 >>  Zosyn 12/23 >>   Dose adjustments this admission: n/a  Microbiology results: 12/23 BCx: NGTD 12/23 UCx: no growth final  Thank you for allowing pharmacy to be a part of this patient's care.   Mackie Paienee Ackley, PharmD PGY1 Pharmacy Resident Pager: [phone] 12/17/2016 10:42 AM

## 2016-12-17 NOTE — Progress Notes (Signed)
PHARMACY - PHYSICIAN COMMUNICATION CRITICAL VALUE ALERT - BLOOD CULTURE IDENTIFICATION (BCID)  Results for orders placed or performed during the hospital encounter of 12/13/16  Blood Culture ID Panel (Reflexed) (Collected: 12/13/2016  7:20 PM)  Result Value Ref Range   Enterococcus species NOT DETECTED NOT DETECTED   Listeria monocytogenes NOT DETECTED NOT DETECTED   Staphylococcus species NOT DETECTED NOT DETECTED   Staphylococcus aureus NOT DETECTED NOT DETECTED   Streptococcus species NOT DETECTED NOT DETECTED   Streptococcus agalactiae NOT DETECTED NOT DETECTED   Streptococcus pneumoniae NOT DETECTED NOT DETECTED   Streptococcus pyogenes NOT DETECTED NOT DETECTED   Acinetobacter baumannii NOT DETECTED NOT DETECTED   Enterobacteriaceae species NOT DETECTED NOT DETECTED   Enterobacter cloacae complex NOT DETECTED NOT DETECTED   Escherichia coli NOT DETECTED NOT DETECTED   Klebsiella oxytoca NOT DETECTED NOT DETECTED   Klebsiella pneumoniae NOT DETECTED NOT DETECTED   Proteus species NOT DETECTED NOT DETECTED   Serratia marcescens NOT DETECTED NOT DETECTED   Haemophilus influenzae NOT DETECTED NOT DETECTED   Neisseria meningitidis NOT DETECTED NOT DETECTED   Pseudomonas aeruginosa NOT DETECTED NOT DETECTED   Candida albicans NOT DETECTED NOT DETECTED   Candida glabrata NOT DETECTED NOT DETECTED   Candida krusei NOT DETECTED NOT DETECTED   Candida parapsilosis NOT DETECTED NOT DETECTED   Candida tropicalis NOT DETECTED NOT DETECTED    Name of physician (or Provider) Contacted: Dr. Ninetta LightsHatcher   Changes to prescribed antibiotics required: none, continue Zosyn and vancomycin and await final culture data.    Pollyann SamplesAndy Arlayne Liggins, PharmD, BCPS 12/17/2016, 3:02 PM Pager: [phone]

## 2016-12-17 NOTE — Progress Notes (Signed)
STROKE TEAM PROGRESS NOTE   HISTORY OF PRESENT ILLNESS (per record) Jesus Watkins is a 45 y.o. male who presented with vision loss after injecting heroin. Per chart review: "Earlier on 12/23, he took a hit of heroin and started having "[place] fevers" and nausea. He went to bed and woke up with vision loss bilaterally - he was only able to make out dark and light brightness but no visual acuity. His [facility] as his symptoms gradually resolved completely. 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 Since discharge, he has been living in a hotel and has continued to use heroin. This admission, labs showed leukocytosis, lactic acidosis and oliguria. He has been hypotensive, with SBPs in the 80s-90s.   His vision is subjectively back to normal. He currently is experiencing heroin withdrawal symptoms.   Patient was not administered IV t-PA secondary to delay in arrival. He was admitted for further evaluation and treatment.   SUBJECTIVE (INTERVAL HISTORY) The patient is sitting comfortably in bed. He denies any neurological complaints.TEE is pending   OBJECTIVE Temp:  [97.9 F (36.6 C)-98.6 F (37 C)] 98.4 F (36.9 C) (12/27 1018) Pulse Rate:  [54-73] 55 (12/27 1018) Cardiac Rhythm: Sinus bradycardia (12/27 0700) Resp:  [16-19] 16 (12/27 1018) BP: (103-115)/(55-66) 103/56 (12/27 1018) SpO2:  [96 %-99 %] 96 % (12/27 1018) Weight:  [153 lb 3.5 oz (69.5 kg)] 153 lb 3.5 oz (69.5 kg) (12/26 2028)  CBC:  Recent  Labs Lab 12/13/16 1716  12/15/16 0348 12/16/16 0536  WBC 14.7*  < > 27.4* 20.7*  NEUTROABS 14.3*  --   --  16.6*  HGB 11.1*  < > 9.0* 9.4*  HCT 34.9*  < > 28.1* 29.2*  MCV 85.7  < > 84.6 83.4  PLT 228  < > 185 241  < > = values in this interval not displayed.  Basic Metabolic Panel:   Recent Labs Lab 12/14/16 0528 12/15/16 0348 12/16/16 0536  NA 137 139 138  K 4.1 3.5 4.5  CL 106 106 107  CO2 20* 24 22  GLUCOSE 145* 88 96  BUN 23* 19 16  CREATININE 1.66* 1.21 1.14  CALCIUM 7.4* 8.1* 9.0  MG 1.4*  --  1.7  PHOS 4.0  --   --     Lipid Panel:     Component Value Date/Time   CHOL 134 12/17/2016 0629   TRIG 139 12/17/2016 0629   HDL 20 (L) 12/17/2016 0629   CHOLHDL 6.7 12/17/2016 0629   VLDL 28 12/17/2016 0629   LDLCALC 86 12/17/2016 0629   HgbA1c:  Lab Results  Component Value Date   HGBA1C 6.0 (H) 11/09/2016   Urine Drug Screen:     Component Value Date/Time   LABOPIA POSITIVE (A) 11/05/2016 1810   COCAINSCRNUR NONE DETECTED 11/05/2016 1810   LABBENZ POSITIVE (A) 11/05/2016 1810   AMPHETMU POSITIVE (A) 11/05/2016 1810   THCU NONE DETECTED 11/05/2016 1810   LABBARB NONE DETECTED 11/05/2016 1810      IMAGING  Ct Angio Head W Or Wo Contrast  Result Date: 12/13/2016 CLINICAL DATA:  45 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 Dizziness. Injected herion this morning. Subsequent encounter. EXAM: CT ANGIOGRAPHY HEAD AND NECK TECHNIQUE: Multidetector CT imaging of the head and neck was performed using the standard protocol during bolus administration of intravenous contrast. Multiplanar CT image reconstructions and MIPs were obtained to evaluate the vascular anatomy. Carotid stenosis measurements (when applicable) are obtained utilizing NASCET criteria, using the distal internal carotid diameter as the denominator. CONTRAST:  75 cc Isovue 370. COMPARISON:  12/13/2016 head CT. FINDINGS: CT HEAD  FINDINGS Brain: No intracranial hemorrhage or CT evidence of large acute infarct. No intracranial mass or enhancement.  No hydrocephalus. Vascular: As below Skull: No acute abnormality. Sinuses: Mucosal thickening left sphenoid sinus, maxillary sinuses bilaterally and opacification ethmoid sinus air cells bilaterally. Orbits: No acute abnormality. Review of the MIP images confirms the above findings CTA NECK FINDINGS Aortic arch: 3 vessel arch with mild plaque. Right carotid system: Slightly irregular appearance of the right carotid artery throughout its course with mild narrowing which may represent changes of inflammation from drug abuse or atherosclerotic changes. No evidence of dissection or high-grade stenosis. Left carotid system: Slightly irregular appearance of the left carotid artery throughout its course with mild narrowing which may represent changes of inflammation from drug abuse or atherosclerotic change. No evidence of dissection or high-grade stenosis. Vertebral arteries: Left vertebral artery is dominant. No high-grade stenosis of either vertebral artery. Skeleton: Prominent dental disease. Cervical spondylotic changes most notable C6-7. Other neck: Prominent palatine tonsils may indicate inflammation without discrete mass identified. Direct visualization would be necessary to exclude subtle mucosal abnormality. Scattered increased number of small lymph nodes possibly reactive in origin. Upper chest: No lung apical lesion. Review of the MIP images confirms the above findings CTA HEAD FINDINGS Anterior circulation: Mild irregularity of medium and large size vessels of the anterior circulation without high-grade stenosis. Mild to moderate branch vessel irregularity. Posterior circulation: Moderate narrowing distal right vertebral artery. Mild irregularity distal left vertebral artery. Mild to moderate narrowing portions of the basilar artery. Posterior cerebral artery mild to moderate branch vessel  irregularity and narrowing. Venous sinuses: Patent. Anatomic variants: Fetal contribution to the right posterior cerebral artery. Delayed phase: As above. Review of the MIP images confirms the above findings IMPRESSION: CT HEAD No intracranial hemorrhage or CT evidence of large acute infarct. No intracranial mass or enhancement. Mucosal thickening left sphenoid sinus, maxillary sinuses bilaterally and opacification ethmoid sinus air cells bilaterally. CTA NECK Slightly irregular appearance of the carotid artery throughout its course bilaterally with mild narrowing which may represent changes of inflammation from drug abuse or atherosclerotic changes. No evidence of dissection or high-grade stenosis. Left vertebral artery is dominant. No high-grade stenosis of either vertebral artery. Prominent dental disease. Prominent palatine tonsils may indicate inflammation without discrete mass. Direct visualization would be necessary to exclude mucosa abnormality. Scattered increased number of small lymph nodes possibly reactive in origin. CTA HEAD Mild irregularity of medium and large size vessels of the anterior circulation without high-grade stenosis. Mild to mild branch vessel irregularity. Moderate narrowing distal right vertebral artery. Mild irregularity distal left vertebral artery. Mild to moderate narrowing portions of the basilar artery. Posterior cerebral artery mild to moderate branch vessel irregularity and narrowing. Findings may indicate result of vasculopathy related to patient's drug use or secondary to underlying atherosclerotic changes. Electronically Signed  By: Lacy Duverney M.D.   On: 12/13/2016 22:16   Ct Head Wo Contrast  Result Date: 12/13/2016 CLINICAL DATA:  Pt woke up about 2 hrs ago and was blind had a headache He is able to see images now EXAM: CT HEAD WITHOUT CONTRAST TECHNIQUE: Contiguous axial images were obtained from the base of the skull through the vertex without intravenous  contrast. COMPARISON:  None. FINDINGS: Brain: No mass lesion, hemorrhage, hydrocephalus, acute infarct, intra-axial, or extra-axial fluid collection. Vascular: No hyperdense vessel or unexpected calcification. Skull: Normal Sinuses/Orbits: Normal orbits and globes. Mucosal thickening of ethmoid air cells and left sphenoid sinus. Clear mastoid air cells. Other:  None IMPRESSION: 1.  No acute intracranial abnormality. 2. Sinus disease. Electronically Signed   By: Jeronimo Greaves M.D.   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 Dizziness. Injected herion this morning. Subsequent encounter. EXAM: CT ANGIOGRAPHY HEAD AND NECK TECHNIQUE: Multidetector CT imaging of the head and neck was performed using the standard protocol during bolus administration of intravenous contrast. Multiplanar CT image reconstructions and MIPs were obtained to evaluate the vascular anatomy. Carotid stenosis measurements (when applicable) are obtained utilizing NASCET criteria, using the distal internal carotid diameter as the denominator. CONTRAST:  75 cc Isovue 370. COMPARISON:  12/13/2016 head CT. FINDINGS: CT HEAD FINDINGS Brain: No intracranial hemorrhage or CT evidence of large acute infarct. No intracranial mass or enhancement.  No hydrocephalus. Vascular: As below Skull: No acute abnormality. Sinuses: Mucosal thickening left sphenoid sinus, maxillary sinuses bilaterally and opacification ethmoid sinus air cells bilaterally. Orbits: No acute abnormality. Review of the MIP images confirms the above findings CTA NECK FINDINGS Aortic arch: 3 vessel arch with mild plaque. Right carotid system: Slightly irregular appearance of the right carotid artery throughout its course with mild narrowing which may represent changes of inflammation from drug abuse or atherosclerotic  changes. No evidence of dissection or high-grade stenosis. Left carotid system: Slightly irregular appearance of the left carotid artery throughout its course with mild narrowing which may represent changes of inflammation from drug abuse or atherosclerotic change. No evidence of dissection or high-grade stenosis. Vertebral arteries: Left vertebral artery is dominant. No high-grade stenosis of either vertebral artery. Skeleton: Prominent dental disease. Cervical spondylotic changes most notable C6-7. Other neck: Prominent palatine tonsils may indicate inflammation without discrete mass identified. Direct visualization would be necessary to exclude subtle mucosal abnormality. Scattered increased number of small lymph nodes possibly reactive in origin. Upper chest: No lung apical lesion. Review of the MIP images confirms the above findings CTA HEAD FINDINGS Anterior circulation: Mild irregularity of medium and large size vessels of the anterior circulation without high-grade stenosis. Mild to moderate branch vessel irregularity. Posterior circulation: Moderate narrowing distal right vertebral artery. Mild irregularity distal left vertebral artery. Mild to moderate narrowing portions of the basilar artery. Posterior cerebral artery mild to moderate branch vessel irregularity and narrowing. Venous sinuses: Patent. Anatomic variants: Fetal contribution to the right posterior cerebral artery. Delayed phase: As above. Review of the MIP images confirms the above findings IMPRESSION: CT HEAD No intracranial hemorrhage or CT evidence of large acute infarct. No intracranial mass or enhancement. Mucosal thickening left sphenoid sinus, maxillary sinuses bilaterally and opacification ethmoid sinus air cells bilaterally. CTA NECK Slightly irregular appearance of the carotid artery throughout its course bilaterally with  mild narrowing which may represent changes of inflammation from drug abuse or atherosclerotic changes. No  evidence of dissection or high-grade stenosis. Left vertebral artery is dominant. No high-grade stenosis of either vertebral artery. Prominent dental disease. Prominent palatine tonsils may indicate inflammation without discrete mass. Direct visualization would be necessary to exclude mucosa abnormality. Scattered increased number of small lymph nodes possibly reactive in origin. CTA HEAD Mild irregularity of medium and large size vessels of the anterior circulation without high-grade stenosis. Mild to mild branch vessel irregularity. Moderate narrowing distal right vertebral artery. Mild irregularity distal left vertebral artery. Mild to moderate narrowing portions of the basilar artery. Posterior cerebral artery mild to moderate branch vessel irregularity and narrowing. Findings may indicate result of vasculopathy related to patient's drug use or secondary to underlying atherosclerotic changes. Electronically Signed   By: Lacy DuverneySteven  Olson M.D.   On: 12/13/2016 22:16   Mr Laqueta JeanBrain W And Wo Contrast  Result Date: 12/14/2016 CLINICAL DATA:  Transient vision loss. Headache. Fever. Hairline abuse. EXAM: MRI HEAD WITHOUT AND WITH CONTRAST TECHNIQUE: Multiplanar, multiecho pulse sequences of the brain and surrounding structures were obtained without and with intravenous contrast. CONTRAST:  14mL MULTIHANCE GADOBENATE DIMEGLUMINE 529 MG/ML IV SOLN COMPARISON:  Head CT/ CTA 12/13/2016 FINDINGS: The study is mildly motion degraded. Brain: There is a 2 mm punctate focus of restricted diffusion involving cortex or subcortical white matter in the posterior left temporal lobe (series 3, image 23). Similar punctate foci of restricted diffusion are questioned in the right temporal lobe (series 3, image 23) and right parietal lobe (series 9, image 7). There is no evidence of intracranial hemorrhage, mass, midline shift, or extra-axial fluid collection. The ventricles and sulci are normal. No significant chronic ischemic changes are  identified within limitations of motion artifact. No abnormal enhancement is identified. Vascular: Major intracranial vascular flow voids are preserved. Skull and upper cervical spine: Unremarkable bone marrow signal. Sinuses/Orbits: Unremarkable orbits. Mild mucosal thickening throughout the paranasal sinuses. Small right mastoid effusion. Other: None. IMPRESSION: 1. 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 2. Questionable additional tiny embolic infarcts versus artifact in the right temporal and right parietal lobes. Electronically Signed   By: Sebastian AcheAllen  Grady M.D.   On: 12/14/2016 08:18   Dg Chest Portable 1 View  Result Date: 12/13/2016 CLINICAL DATA:  Shortness of breath and productive cough for 2 weeks, worsening. EXAM: PORTABLE CHEST 1 VIEW COMPARISON:  PA and lateral chest 11/04/2016. FINDINGS: There is new right lower lobe airspace disease. Left lung is clear. Heart size is normal. No pneumothorax or pleural effusion. Remote left rib fracture is noted. IMPRESSION: Right lower lobe airspace disease most consistent with pneumonia. Recommend followup to clearing. Electronically Signed   By: Drusilla Kannerhomas  Dalessio M.D.   On: 12/13/2016 20:10   2D echocardiogram - Left ventricle: The cavity size was normal. Wall thickness was increased in a pattern of moderate LVH. Systolic function was normal. The estimated ejection fraction was in the range of 55% to 60%. Wall motion was normal; there were no regional wall motion abnormalities. Doppler parameters are consistent with pseudonormal left ventricular relaxation (grade 1 diastolic dysfunction). The E/e&' ratio is around 15, suggesting borderline elevated LV filling pressure. - Mitral valve: Mildly thickened leaflets . There was mild regurgitation. - Left atrium: The atrium was normal in size. - Right ventricle: The cavity size was normal. Wall thickness was normal. Systolic function was normal. - Right atrium: The atrium was normal in size.  Mobile filamentous  structure at the IVC/RA junction - suspicious for promient chiari network (embryologic variant), however, cannot r/o right-sided endocarditis. - Atrial septum: No defect or patent foramen ovale was identified. Impressions: - LVEF 55-60%, moderate LVH, normal wall motion, diastolic dysfunction, indeterminate LV filling pressure, mild MR, normal LA size, there is a filamentous structure at the RA/IVC junction which is suspicious for a chiari network (embryologic variant), however, I do not have a prior echo to compare to. I cannot exclude that this structure may be a vegetation in the setting of bacteremia. Consider TEE for further evaluation - which may be helpful if vegetations can be seen on the tricuspid and/or pulmonic valve as well.  PHYSICAL EXAM Frail cachectic middle-age Caucasian male  not in any distress. . Afebrile. Head is nontraumatic. Neck is supple without bruit.    Cardiac exam no murmur or gallop. Lungs are clear to auscultation. Distal pulses are well felt. Neurological Exam ;  Awake  Alert oriented x 3. Normal speech and language.eye movements full without nystagmus.fundi were not visualized. Vision acuity and fields appear normal. Hearing is normal. Palatal movements are normal. Face symmetric. Tongue midline. Normal strength, tone, reflexes and coordination. Normal sensation. Gait deferred.  ASSESSMENT/PLAN Jesus Watkins is a 45 y.o. male with history of IVDU with heroin presenting with bilateral vision loss. He did not receive IV t-PA due to delay in arrival.   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 Prominent detnal disease. Prominent palatine  tonsils without discrete mass. Increased scattered small LNs  2D Echo  EF 55-60%, normal LVH. Filamentous structure at RA/IVC jxn - either chiari network vs vegetation in setting of bacteremia  LDL ordered  TEE pending to rule out endocarditis  HgbA1c 6.0 in Nov  Lovenox 40 mg sq daily for VTE prophylaxis Diet regular Room service appropriate? Yes; Fluid consistency: Thin  No antithrombotic prior to admission, now on No antithrombotic add low dose aspirin for secondary stroke prevention per guidelines  Patient counseled to be compliant with his antithrombotic medications  Ongoing aggressive stroke risk factor management  Therapy recommendations:  pending   Disposition:  pending   Hypotension Resolved  Other Stroke Risk Factors  Heroin use  Other IV drug use  Cigarette smoker, advised to stop smoking  Other Active Problems  ARF, resolved  RLL PNA, ? Aspiration vs sepsis vs endocarditis - ID following  Recent compartment syndrome d/t drug use  Hospital day # 4  I have personally examined this patient, reviewed notes, independently viewed imaging studies, participated in medical decision making and plan of care.ROS completed by me personally and pertinent positives fully documented  I have made any additions or clarifications directly to the above note. He presented with transient bilateral vision loss an MRI shows multiple tiny punctate posterior circulation infarcts likely of embolic etiology either from cocaine related arrhythmias , endocarditis or vasospsam. Patient was counseled to quit cocaine and smoking and be compliant with his medications and practice healthy lifestyle and aggressive risk factor modification. Continue aspirin for stroke prevention.TEE for endocarditis  given   history of cellulitis and iv drug abuse.. Discussed with Dr. Jerral Ralph.greater than 50% time during this 25 minute visit was spent on counseling  and coordination of care about his embolic  strokes.  Delia Heady, MD Medical Director [facility] Pager: [phone] 12/17/2016 4:25 PM   To contact Stroke Continuity provider, please refer to [email or web address]. After hours, contact General Neurology

## 2016-12-17 NOTE — Progress Notes (Signed)
PROGRESS NOTE        PATIENT DETAILS Name: Jesus Watkins Age: 45 y.o. Sex: male Date of Birth: 28-Jul-1971 Admit Date: 12/13/2016 Admitting Physician Oretha Milch, MD PCP:No PCP Per Patient  Brief Narrative: Patient is a 45 y.o. 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 See below for further details  Subjective: Visual loss has resolved-he thinks his vision is back to his baseline.  Assessment/Plan: Severe sepsis: Sepsis pathophysiology has resolved. Etiology thought to be either pneumonia or underlying endocarditis. Continue empiric antibiotics, ID following.  Healthcare associated pneumonia: Non toxic-appearing, leukocytosis increasing. Remains on empiric antimicrobial therapy. One set of blood cultures positive for gram positives broad-likely a contaminant. Follow, await further recommendations from infectious disease  Acute left temporal lobe infarct, questionable right temporal/parietal lobe infarcts: Thought to be embolic in nature-high suspicion for underlying endocarditis given IV drug use. CTA of the neck did not show any significant stenosis/dissection. 2-D echocardiogram with EF around 55-60%, filamentous structure at RA/IVC junction-thought to be either a vegetation or a Chiari network. Cardiology consulted on 12/27 for TEE. LDL 86, recent A1c in November was 6.0.  Possible endocarditis: Given clinical scenario of embolic CVA-and history of intravenous drug use-high suspicion for underlying endocarditis. See above regarding blood cultures-TEE admitted to be scheduled for 12/28.  Heroin abuse/withdrawal: No signs of withdrawal at present-begin to taper down methadone even further-changed to every 12 hours. Social worker evaluation prior to discharge.  Acute kidney injury: Resolved, likely due to  sepsis/hypotension.  Homelessness: Social work evaluation prior to discharge  DVT Prophylaxis: Prophylactic Lovenox  Code Status: Full code   Family Communication: None at bedside  Disposition Plan: Remain inpatient-suspect requires a few more days of hospitalization before discharge.  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  CONSULTS:  pulmonary/intensive care, ID and neurology  Time spent: 25 minutes-Greater than 50% of this time was spent in counseling, explanation of diagnosis, planning of further management, and coordination of care.  MEDICATIONS: Anti-infectives    Start     Dose/Rate Route Frequency Ordered Stop   12/16/16 1000  vancomycin (VANCOCIN) IVPB 1000 mg/200 mL premix     1,000 mg 200 mL/hr over 60 Minutes Intravenous Every 12 hours 12/16/16 0930     12/14/16 0800  vancomycin (VANCOCIN) IVPB 1000 mg/200 mL premix  Status:  Discontinued     1,000 mg 200 mL/hr over 60 Minutes Intravenous Every 12 hours 12/13/16 2019 12/16/16 0930   12/14/16 0200  piperacillin-tazobactam (ZOSYN) IVPB 3.375 g     3.375 g 12.5 mL/hr over 240 Minutes Intravenous Every 8 hours 12/13/16 2019     12/13/16 2015  piperacillin-tazobactam (ZOSYN) IVPB 3.375 g     3.375 g 100 mL/hr over 30 Minutes Intravenous  Once 12/13/16 2007 12/13/16 2136   12/13/16 2015  vancomycin (VANCOCIN) IVPB 1000 mg/200 mL premix     1,000 mg 200 mL/hr over 60 Minutes Intravenous  Once 12/13/16 2007 12/13/16 2229      Scheduled Meds: . aspirin EC  81 mg Oral Daily  . cloNIDine  0.2 mg Transdermal Weekly  .  enoxaparin (LOVENOX) injection  40 mg Subcutaneous Q24H  . mouth rinse  15 mL Mouth Rinse BID  . methadone  5 mg Oral Q8H  . piperacillin-tazobactam (ZOSYN)  IV  3.375 g Intravenous Q8H  . vancomycin  1,000  mg Intravenous Q12H   Continuous Infusions: PRN Meds:.LORazepam, ondansetron (ZOFRAN) IV   PHYSICAL EXAM: Vital signs: Vitals:   12/16/16 2028 12/17/16 0600 12/17/16 1018 12/17/16 1637  BP: 115/63 (!) 111/55 (!) 103/56 (!) 95/56  Pulse: (!) 58 (!) 54 (!) 55 (!) 54  Resp: 18 19 16 16   Temp: 98.6 F (37 C) 98.1 F (36.7 C) 98.4 F (36.9 C) 98.4 F (36.9 C)  TempSrc: Oral  Oral Oral  SpO2: 99% 97% 96% 97%  Weight: 69.5 kg (153 lb 3.5 oz)     Height:       Filed Weights   12/15/16 0500 12/15/16 2048 12/16/16 2028  Weight: 69.4 kg (153 lb) 69.3 kg (152 lb 12.5 oz) 69.5 kg (153 lb 3.5 oz)   Body mass index is 23.3 kg/m.   General appearance :Awake, alert, not in any distress. Speech Clear. Not toxic Looking Eyes:, pupils equally reactive to light and accomodation,no scleral icterus.Pink conjunctiva HEENT: Atraumatic and Normocephalic Neck: supple, no JVD. No cervical lymphadenopathy. No thyromegaly Resp:Good air entry bilaterally, no added sounds  CVS: S1 S2 regular, no murmurs.  GI: Bowel sounds present, Non tender and not distended with no gaurding, rigidity or rebound.No organomegaly Extremities: B/L Lower Ext shows no edema, both legs are warm to touch Neurology:  speech clear,Non focal, sensation is grossly intact. Psychiatric: Normal judgment and insight. Alert and oriented x 3. Normal mood. Musculoskeletal:gait appears to be normal.No digital cyanosis Skin:No Rash, warm and dry Wounds:N/A  I have personally reviewed following labs and imaging studies  LABORATORY DATA: CBC:  Recent Labs Lab 12/13/16 1716 12/13/16 1735 12/14/16 0528 12/15/16 0348 12/16/16 0536  WBC 14.7*  --  28.4* 27.4* 20.7*  NEUTROABS 14.3*  --   --   --  16.6*  HGB 11.1* 11.2* 9.8* 9.0* 9.4*  HCT 34.9* 33.0* 30.8* 28.1* 29.2*  MCV 85.7  --  83.9 84.6 83.4  PLT 228  --  170 185 241    Basic Metabolic Panel:  Recent Labs Lab 12/13/16 1716 12/13/16 1735 12/14/16 0528  12/15/16 0348 12/16/16 0536  NA 135 135 137 139 138  K 4.1 4.1 4.1 3.5 4.5  CL 99* 99* 106 106 107  CO2 22  --  20* 24 22  GLUCOSE 158* 161* 145* 88 96  BUN 21* 22* 23* 19 16  CREATININE 1.74* 1.40* 1.66* 1.21 1.14  CALCIUM 8.9  --  7.4* 8.1* 9.0  MG  --   --  1.4*  --  1.7  PHOS  --   --  4.0  --   --     GFR: Estimated Creatinine Clearance: 80 mL/min (by C-G formula based on SCr of 1.14 mg/dL).  Liver Function Tests:  Recent Labs Lab 12/13/16 1716 12/16/16 0536  AST 120* 20  ALT 39 23  ALKPHOS 235* 147*  BILITOT 0.8 0.4  PROT 6.6 6.4*  ALBUMIN 3.2* 2.7*   No results for input(s): LIPASE, AMYLASE in the last 168 hours. No results for input(s): AMMONIA in the last 168 hours.  Coagulation Profile:  Recent Labs Lab 12/13/16 1716  INR 1.25    Cardiac Enzymes:  Recent Labs Lab 12/15/16 1052 12/15/16 1630 12/15/16 2212  TROPONINI <0.03 <0.03 <0.03  BNP (last 3 results) No results for input(s): PROBNP in the last 8760 hours.  HbA1C: No results for input(s): HGBA1C in the last 72 hours.  CBG: No results for input(s): GLUCAP in the last 168 hours.  Lipid Profile:  Recent Labs  12/17/16 0629  CHOL 134  HDL 20*  LDLCALC 86  TRIG 161  CHOLHDL 6.7    Thyroid Function Tests: No results for input(s): TSH, T4TOTAL, FREET4, T3FREE, THYROIDAB in the last 72 hours.  Anemia Panel: No results for input(s): VITAMINB12, FOLATE, FERRITIN, TIBC, IRON, RETICCTPCT in the last 72 hours.  Urine analysis:    Component Value Date/Time   COLORURINE YELLOW 12/13/2016 2310   APPEARANCEUR HAZY (A) 12/13/2016 2310   LABSPEC 1.034 (H) 12/13/2016 2310   PHURINE 5.0 12/13/2016 2310   GLUCOSEU NEGATIVE 12/13/2016 2310   HGBUR SMALL (A) 12/13/2016 2310   BILIRUBINUR NEGATIVE 12/13/2016 2310   KETONESUR NEGATIVE 12/13/2016 2310   PROTEINUR 100 (A) 12/13/2016 2310   NITRITE NEGATIVE 12/13/2016 2310   LEUKOCYTESUR NEGATIVE 12/13/2016 2310    Sepsis Labs: Lactic  Acid, Venous    Component Value Date/Time   LATICACIDVEN 1.1 12/15/2016 1000    MICROBIOLOGY: Recent Results (from the past 240 hour(s))  Blood culture (routine x 2)     Status: None (Preliminary result)   Collection Time: 12/13/16  7:20 PM  Result Value Ref Range Status   Specimen Description BLOOD RIGHT UPPER ARM  Final   Special Requests BOTTLES DRAWN AEROBIC AND ANAEROBIC 5CC EA  Final   Culture  Setup Time   Final    GRAM POSITIVE RODS ANAEROBIC BOTTLE ONLY Organism ID to follow CRITICAL RESULT CALLED TO, READ BACK BY AND VERIFIED WITH: ADagoberto Ligas.D. 15:00 12/17/16 ([email or web address])    Culture NO GROWTH 4 DAYS  Final   Report Status PENDING  Incomplete  Blood Culture ID Panel (Reflexed)     Status: None   Collection Time: 12/13/16  7:20 PM  Result Value Ref Range Status   Enterococcus species NOT DETECTED NOT DETECTED Final   Listeria monocytogenes NOT DETECTED NOT DETECTED Final   Staphylococcus species NOT DETECTED NOT DETECTED Final   Staphylococcus aureus NOT DETECTED NOT DETECTED Final   Streptococcus species NOT DETECTED NOT DETECTED Final   Streptococcus agalactiae NOT DETECTED NOT DETECTED Final   Streptococcus pneumoniae NOT DETECTED NOT DETECTED Final   Streptococcus pyogenes NOT DETECTED NOT DETECTED Final   Acinetobacter baumannii NOT DETECTED NOT DETECTED Final   Enterobacteriaceae species NOT DETECTED NOT DETECTED Final   Enterobacter cloacae complex NOT DETECTED NOT DETECTED Final   Escherichia coli NOT DETECTED NOT DETECTED Final   Klebsiella oxytoca NOT DETECTED NOT DETECTED Final   Klebsiella pneumoniae NOT DETECTED NOT DETECTED Final   Proteus species NOT DETECTED NOT DETECTED Final   Serratia marcescens NOT DETECTED NOT DETECTED Final   Haemophilus influenzae NOT DETECTED NOT DETECTED Final   Neisseria meningitidis NOT DETECTED NOT DETECTED Final   Pseudomonas aeruginosa NOT DETECTED NOT DETECTED Final   Candida albicans NOT DETECTED NOT DETECTED  Final   Candida glabrata NOT DETECTED NOT DETECTED Final   Candida krusei NOT DETECTED NOT DETECTED Final   Candida parapsilosis NOT DETECTED NOT DETECTED Final   Candida tropicalis NOT DETECTED NOT DETECTED Final  Blood culture (routine x 2)     Status: None (Preliminary result)   Collection Time: 12/13/16  7:30 PM  Result Value Ref Range Status   Specimen Description BLOOD LEFT HAND  Final  Special Requests BOTTLES DRAWN AEROBIC AND ANAEROBIC 5CC EA  Final   Culture NO GROWTH 4 DAYS  Final   Report Status PENDING  Incomplete  Urine culture     Status: None   Collection Time: 12/13/16 11:10 PM  Result Value Ref Range Status   Specimen Description URINE, CATHETERIZED  Final   Special Requests NONE  Final   Culture NO GROWTH  Final   Report Status 12/15/2016 FINAL  Final  MRSA PCR Screening     Status: None   Collection Time: 12/14/16 12:53 AM  Result Value Ref Range Status   MRSA by PCR NEGATIVE NEGATIVE Final    Comment:        The GeneXpert MRSA Assay (FDA approved for NASAL specimens only), is one component of a comprehensive MRSA colonization surveillance program. It is not intended to diagnose MRSA infection nor to guide or monitor treatment for MRSA infections.     RADIOLOGY STUDIES/RESULTS: Ct Angio Head W Or Wo Contrast  Result Date: 12/13/2016 CLINICAL DATA:  45 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 Dizziness. Injected herion this morning. Subsequent encounter. EXAM: CT ANGIOGRAPHY HEAD AND NECK TECHNIQUE: Multidetector CT imaging of the head and neck was performed using the standard protocol during bolus administration of intravenous contrast. Multiplanar CT image reconstructions and MIPs were obtained to evaluate the vascular anatomy. Carotid stenosis measurements (when applicable) are obtained utilizing NASCET criteria, using the distal internal carotid diameter as the denominator. CONTRAST:   75 cc Isovue 370. COMPARISON:  12/13/2016 head CT. FINDINGS: CT HEAD FINDINGS Brain: No intracranial hemorrhage or CT evidence of large acute infarct. No intracranial mass or enhancement.  No hydrocephalus. Vascular: As below Skull: No acute abnormality. Sinuses: Mucosal thickening left sphenoid sinus, maxillary sinuses bilaterally and opacification ethmoid sinus air cells bilaterally. Orbits: No acute abnormality. Review of the MIP images confirms the above findings CTA NECK FINDINGS Aortic arch: 3 vessel arch with mild plaque. Right carotid system: Slightly irregular appearance of the right carotid artery throughout its course with mild narrowing which may represent changes of inflammation from drug abuse or atherosclerotic changes. No evidence of dissection or high-grade stenosis. Left carotid system: Slightly irregular appearance of the left carotid artery throughout its course with mild narrowing which may represent changes of inflammation from drug abuse or atherosclerotic change. No evidence of dissection or high-grade stenosis. Vertebral arteries: Left vertebral artery is dominant. No high-grade stenosis of either vertebral artery. Skeleton: Prominent dental disease. Cervical spondylotic changes most notable C6-7. Other neck: Prominent palatine tonsils may indicate inflammation without discrete mass identified. Direct visualization would be necessary to exclude subtle mucosal abnormality. Scattered increased number of small lymph nodes possibly reactive in origin. Upper chest: No lung apical lesion. Review of the MIP images confirms the above findings CTA HEAD FINDINGS Anterior circulation: Mild irregularity of medium and large size vessels of the anterior circulation without high-grade stenosis. Mild to moderate branch vessel irregularity. Posterior circulation: Moderate narrowing distal right vertebral artery. Mild irregularity distal left vertebral artery. Mild to moderate narrowing portions of the  basilar artery. Posterior cerebral artery mild to moderate branch vessel irregularity and narrowing. Venous sinuses: Patent. Anatomic variants: Fetal contribution to the right posterior cerebral artery. Delayed phase: As above. Review of the MIP images confirms the above findings IMPRESSION: CT HEAD No intracranial hemorrhage or CT evidence of large acute infarct. No intracranial mass or enhancement. Mucosal thickening left sphenoid sinus,  maxillary sinuses bilaterally and opacification ethmoid sinus air cells bilaterally. CTA NECK Slightly irregular appearance of the carotid artery throughout its course bilaterally with mild narrowing which may represent changes of inflammation from drug abuse or atherosclerotic changes. No evidence of dissection or high-grade stenosis. Left vertebral artery is dominant. No high-grade stenosis of either vertebral artery. Prominent dental disease. Prominent palatine tonsils may indicate inflammation without discrete mass. Direct visualization would be necessary to exclude mucosa abnormality. Scattered increased number of small lymph nodes possibly reactive in origin. CTA HEAD Mild irregularity of medium and large size vessels of the anterior circulation without high-grade stenosis. Mild to mild branch vessel irregularity. Moderate narrowing distal right vertebral artery. Mild irregularity distal left vertebral artery. Mild to moderate narrowing portions of the basilar artery. Posterior cerebral artery mild to moderate branch vessel irregularity and narrowing. Findings may indicate result of vasculopathy related to patient's drug use or secondary to underlying atherosclerotic changes. Electronically Signed   By: Lacy Duverney M.D.   On: 12/13/2016 22:16   Ct Head Wo Contrast  Result Date: 12/13/2016 CLINICAL DATA:  Pt woke up about 2 hrs ago and was blind had a headache He is able to see images now EXAM: CT HEAD WITHOUT CONTRAST TECHNIQUE: Contiguous axial images were obtained  from the base of the skull through the vertex without intravenous contrast. COMPARISON:  None. FINDINGS: Brain: No mass lesion, hemorrhage, hydrocephalus, acute infarct, intra-axial, or extra-axial fluid collection. Vascular: No hyperdense vessel or unexpected calcification. Skull: Normal Sinuses/Orbits: Normal orbits and globes. Mucosal thickening of ethmoid air cells and left sphenoid sinus. Clear mastoid air cells. Other:  None IMPRESSION: 1.  No acute intracranial abnormality. 2. Sinus disease. Electronically Signed   By: Jeronimo Greaves M.D.   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 Dizziness. Injected herion this morning. Subsequent encounter. EXAM: CT ANGIOGRAPHY HEAD AND NECK TECHNIQUE: Multidetector CT imaging of the head and neck was performed using the standard protocol during bolus administration of intravenous contrast. Multiplanar CT image reconstructions and MIPs were obtained to evaluate the vascular anatomy. Carotid stenosis measurements (when applicable) are obtained utilizing NASCET criteria, using the distal internal carotid diameter as the denominator. CONTRAST:  75 cc Isovue 370. COMPARISON:  12/13/2016 head CT. FINDINGS: CT HEAD FINDINGS Brain: No intracranial hemorrhage or CT evidence of large acute infarct. No intracranial mass or enhancement.  No hydrocephalus. Vascular: As below Skull: No acute abnormality. Sinuses: Mucosal thickening left sphenoid sinus, maxillary sinuses bilaterally and opacification ethmoid sinus air cells bilaterally. Orbits: No acute abnormality. Review of the MIP images confirms the above findings CTA NECK FINDINGS Aortic arch: 3 vessel arch with mild plaque. Right carotid system: Slightly irregular appearance of the right carotid artery throughout its course with mild narrowing which may  represent changes of inflammation from drug abuse or atherosclerotic changes. No evidence of dissection or high-grade stenosis. Left carotid system: Slightly irregular appearance of the left carotid artery throughout its course with mild narrowing which may represent changes of inflammation from drug abuse or atherosclerotic change. No evidence of dissection or high-grade stenosis. Vertebral arteries: Left vertebral artery is dominant. No high-grade stenosis of either vertebral artery. Skeleton: Prominent dental disease. Cervical spondylotic changes most notable C6-7. Other neck: Prominent palatine tonsils may indicate inflammation without discrete mass identified. Direct visualization would be  necessary to exclude subtle mucosal abnormality. Scattered increased number of small lymph nodes possibly reactive in origin. Upper chest: No lung apical lesion. Review of the MIP images confirms the above findings CTA HEAD FINDINGS Anterior circulation: Mild irregularity of medium and large size vessels of the anterior circulation without high-grade stenosis. Mild to moderate branch vessel irregularity. Posterior circulation: Moderate narrowing distal right vertebral artery. Mild irregularity distal left vertebral artery. Mild to moderate narrowing portions of the basilar artery. Posterior cerebral artery mild to moderate branch vessel irregularity and narrowing. Venous sinuses: Patent. Anatomic variants: Fetal contribution to the right posterior cerebral artery. Delayed phase: As above. Review of the MIP images confirms the above findings IMPRESSION: CT HEAD No intracranial hemorrhage or CT evidence of large acute infarct. No intracranial mass or enhancement. Mucosal thickening left sphenoid sinus, maxillary sinuses bilaterally and opacification ethmoid sinus air cells bilaterally. CTA NECK Slightly irregular appearance of the carotid artery throughout its course bilaterally with mild narrowing which may represent changes  of inflammation from drug abuse or atherosclerotic changes. No evidence of dissection or high-grade stenosis. Left vertebral artery is dominant. No high-grade stenosis of either vertebral artery. Prominent dental disease. Prominent palatine tonsils may indicate inflammation without discrete mass. Direct visualization would be necessary to exclude mucosa abnormality. Scattered increased number of small lymph nodes possibly reactive in origin. CTA HEAD Mild irregularity of medium and large size vessels of the anterior circulation without high-grade stenosis. Mild to mild branch vessel irregularity. Moderate narrowing distal right vertebral artery. Mild irregularity distal left vertebral artery. Mild to moderate narrowing portions of the basilar artery. Posterior cerebral artery mild to moderate branch vessel irregularity and narrowing. Findings may indicate result of vasculopathy related to patient's drug use or secondary to underlying atherosclerotic changes. Electronically Signed   By: Lacy Duverney M.D.   On: 12/13/2016 22:16   Mr Laqueta Jean And Wo Contrast  Result Date: 12/14/2016 CLINICAL DATA:  Transient vision loss. Headache. Fever. Hairline abuse. EXAM: MRI HEAD WITHOUT AND WITH CONTRAST TECHNIQUE: Multiplanar, multiecho pulse sequences of the brain and surrounding structures were obtained without and with intravenous contrast. CONTRAST:  14mL MULTIHANCE GADOBENATE DIMEGLUMINE 529 MG/ML IV SOLN COMPARISON:  Head CT/ CTA 12/13/2016 FINDINGS: The study is mildly motion degraded. Brain: There is a 2 mm punctate focus of restricted diffusion involving cortex or subcortical white matter in the posterior left temporal lobe (series 3, image 23). Similar punctate foci of restricted diffusion are questioned in the right temporal lobe (series 3, image 23) and right parietal lobe (series 9, image 7). There is no evidence of intracranial hemorrhage, mass, midline shift, or extra-axial fluid collection. The ventricles  and sulci are normal. No significant chronic ischemic changes are identified within limitations of motion artifact. No abnormal enhancement is identified. Vascular: Major intracranial vascular flow voids are preserved. Skull and upper cervical spine: Unremarkable bone marrow signal. Sinuses/Orbits: Unremarkable orbits. Mild mucosal thickening throughout the paranasal sinuses. Small right mastoid effusion. Other: None. IMPRESSION: 1. 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 2. Questionable additional tiny embolic infarcts versus artifact in the right temporal and right parietal lobes. Electronically Signed   By: Sebastian Ache M.D.   On: 12/14/2016 08:18   Dg Chest Portable 1 View  Result Date: 12/13/2016 CLINICAL DATA:  Shortness of breath and productive cough for 2 weeks, worsening. EXAM: PORTABLE CHEST 1 VIEW COMPARISON:  PA and lateral chest 11/04/2016. FINDINGS: There is new right lower lobe airspace disease. Left lung is clear.  Heart size is normal. No pneumothorax or pleural effusion. Remote left rib fracture is noted. IMPRESSION: Right lower lobe airspace disease most consistent with pneumonia. Recommend followup to clearing. Electronically Signed   By: Drusilla Kannerhomas  Dalessio M.D.   On: 12/13/2016 20:10     LOS: 4 days   Jeoffrey MassedGHIMIRE,SHANKER, MD  [facility] Pager:[phone]  If 7PM-7AM, please contact night-coverage [email or web address] Password [email or web address] 12/17/2016, 5:07 PM

## 2016-12-17 NOTE — Progress Notes (Signed)
Pt not wearing telemetry due to his own choosing. Pt states "It is in the way". Nurse educated pt and notified MD and Central Tele.

## 2016-12-18 MED ORDER — METHADONE HCL 5 MG PO TABS
5.0000 mg | ORAL_TABLET | Freq: Every day | ORAL | Status: DC
Start: 2016-12-19 — End: 2016-12-22
  Administered 2016-12-19 – 2016-12-22 (×4): 5 mg via ORAL
  Filled 2016-12-18 (×4): qty 1

## 2016-12-18 NOTE — Progress Notes (Signed)
    [facility] has been requested to perform a transesophageal echocardiogram on Jesus Watkins for cerebrovascular accident/ possible endocarditis.  After careful review of history and examination, the risks and benefits of transesophageal echocardiogram have been explained including risks of esophageal damage, perforation (1:10,000 risk), bleeding, pharyngeal hematoma as well as other potential complications associated with conscious sedation including aspiration, arrhythmia, respiratory failure and death. Alternatives to treatment were discussed, questions were answered. Patient is willing to proceed.   BP stable within the past 24 hours with no requirement of pressors. Hgb 9.4 and platelets 241.  Scheduled for 12/19/2016 at 11:00 with Dr. Royann Shiversroitoru. Scheduled with anesthesia given history of IVDU.   Ellsworth LennoxBrittany M Mariangela Heldt, [place]  12/18/2016 5:34 PM

## 2016-12-18 NOTE — Progress Notes (Signed)
PROGRESS NOTE        PATIENT DETAILS Name: Jesus Watkins Age: 45 y.o. Sex: male Date of Birth: 09/25/1971 Admit Date: 12/13/2016 Admitting Physician Oretha Milchakesh V Alva, MD PCP:No PCP Per Patient  Brief Narrative: Patient is a 45 y.o. 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 See below for further details  Subjective: Visual loss has resolved-he thinks his vision is back to his baseline.  Assessment/Plan: Severe sepsis: Sepsis pathophysiology has resolved. Etiology thought to be either pneumonia or underlying endocarditis. Continue empiric antibiotics, ID following.  Healthcare associated pneumonia: Non toxic-appearing, leukocytosis increasing. Remains on empiric antimicrobial therapy. One set of blood cultures positive for bacillus species-likely a contaminant. Follow, await further recommendations from infectious disease  Acute left temporal lobe infarct, questionable right temporal/parietal lobe infarcts: Thought to be embolic in nature-high suspicion for underlying endocarditis given IV drug use. CTA of the neck did not show any significant stenosis/dissection. 2-D echocardiogram with EF around 55-60%, filamentous structure at RA/IVC junction-thought to be either a vegetation or a Chiari network. Cardiology consulted on 12/27 for TEE-scheduled for 12/28. LDL 86, recent A1c in November was 6.0.  Possible endocarditis: Given clinical scenario of embolic CVA-and history of intravenous drug use-high suspicion for underlying endocarditis. See above regarding blood cultures-TEE admitted to be scheduled for 12/28.  Heroin abuse/withdrawal: No signs of withdrawal at present-begin to taper down methadone even further-changed to every 24 hours. Social worker evaluation prior to discharge.  Acute kidney injury: Resolved,  likely due to sepsis/hypotension.  Homelessness: Social work evaluation prior to discharge  DVT Prophylaxis: Prophylactic Lovenox  Code Status: Full code   Family Communication: None at bedside  Disposition Plan: Remain inpatient-suspect requires a few more days of hospitalization before discharge.  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  CONSULTS:  pulmonary/intensive care, ID and neurology  Time spent: 25 minutes-Greater than 50% of this time was spent in counseling, explanation of diagnosis, planning of further management, and coordination of care.  MEDICATIONS: Anti-infectives    Start     Dose/Rate Route Frequency Ordered Stop   12/16/16 1000  vancomycin (VANCOCIN) IVPB 1000 mg/200 mL premix     1,000 mg 200 mL/hr over 60 Minutes Intravenous Every 12 hours 12/16/16 0930     12/14/16 0800  vancomycin (VANCOCIN) IVPB 1000 mg/200 mL premix  Status:  Discontinued     1,000 mg 200 mL/hr over 60 Minutes Intravenous Every 12 hours 12/13/16 2019 12/16/16 0930   12/14/16 0200  piperacillin-tazobactam (ZOSYN) IVPB 3.375 g     3.375 g 12.5 mL/hr over 240 Minutes Intravenous Every 8 hours 12/13/16 2019     12/13/16 2015  piperacillin-tazobactam (ZOSYN) IVPB 3.375 g     3.375 g 100 mL/hr over 30 Minutes Intravenous  Once 12/13/16 2007 12/13/16 2136   12/13/16 2015  vancomycin (VANCOCIN) IVPB 1000 mg/200 mL premix     1,000 mg 200 mL/hr over 60 Minutes Intravenous  Once 12/13/16 2007 12/13/16 2229      Scheduled Meds: . aspirin EC  81 mg Oral Daily  . cloNIDine  0.2 mg Transdermal Weekly  .  enoxaparin (LOVENOX) injection  40 mg Subcutaneous Q24H  . mouth rinse  15 mL Mouth Rinse BID  . methadone  5 mg Oral Q12H  . piperacillin-tazobactam (ZOSYN)  IV  3.375 g Intravenous Q8H  .  vancomycin  1,000 mg Intravenous Q12H   Continuous Infusions: PRN Meds:.LORazepam, ondansetron (ZOFRAN) IV   PHYSICAL EXAM: Vital signs: Vitals:   12/17/16 1637 12/17/16 2102 12/18/16 0555 12/18/16 0849  BP: (!) 95/56 (!) 95/55 (!) 112/57 (!) 110/52  Pulse: (!) 54 (!) 54 (!) 52 60  Resp: 16 16 16 17   Temp: 98.4 F (36.9 C) 98.8 F (37.1 C) 97.6 F (36.4 C) 98 F (36.7 C)  TempSrc: Oral Oral Oral Oral  SpO2: 97% 99% 98% 99%  Weight:  69.8 kg (153 lb 14.1 oz)    Height:       Filed Weights   12/15/16 2048 12/16/16 2028 12/17/16 2102  Weight: 69.3 kg (152 lb 12.5 oz) 69.5 kg (153 lb 3.5 oz) 69.8 kg (153 lb 14.1 oz)   Body mass index is 23.4 kg/m.   General appearance :Awake, alert, not in any distress. Speech Clear. Not toxic Looking Eyes:, pupils equally reactive to light and accomodation,no scleral icterus.Pink conjunctiva HEENT: Atraumatic and Normocephalic Neck: supple, no JVD. No cervical lymphadenopathy. No thyromegaly Resp:Good air entry bilaterally, no added sounds  CVS: S1 S2 regular, no murmurs.  GI: Bowel sounds present, Non tender and not distended with no gaurding, rigidity or rebound.No organomegaly Extremities: B/L Lower Ext shows no edema, both legs are warm to touch Neurology:  speech clear,Non focal, sensation is grossly intact. Psychiatric: Normal judgment and insight. Alert and oriented x 3. Normal mood. Musculoskeletal:gait appears to be normal.No digital cyanosis Skin:No Rash, warm and dry Wounds:N/A  I have personally reviewed following labs and imaging studies  LABORATORY DATA: CBC:  Recent Labs Lab 12/13/16 1716 12/13/16 1735 12/14/16 0528 12/15/16 0348 12/16/16 0536  WBC 14.7*  --  28.4* 27.4* 20.7*  NEUTROABS 14.3*  --   --   --  16.6*  HGB 11.1* 11.2* 9.8* 9.0* 9.4*  HCT 34.9* 33.0* 30.8* 28.1* 29.2*  MCV 85.7  --  83.9 84.6 83.4  PLT 228  --  170 185 241    Basic Metabolic Panel:  Recent Labs Lab 12/13/16 1716  12/13/16 1735 12/14/16 0528 12/15/16 0348 12/16/16 0536  NA 135 135 137 139 138  K 4.1 4.1 4.1 3.5 4.5  CL 99* 99* 106 106 107  CO2 22  --  20* 24 22  GLUCOSE 158* 161* 145* 88 96  BUN 21* 22* 23* 19 16  CREATININE 1.74* 1.40* 1.66* 1.21 1.14  CALCIUM 8.9  --  7.4* 8.1* 9.0  MG  --   --  1.4*  --  1.7  PHOS  --   --  4.0  --   --     GFR: Estimated Creatinine Clearance: 80 mL/min (by C-G formula based on SCr of 1.14 mg/dL).  Liver Function Tests:  Recent Labs Lab 12/13/16 1716 12/16/16 0536  AST 120* 20  ALT 39 23  ALKPHOS 235* 147*  BILITOT 0.8 0.4  PROT 6.6 6.4*  ALBUMIN 3.2* 2.7*   No results for input(s): LIPASE, AMYLASE in the last 168 hours. No results for input(s): AMMONIA in the last 168 hours.  Coagulation Profile:  Recent Labs Lab 12/13/16 1716  INR 1.25    Cardiac Enzymes:  Recent Labs Lab 12/15/16 1052 12/15/16 1630 12/15/16 2212  TROPONINI <0.03 <0.03 <  0.03    BNP (last 3 results) No results for input(s): PROBNP in the last 8760 hours.  HbA1C: No results for input(s): HGBA1C in the last 72 hours.  CBG: No results for input(s): GLUCAP in the last 168 hours.  Lipid Profile:  Recent Labs  12/17/16 0629  CHOL 134  HDL 20*  LDLCALC 86  TRIG 161  CHOLHDL 6.7    Thyroid Function Tests: No results for input(s): TSH, T4TOTAL, FREET4, T3FREE, THYROIDAB in the last 72 hours.  Anemia Panel: No results for input(s): VITAMINB12, FOLATE, FERRITIN, TIBC, IRON, RETICCTPCT in the last 72 hours.  Urine analysis:    Component Value Date/Time   COLORURINE YELLOW 12/13/2016 2310   APPEARANCEUR HAZY (A) 12/13/2016 2310   LABSPEC 1.034 (H) 12/13/2016 2310   PHURINE 5.0 12/13/2016 2310   GLUCOSEU NEGATIVE 12/13/2016 2310   HGBUR SMALL (A) 12/13/2016 2310   BILIRUBINUR NEGATIVE 12/13/2016 2310   KETONESUR NEGATIVE 12/13/2016 2310   PROTEINUR 100 (A) 12/13/2016 2310   NITRITE NEGATIVE 12/13/2016 2310   LEUKOCYTESUR NEGATIVE 12/13/2016  2310    Sepsis Labs: Lactic Acid, Venous    Component Value Date/Time   LATICACIDVEN 1.1 12/15/2016 1000    MICROBIOLOGY: Recent Results (from the past 240 hour(s))  Blood culture (routine x 2)     Status: Abnormal (Preliminary result)   Collection Time: 12/13/16  7:20 PM  Result Value Ref Range Status   Specimen Description BLOOD RIGHT UPPER ARM  Final   Special Requests BOTTLES DRAWN AEROBIC AND ANAEROBIC 5CC EA  Final   Culture  Setup Time   Final    GRAM POSITIVE RODS ANAEROBIC BOTTLE ONLY Organism ID to follow CRITICAL RESULT CALLED TO, READ BACK BY AND VERIFIED WITH: ADagoberto Ligas.D. 15:00 12/17/16 ([email or web address])    Culture (A)  Final    BACILLUS SPECIES Standardized susceptibility testing for this organism is not available.    Report Status PENDING  Incomplete  Blood Culture ID Panel (Reflexed)     Status: None   Collection Time: 12/13/16  7:20 PM  Result Value Ref Range Status   Enterococcus species NOT DETECTED NOT DETECTED Final   Listeria monocytogenes NOT DETECTED NOT DETECTED Final   Staphylococcus species NOT DETECTED NOT DETECTED Final   Staphylococcus aureus NOT DETECTED NOT DETECTED Final   Streptococcus species NOT DETECTED NOT DETECTED Final   Streptococcus agalactiae NOT DETECTED NOT DETECTED Final   Streptococcus pneumoniae NOT DETECTED NOT DETECTED Final   Streptococcus pyogenes NOT DETECTED NOT DETECTED Final   Acinetobacter baumannii NOT DETECTED NOT DETECTED Final   Enterobacteriaceae species NOT DETECTED NOT DETECTED Final   Enterobacter cloacae complex NOT DETECTED NOT DETECTED Final   Escherichia coli NOT DETECTED NOT DETECTED Final   Klebsiella oxytoca NOT DETECTED NOT DETECTED Final   Klebsiella pneumoniae NOT DETECTED NOT DETECTED Final   Proteus species NOT DETECTED NOT DETECTED Final   Serratia marcescens NOT DETECTED NOT DETECTED Final   Haemophilus influenzae NOT DETECTED NOT DETECTED Final   Neisseria meningitidis NOT DETECTED NOT  DETECTED Final   Pseudomonas aeruginosa NOT DETECTED NOT DETECTED Final   Candida albicans NOT DETECTED NOT DETECTED Final   Candida glabrata NOT DETECTED NOT DETECTED Final   Candida krusei NOT DETECTED NOT DETECTED Final   Candida parapsilosis NOT DETECTED NOT DETECTED Final   Candida tropicalis NOT DETECTED NOT DETECTED Final  Blood culture (routine x 2)     Status: None (Preliminary result)   Collection Time: 12/13/16  7:30 PM  Result Value Ref Range Status   Specimen Description BLOOD LEFT HAND  Final   Special Requests BOTTLES DRAWN AEROBIC AND ANAEROBIC 5CC EA  Final   Culture  Setup Time   Final    GRAM POSITIVE RODS ANAEROBIC BOTTLE ONLY CRITICAL VALUE NOTED.  VALUE IS CONSISTENT WITH PREVIOUSLY REPORTED AND CALLED VALUE.    Culture GRAM POSITIVE RODS  Final   Report Status PENDING  Incomplete  Urine culture     Status: None   Collection Time: 12/13/16 11:10 PM  Result Value Ref Range Status   Specimen Description URINE, CATHETERIZED  Final   Special Requests NONE  Final   Culture NO GROWTH  Final   Report Status 12/15/2016 FINAL  Final  MRSA PCR Screening     Status: None   Collection Time: 12/14/16 12:53 AM  Result Value Ref Range Status   MRSA by PCR NEGATIVE NEGATIVE Final    Comment:        The GeneXpert MRSA Assay (FDA approved for NASAL specimens only), is one component of a comprehensive MRSA colonization surveillance program. It is not intended to diagnose MRSA infection nor to guide or monitor treatment for MRSA infections.     RADIOLOGY STUDIES/RESULTS: Ct Angio Head W Or Wo Contrast  Result Date: 12/13/2016 CLINICAL DATA:  45 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 Dizziness. Injected herion this morning. Subsequent encounter. EXAM: CT ANGIOGRAPHY HEAD AND NECK TECHNIQUE: Multidetector CT imaging of the head and neck was performed using the standard protocol during bolus  administration of intravenous contrast. Multiplanar CT image reconstructions and MIPs were obtained to evaluate the vascular anatomy. Carotid stenosis measurements (when applicable) are obtained utilizing NASCET criteria, using the distal internal carotid diameter as the denominator. CONTRAST:  75 cc Isovue 370. COMPARISON:  12/13/2016 head CT. FINDINGS: CT HEAD FINDINGS Brain: No intracranial hemorrhage or CT evidence of large acute infarct. No intracranial mass or enhancement.  No hydrocephalus. Vascular: As below Skull: No acute abnormality. Sinuses: Mucosal thickening left sphenoid sinus, maxillary sinuses bilaterally and opacification ethmoid sinus air cells bilaterally. Orbits: No acute abnormality. Review of the MIP images confirms the above findings CTA NECK FINDINGS Aortic arch: 3 vessel arch with mild plaque. Right carotid system: Slightly irregular appearance of the right carotid artery throughout its course with mild narrowing which may represent changes of inflammation from drug abuse or atherosclerotic changes. No evidence of dissection or high-grade stenosis. Left carotid system: Slightly irregular appearance of the left carotid artery throughout its course with mild narrowing which may represent changes of inflammation from drug abuse or atherosclerotic change. No evidence of dissection or high-grade stenosis. Vertebral arteries: Left vertebral artery is dominant. No high-grade stenosis of either vertebral artery. Skeleton: Prominent dental disease. Cervical spondylotic changes most notable C6-7. Other neck: Prominent palatine tonsils may indicate inflammation without discrete mass identified. Direct visualization would be necessary to exclude subtle mucosal abnormality. Scattered increased number of small lymph nodes possibly reactive in origin. Upper chest: No lung apical lesion. Review of the MIP images confirms the above findings CTA HEAD FINDINGS Anterior circulation: Mild irregularity of medium  and large size vessels of the anterior circulation without high-grade stenosis. Mild to moderate branch vessel irregularity. Posterior circulation: Moderate narrowing distal right vertebral artery. Mild irregularity distal left vertebral artery. Mild to moderate narrowing portions of the basilar artery. Posterior cerebral artery mild to moderate branch vessel irregularity and narrowing. Venous sinuses:  Patent. Anatomic variants: Fetal contribution to the right posterior cerebral artery. Delayed phase: As above. Review of the MIP images confirms the above findings IMPRESSION: CT HEAD No intracranial hemorrhage or CT evidence of large acute infarct. No intracranial mass or enhancement. Mucosal thickening left sphenoid sinus, maxillary sinuses bilaterally and opacification ethmoid sinus air cells bilaterally. CTA NECK Slightly irregular appearance of the carotid artery throughout its course bilaterally with mild narrowing which may represent changes of inflammation from drug abuse or atherosclerotic changes. No evidence of dissection or high-grade stenosis. Left vertebral artery is dominant. No high-grade stenosis of either vertebral artery. Prominent dental disease. Prominent palatine tonsils may indicate inflammation without discrete mass. Direct visualization would be necessary to exclude mucosa abnormality. Scattered increased number of small lymph nodes possibly reactive in origin. CTA HEAD Mild irregularity of medium and large size vessels of the anterior circulation without high-grade stenosis. Mild to mild branch vessel irregularity. Moderate narrowing distal right vertebral artery. Mild irregularity distal left vertebral artery. Mild to moderate narrowing portions of the basilar artery. Posterior cerebral artery mild to moderate branch vessel irregularity and narrowing. Findings may indicate result of vasculopathy related to patient's drug use or secondary to underlying atherosclerotic changes. Electronically  Signed   By: Lacy Duverney M.D.   On: 12/13/2016 22:16   Ct Head Wo Contrast  Result Date: 12/13/2016 CLINICAL DATA:  Pt woke up about 2 hrs ago and was blind had a headache He is able to see images now EXAM: CT HEAD WITHOUT CONTRAST TECHNIQUE: Contiguous axial images were obtained from the base of the skull through the vertex without intravenous contrast. COMPARISON:  None. FINDINGS: Brain: No mass lesion, hemorrhage, hydrocephalus, acute infarct, intra-axial, or extra-axial fluid collection. Vascular: No hyperdense vessel or unexpected calcification. Skull: Normal Sinuses/Orbits: Normal orbits and globes. Mucosal thickening of ethmoid air cells and left sphenoid sinus. Clear mastoid air cells. Other:  None IMPRESSION: 1.  No acute intracranial abnormality. 2. Sinus disease. Electronically Signed   By: Jeronimo Greaves M.D.   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 Dizziness. Injected herion this morning. Subsequent encounter. EXAM: CT ANGIOGRAPHY HEAD AND NECK TECHNIQUE: Multidetector CT imaging of the head and neck was performed using the standard protocol during bolus administration of intravenous contrast. Multiplanar CT image reconstructions and MIPs were obtained to evaluate the vascular anatomy. Carotid stenosis measurements (when applicable) are obtained utilizing NASCET criteria, using the distal internal carotid diameter as the denominator. CONTRAST:  75 cc Isovue 370. COMPARISON:  12/13/2016 head CT. FINDINGS: CT HEAD FINDINGS Brain: No intracranial hemorrhage or CT evidence of large acute infarct. No intracranial mass or enhancement.  No hydrocephalus. Vascular: As below Skull: No acute abnormality. Sinuses: Mucosal thickening left sphenoid sinus, maxillary sinuses bilaterally and opacification ethmoid sinus air cells  bilaterally. Orbits: No acute abnormality. Review of the MIP images confirms the above findings CTA NECK FINDINGS Aortic arch: 3 vessel arch with mild plaque. Right carotid system: Slightly irregular appearance of the right carotid artery throughout its course with mild narrowing which may represent changes of inflammation from drug abuse or atherosclerotic changes. No evidence of dissection or high-grade stenosis. Left carotid system: Slightly irregular appearance of the left carotid artery throughout its course with mild narrowing which may represent changes of inflammation from drug abuse or atherosclerotic change.  No evidence of dissection or high-grade stenosis. Vertebral arteries: Left vertebral artery is dominant. No high-grade stenosis of either vertebral artery. Skeleton: Prominent dental disease. Cervical spondylotic changes most notable C6-7. Other neck: Prominent palatine tonsils may indicate inflammation without discrete mass identified. Direct visualization would be necessary to exclude subtle mucosal abnormality. Scattered increased number of small lymph nodes possibly reactive in origin. Upper chest: No lung apical lesion. Review of the MIP images confirms the above findings CTA HEAD FINDINGS Anterior circulation: Mild irregularity of medium and large size vessels of the anterior circulation without high-grade stenosis. Mild to moderate branch vessel irregularity. Posterior circulation: Moderate narrowing distal right vertebral artery. Mild irregularity distal left vertebral artery. Mild to moderate narrowing portions of the basilar artery. Posterior cerebral artery mild to moderate branch vessel irregularity and narrowing. Venous sinuses: Patent. Anatomic variants: Fetal contribution to the right posterior cerebral artery. Delayed phase: As above. Review of the MIP images confirms the above findings IMPRESSION: CT HEAD No intracranial hemorrhage or CT evidence of large acute infarct. No intracranial  mass or enhancement. Mucosal thickening left sphenoid sinus, maxillary sinuses bilaterally and opacification ethmoid sinus air cells bilaterally. CTA NECK Slightly irregular appearance of the carotid artery throughout its course bilaterally with mild narrowing which may represent changes of inflammation from drug abuse or atherosclerotic changes. No evidence of dissection or high-grade stenosis. Left vertebral artery is dominant. No high-grade stenosis of either vertebral artery. Prominent dental disease. Prominent palatine tonsils may indicate inflammation without discrete mass. Direct visualization would be necessary to exclude mucosa abnormality. Scattered increased number of small lymph nodes possibly reactive in origin. CTA HEAD Mild irregularity of medium and large size vessels of the anterior circulation without high-grade stenosis. Mild to mild branch vessel irregularity. Moderate narrowing distal right vertebral artery. Mild irregularity distal left vertebral artery. Mild to moderate narrowing portions of the basilar artery. Posterior cerebral artery mild to moderate branch vessel irregularity and narrowing. Findings may indicate result of vasculopathy related to patient's drug use or secondary to underlying atherosclerotic changes. Electronically Signed   By: Lacy Duverney M.D.   On: 12/13/2016 22:16   Mr Laqueta Jean And Wo Contrast  Result Date: 12/14/2016 CLINICAL DATA:  Transient vision loss. Headache. Fever. Hairline abuse. EXAM: MRI HEAD WITHOUT AND WITH CONTRAST TECHNIQUE: Multiplanar, multiecho pulse sequences of the brain and surrounding structures were obtained without and with intravenous contrast. CONTRAST:  14mL MULTIHANCE GADOBENATE DIMEGLUMINE 529 MG/ML IV SOLN COMPARISON:  Head CT/ CTA 12/13/2016 FINDINGS: The study is mildly motion degraded. Brain: There is a 2 mm punctate focus of restricted diffusion involving cortex or subcortical white matter in the posterior left temporal lobe (series  3, image 23). Similar punctate foci of restricted diffusion are questioned in the right temporal lobe (series 3, image 23) and right parietal lobe (series 9, image 7). There is no evidence of intracranial hemorrhage, mass, midline shift, or extra-axial fluid collection. The ventricles and sulci are normal. No significant chronic ischemic changes are identified within limitations of motion artifact. No abnormal enhancement is identified. Vascular: Major intracranial vascular flow voids are preserved. Skull and upper cervical spine: Unremarkable bone marrow signal. Sinuses/Orbits: Unremarkable orbits. Mild mucosal thickening throughout the paranasal sinuses. Small right mastoid effusion. Other: None. IMPRESSION: 1. 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 2. Questionable additional tiny embolic infarcts versus artifact in the right temporal and right parietal lobes. Electronically Signed   By: Sebastian Ache M.D.   On: 12/14/2016 08:18  Dg Chest Portable 1 View  Result Date: 12/13/2016 CLINICAL DATA:  Shortness of breath and productive cough for 2 weeks, worsening. EXAM: PORTABLE CHEST 1 VIEW COMPARISON:  PA and lateral chest 11/04/2016. FINDINGS: There is new right lower lobe airspace disease. Left lung is clear. Heart size is normal. No pneumothorax or pleural effusion. Remote left rib fracture is noted. IMPRESSION: Right lower lobe airspace disease most consistent with pneumonia. Recommend followup to clearing. Electronically Signed   By: Drusilla Kanner M.D.   On: 12/13/2016 20:10     LOS: 5 days   Jeoffrey Massed, MD  [facility] Pager:[phone]  If 7PM-7AM, please contact night-coverage [email or web address] Password [facility] 12/18/2016, 2:58 PM

## 2016-12-18 NOTE — Progress Notes (Signed)
STROKE TEAM PROGRESS NOTE   HISTORY OF PRESENT ILLNESS (per record) Jesus KinScott Watkins is a 45 y.o. male who presented with vision loss after injecting heroin. Per chart review: "Earlier on 12/23, he took a hit of heroin and started having "[place] fevers" and nausea. He went to bed and woke up with vision loss bilaterally - he was only able to make out dark and light brightness but no visual acuity. His friend [facility] as his symptoms gradually resolved completely. 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 Since discharge, he has been living in a hotel and has continued to use heroin. This admission, labs showed leukocytosis, lactic acidosis and oliguria. He has been hypotensive, with SBPs in the 80s-90s.   His vision is subjectively back to normal. He currently is experiencing heroin withdrawal symptoms.   Patient was not administered IV t-PA secondary to delay in arrival. He was admitted for further evaluation and treatment.   SUBJECTIVE (INTERVAL HISTORY) The patient is lying comfortably in bed. He denies any neurological complaints.TEE is pending   OBJECTIVE Temp:  [97.6 F (36.4 C)-98.8 F (37.1 C)] 97.6 F (36.4 C) (12/28 0555) Pulse Rate:  [52-55] 52 (12/28 0555) Cardiac Rhythm: Normal sinus rhythm (12/27 1952) Resp:  [16] 16 (12/28 0555) BP: (95-112)/(55-57) 112/57 (12/28 0555) SpO2:  [96 %-99 %] 98 % (12/28 0555) Weight:  [69.8 kg (153 lb 14.1 oz)] 69.8 kg (153 lb 14.1 oz) (12/27 2102)  CBC:  Recent  Labs Lab 12/13/16 1716  12/15/16 0348 12/16/16 0536  WBC 14.7*  < > 27.4* 20.7*  NEUTROABS 14.3*  --   --  16.6*  HGB 11.1*  < > 9.0* 9.4*  HCT 34.9*  < > 28.1* 29.2*  MCV 85.7  < > 84.6 83.4  PLT 228  < > 185 241  < > = values in this interval not displayed.  Basic Metabolic Panel:   Recent Labs Lab 12/14/16 0528 12/15/16 0348 12/16/16 0536  NA 137 139 138  K 4.1 3.5 4.5  CL 106 106 107  CO2 20* 24 22  GLUCOSE 145* 88 96  BUN 23* 19 16  CREATININE 1.66* 1.21 1.14  CALCIUM 7.4* 8.1* 9.0  MG 1.4*  --  1.7  PHOS 4.0  --   --     Lipid Panel:     Component Value Date/Time   CHOL 134 12/17/2016 0629   TRIG 139 12/17/2016 0629   HDL 20 (L) 12/17/2016 0629   CHOLHDL 6.7 12/17/2016 0629   VLDL 28 12/17/2016 0629   LDLCALC 86 12/17/2016 0629   HgbA1c:  Lab Results  Component Value Date   HGBA1C 6.0 (H) 11/09/2016   Urine Drug Screen:     Component Value Date/Time   LABOPIA POSITIVE (A) 11/05/2016 1810   COCAINSCRNUR NONE DETECTED 11/05/2016 1810   LABBENZ POSITIVE (A) 11/05/2016 1810   AMPHETMU POSITIVE (A) 11/05/2016 1810   THCU NONE DETECTED 11/05/2016 1810   LABBARB NONE DETECTED 11/05/2016 1810  IMAGING  No results found. 2D echocardiogram - Left ventricle: The cavity size was normal. Wall thickness was increased in a pattern of moderate LVH. Systolic function was normal. The estimated ejection fraction was in the range of 55% to 60%. Wall motion was normal; there were no regional wall motion abnormalities. Doppler parameters are consistent with pseudonormal left ventricular relaxation (grade 1 diastolic dysfunction). The E/e&' ratio is around 15, suggesting borderline elevated LV filling pressure. - Mitral valve: Mildly thickened leaflets . There was mild regurgitation. - Left atrium: The atrium was normal in size. - Right ventricle: The cavity size was normal. Wall thickness was normal. Systolic function was normal. - Right atrium: The atrium was  normal in size. Mobile filamentous structure at the IVC/RA junction - suspicious for promient chiari network (embryologic variant), however, cannot r/o right-sided endocarditis. - Atrial septum: No defect or patent foramen ovale was identified. Impressions: - LVEF 55-60%, moderate LVH, normal wall motion, diastolic dysfunction, indeterminate LV filling pressure, mild MR, normal LA size, there is a filamentous structure at the RA/IVC junction which is suspicious for a chiari network (embryologic variant), however, I do not have a prior echo to compare to. I cannot exclude that this structure may be a vegetation in the setting of bacteremia. Consider TEE for further evaluation - which may be helpful if vegetations can be seen on the tricuspid and/or pulmonic valve as well.  PHYSICAL EXAM Frail cachectic middle-age Caucasian male  not in any distress. . Afebrile. Head is nontraumatic. Neck is supple without bruit.    Cardiac exam no murmur or gallop. Lungs are clear to auscultation. Distal pulses are well felt. Neurological Exam ;  Awake  Alert oriented x 3. Normal speech and language.eye movements full without nystagmus.fundi were not visualized. Vision acuity and fields appear normal. Hearing is normal. Palatal movements are normal. Face symmetric. Tongue midline. Normal strength, tone, reflexes and coordination. Normal sensation. Gait deferred.  ASSESSMENT/PLAN Mr. Jesus Watkins is a 45 y.o. male with history of IVDU with heroin presenting with bilateral vision loss. He did not receive IV t-PA due to delay in arrival.   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 Prominent detnal disease.  Prominent palatine tonsils without discrete mass. Increased scattered small LNs  2D Echo  EF 55-60%, normal LVH. Filamentous structure at RA/IVC jxn - either chiari network vs vegetation in setting of bacteremia  LDL ordered  TEE pending to rule out endocarditis  HgbA1c 6.0 in Nov  Lovenox 40 mg sq daily for VTE prophylaxis Diet regular Room service appropriate? Yes; Fluid consistency: Thin  No antithrombotic prior to admission, now on No antithrombotic add low dose aspirin for secondary stroke prevention per guidelines  Patient counseled to be compliant with his antithrombotic medications  Ongoing aggressive stroke risk factor management  Therapy recommendations:  pending   Disposition:  pending   Hypotension Resolved  Other Stroke Risk Factors  Heroin use  Other IV drug use  Cigarette smoker, advised to stop smoking  Other Active Problems  ARF, resolved  RLL PNA, ? Aspiration vs sepsis vs endocarditis - ID following  Recent compartment syndrome d/t drug use  Hospital day # 5  I have personally examined  this patient, reviewed notes, independently viewed imaging studies, participated in medical decision making and plan of care.ROS completed by me personally and pertinent positives fully documented  I have made any additions or clarifications directly to the above note. Continue aspirin for stroke prevention. And await TEE for endocarditis  given   history of cellulitis and iv drug abuse. Jesus Watkins.  Pramod Sethi, MD Medical Director [facility] Pager: [phone] 12/18/2016 9:40 AM   To contact Stroke Continuity provider, please refer to [email or web address]. After hours, contact General Neurology

## 2016-12-18 NOTE — Progress Notes (Signed)
INFECTIOUS DISEASE PROGRESS NOTE  ID: Jesus Watkins is a 45 y.o. male with  Active Problems:   Septic shock (HCC)   Severe sepsis (HCC)   Heroin withdrawal (HCC)   Heroin abuse   Sepsis due to pneumonia (HCC)   HCAP (healthcare-associated pneumonia)   Acute renal failure (HCC)   Infarction of left temporal lobe (HCC)  Subjective: Without complaints.   Abtx:  Anti-infectives    Start     Dose/Rate Route Frequency Ordered Stop   12/16/16 1000  vancomycin (VANCOCIN) IVPB 1000 mg/200 mL premix     1,000 mg 200 mL/hr over 60 Minutes Intravenous Every 12 hours 12/16/16 0930     12/14/16 0800  vancomycin (VANCOCIN) IVPB 1000 mg/200 mL premix  Status:  Discontinued     1,000 mg 200 mL/hr over 60 Minutes Intravenous Every 12 hours 12/13/16 2019 12/16/16 0930   12/14/16 0200  piperacillin-tazobactam (ZOSYN) IVPB 3.375 g     3.375 g 12.5 mL/hr over 240 Minutes Intravenous Every 8 hours 12/13/16 2019     12/13/16 2015  piperacillin-tazobactam (ZOSYN) IVPB 3.375 g     3.375 g 100 mL/hr over 30 Minutes Intravenous  Once 12/13/16 2007 12/13/16 2136   12/13/16 2015  vancomycin (VANCOCIN) IVPB 1000 mg/200 mL premix     1,000 mg 200 mL/hr over 60 Minutes Intravenous  Once 12/13/16 2007 12/13/16 2229      Medications:  Scheduled: . aspirin EC  81 mg Oral Daily  . cloNIDine  0.2 mg Transdermal Weekly  . enoxaparin (LOVENOX) injection  40 mg Subcutaneous Q24H  . mouth rinse  15 mL Mouth Rinse BID  . [START ON 12/19/2016] methadone  5 mg Oral Daily  . piperacillin-tazobactam (ZOSYN)  IV  3.375 g Intravenous Q8H  . vancomycin  1,000 mg Intravenous Q12H    Objective: Vital signs in last 24 hours: Temp:  [97.6 F (36.4 C)-98.8 F (37.1 C)] 98 F (36.7 C) (12/28 0849) Pulse Rate:  [52-60] 60 (12/28 0849) Resp:  [16-17] 17 (12/28 0849) BP: (95-112)/(52-57) 110/52 (12/28 0849) SpO2:  [98 %-99 %] 99 % (12/28 0849) Weight:  [69.8 kg (153 lb 14.1 oz)] 69.8 kg (153 lb 14.1 oz) (12/27  2102)   General appearance: alert, cooperative and no distress Resp: clear to auscultation bilaterally Cardio: regular rate and rhythm and systolic murmur: early systolic 5/6, crescendo throughout the precordium GI: normal findings: bowel sounds normal and soft, non-tender  Lab Results  Recent Labs  12/16/16 0536  WBC 20.7*  HGB 9.4*  HCT 29.2*  NA 138  K 4.5  CL 107  CO2 22  BUN 16  CREATININE 1.14   Liver Panel  Recent Labs  12/16/16 0536  PROT 6.4*  ALBUMIN 2.7*  AST 20  ALT 23  ALKPHOS 147*  BILITOT 0.4   Sedimentation Rate No results for input(s): ESRSEDRATE in the last 72 hours. C-Reactive Protein No results for input(s): CRP in the last 72 hours.  Microbiology: Recent Results (from the past 240 hour(s))  Blood culture (routine x 2)     Status: Abnormal (Preliminary result)   Collection Time: 12/13/16  7:20 PM  Result Value Ref Range Status   Specimen Description BLOOD RIGHT UPPER ARM  Final   Special Requests BOTTLES DRAWN AEROBIC AND ANAEROBIC 5CC EA  Final   Culture  Setup Time   Final    GRAM POSITIVE RODS ANAEROBIC BOTTLE ONLY Organism ID to follow CRITICAL RESULT CALLED TO, READ BACK BY AND VERIFIED  WITH: ADagoberto Ligas. Johnston Pharm.D. 15:00 12/17/16 ([email or web address])    Culture (A)  Final    BACILLUS SPECIES Standardized susceptibility testing for this organism is not available.    Report Status PENDING  Incomplete  Blood Culture ID Panel (Reflexed)     Status: None   Collection Time: 12/13/16  7:20 PM  Result Value Ref Range Status   Enterococcus species NOT DETECTED NOT DETECTED Final   Listeria monocytogenes NOT DETECTED NOT DETECTED Final   Staphylococcus species NOT DETECTED NOT DETECTED Final   Staphylococcus aureus NOT DETECTED NOT DETECTED Final   Streptococcus species NOT DETECTED NOT DETECTED Final   Streptococcus agalactiae NOT DETECTED NOT DETECTED Final   Streptococcus pneumoniae NOT DETECTED NOT DETECTED Final   Streptococcus pyogenes NOT  DETECTED NOT DETECTED Final   Acinetobacter baumannii NOT DETECTED NOT DETECTED Final   Enterobacteriaceae species NOT DETECTED NOT DETECTED Final   Enterobacter cloacae complex NOT DETECTED NOT DETECTED Final   Escherichia coli NOT DETECTED NOT DETECTED Final   Klebsiella oxytoca NOT DETECTED NOT DETECTED Final   Klebsiella pneumoniae NOT DETECTED NOT DETECTED Final   Proteus species NOT DETECTED NOT DETECTED Final   Serratia marcescens NOT DETECTED NOT DETECTED Final   Haemophilus influenzae NOT DETECTED NOT DETECTED Final   Neisseria meningitidis NOT DETECTED NOT DETECTED Final   Pseudomonas aeruginosa NOT DETECTED NOT DETECTED Final   Candida albicans NOT DETECTED NOT DETECTED Final   Candida glabrata NOT DETECTED NOT DETECTED Final   Candida krusei NOT DETECTED NOT DETECTED Final   Candida parapsilosis NOT DETECTED NOT DETECTED Final   Candida tropicalis NOT DETECTED NOT DETECTED Final  Blood culture (routine x 2)     Status: None (Preliminary result)   Collection Time: 12/13/16  7:30 PM  Result Value Ref Range Status   Specimen Description BLOOD LEFT HAND  Final   Special Requests BOTTLES DRAWN AEROBIC AND ANAEROBIC 5CC EA  Final   Culture  Setup Time   Final    GRAM POSITIVE RODS ANAEROBIC BOTTLE ONLY CRITICAL VALUE NOTED.  VALUE IS CONSISTENT WITH PREVIOUSLY REPORTED AND CALLED VALUE.    Culture GRAM POSITIVE RODS  Final   Report Status PENDING  Incomplete  Urine culture     Status: None   Collection Time: 12/13/16 11:10 PM  Result Value Ref Range Status   Specimen Description URINE, CATHETERIZED  Final   Special Requests NONE  Final   Culture NO GROWTH  Final   Report Status 12/15/2016 FINAL  Final  MRSA PCR Screening     Status: None   Collection Time: 12/14/16 12:53 AM  Result Value Ref Range Status   MRSA by PCR NEGATIVE NEGATIVE Final    Comment:        The GeneXpert MRSA Assay (FDA approved for NASAL specimens only), is one component of a comprehensive MRSA  colonization surveillance program. It is not intended to diagnose MRSA infection nor to guide or monitor treatment for MRSA infections.     Studies/Results: No results found.   Assessment/Plan: IVDA Embolic CVA ARF RLL PNA (? Aspiration)  His bcx are bacillus in 2 bottles of 2 different sets. This could be a contaminant, although bacillus has been seen in heroin users recently.  No change in anbx HIV (-) Hepatitis (-) Await TEE (he has a loud murmur)  Total days of antibiotics: 5 vanco/zosyn         Johny SaxJeffrey Ryatt Corsino Infectious Diseases (pager) [phone] [email or web address] 12/18/2016, 4:44 PM  LOS: 5 days

## 2016-12-19 ENCOUNTER — Inpatient Hospital Stay (HOSPITAL_COMMUNITY): Payer: Self-pay | Admitting: Certified Registered Nurse Anesthetist

## 2016-12-19 ENCOUNTER — Encounter (HOSPITAL_COMMUNITY)
Admission: EM | Disposition: A | Discharge status: Skilled Nursing Facility | Payer: Self-pay | Source: Home / Self Care | Attending: Internal Medicine

## 2016-12-19 ENCOUNTER — Encounter (HOSPITAL_COMMUNITY): Payer: Self-pay | Admitting: *Deleted

## 2016-12-19 ENCOUNTER — Inpatient Hospital Stay (HOSPITAL_COMMUNITY): Payer: Self-pay

## 2016-12-19 DIAGNOSIS — I38 Endocarditis, valve unspecified: Secondary | ICD-10-CM

## 2016-12-19 HISTORY — PX: TEE WITHOUT CARDIOVERSION: SHX5443

## 2016-12-19 LAB — CBC WITH DIFFERENTIAL/PLATELET
BASOS PCT: 1 %
Basophils Absolute: 0.1 10*3/uL (ref 0.0–0.1)
EOS PCT: 9 %
Eosinophils Absolute: 1 10*3/uL — ABNORMAL HIGH (ref 0.0–0.7)
HEMATOCRIT: 34.7 % — AB (ref 39.0–52.0)
Hemoglobin: 11.1 g/dL — ABNORMAL LOW (ref 13.0–17.0)
Lymphocytes Relative: 28 %
Lymphs Abs: 3.1 10*3/uL (ref 0.7–4.0)
MCH: 27.3 pg (ref 26.0–34.0)
MCHC: 32 g/dL (ref 30.0–36.0)
MCV: 85.5 fL (ref 78.0–100.0)
MONO ABS: 0.6 10*3/uL (ref 0.1–1.0)
MONOS PCT: 5 %
NEUTROS PCT: 57 %
Neutro Abs: 6.3 10*3/uL (ref 1.7–7.7)
Platelets: ADEQUATE 10*3/uL (ref 150–400)
RBC: 4.06 MIL/uL — AB (ref 4.22–5.81)
RDW: 14.6 % (ref 11.5–15.5)
WBC: 11.1 10*3/uL — AB (ref 4.0–10.5)

## 2016-12-19 LAB — BASIC METABOLIC PANEL
Anion gap: 11 (ref 5–15)
BUN: 18 mg/dL (ref 6–20)
CALCIUM: 9.2 mg/dL (ref 8.9–10.3)
CHLORIDE: 106 mmol/L (ref 101–111)
CO2: 23 mmol/L (ref 22–32)
CREATININE: 1.08 mg/dL (ref 0.61–1.24)
GFR calc Af Amer: >60 mL/min (ref >60)
GFR calc non Af Amer: >60 mL/min (ref >60)
GLUCOSE: 84 mg/dL (ref 65–99)
Potassium: 4.6 mmol/L (ref 3.5–5.1)
Sodium: 140 mmol/L (ref 135–145)

## 2016-12-19 LAB — CULTURE, BLOOD (ROUTINE X 2)

## 2016-12-19 SURGERY — ECHOCARDIOGRAM, TRANSESOPHAGEAL
Anesthesia: Monitor Anesthesia Care

## 2016-12-19 MED ORDER — PROPOFOL 500 MG/50ML IV EMUL
INTRAVENOUS | Status: DC | PRN
  Administered 2016-12-19: 175 ug/kg/min via INTRAVENOUS

## 2016-12-19 MED ORDER — PROPOFOL 10 MG/ML IV BOLUS
INTRAVENOUS | Status: DC | PRN
  Administered 2016-12-19: 30 mg via INTRAVENOUS

## 2016-12-19 MED ORDER — BUTAMBEN-TETRACAINE-BENZOCAINE 2-2-14 % EX AERO
INHALATION_SPRAY | CUTANEOUS | Status: DC | PRN
  Administered 2016-12-19: 2 via TOPICAL

## 2016-12-19 MED ORDER — SODIUM CHLORIDE 0.9 % IV SOLN
INTRAVENOUS | Status: DC
  Administered 2016-12-19: 10:00:00 via INTRAVENOUS

## 2016-12-19 NOTE — Progress Notes (Signed)
[facility]    Date of Admission:  12/13/2016   Total days of antibiotics 7        Day 7 vanco        Day 7 piptazo          ID: Jesus Watkins is a 45 y.o. male with hx of IVDU with sudden onset of vision loss found to have embolic CVA nad PNA on chest CT. Blood cx growing bacillus. Active Problems:   Septic shock (HCC)   Severe sepsis (HCC)   Heroin withdrawal (HCC)   Heroin abuse   Sepsis due to pneumonia (HCC)   HCAP (healthcare-associated pneumonia)   Acute renal failure (HCC)   Infarction of left temporal lobe (HCC)    Subjective: Afebrile. Underwent TEE which showed no vegetations to any valves. He reports that his symptoms of vision lost happened shortly after injection drug use. He had bilateral vision loss for roughly < 2hr but now back to his normal vision  He is interested in drug treatment program  Medications:  . aspirin EC  81 mg Oral Daily  . cloNIDine  0.2 mg Transdermal Weekly  . enoxaparin (LOVENOX) injection  40 mg Subcutaneous Q24H  . mouth rinse  15 mL Mouth Rinse BID  . methadone  5 mg Oral Daily  . piperacillin-tazobactam (ZOSYN)  IV  3.375 g Intravenous Q8H  . vancomycin  1,000 mg Intravenous Q12H    Objective: Vital signs in last 24 hours: Temp:  [98 F (36.7 C)-98.5 F (36.9 C)] 98.1 F (36.7 C) (12/29 1029) Pulse Rate:  [56-69] 57 (12/29 1040) Resp:  [14-18] 18 (12/29 1040) BP: (98-133)/(53-77) 111/60 (12/29 1040) SpO2:  [95 %-99 %] 97 % (12/29 1040) Weight:  [153 lb (69.4 kg)] 153 lb (69.4 kg) (12/28 2115) Physical Exam  Constitutional: He is oriented to person, place, and time. He appears well-developed and well-nourished. No distress.  HENT:  Mouth/Throat: Oropharynx is clear and moist. No oropharyngeal exudate.  Cardiovascular: Normal rate, regular rhythm and normal heart sounds. Exam reveals no gallop and no friction rub.  No murmur heard.  Pulmonary/Chest: Effort normal and breath sounds normal. No  respiratory distress. He has no wheezes.  Abdominal: Soft. Bowel sounds are normal. He exhibits no distension. There is no tenderness.  Lymphadenopathy:  He has no cervical adenopathy.  Neurological: He is alert and oriented to person, place, and time.  Skin: Skin is warm and dry. No rash noted. No erythema.  Psychiatric: He has a normal mood and affect. His behavior is normal.     Lab Results  Recent Labs  12/19/16 0918  NA 140  K 4.6  CL 106  CO2 23  BUN 18  CREATININE 1.08    Microbiology: 12/23 Blood cx in 3/4 bottles  Studies/Results: No results found.   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 Blood cx showing bacillus. TEE this morning showing no vegetations, possibly all have embolized. Currently on day 7 of abtx.  Will repeat blood cx to see he has cleared his bacteremia Check cbc to see leukocytosis improved  Would plan to treat as cns infection/endocarditis with 6 wk of vancomycin or other susceptible drugs. If he is not a candidate for snf, I would recommend to change him to either oral linezolid or oral levofloxacin which have good susceptibility for bacillus (though not 100% susceptibel)  Pneumonia = Can discontinue piptazo, which he finished 7 days and  keep on vancomycin for now  Active drug user = would have social worker see if there is any [facility] at [facility] for the patient  [name], [facility] Cell: [phone] Pager: [phone]  12/19/2016, 12:28 PM

## 2016-12-19 NOTE — Transfer of Care (Signed)
Immediate Anesthesia Transfer of Care Note  Patient: Jesus Watkins  Procedure(s) Performed: Procedure(s): TRANSESOPHAGEAL ECHOCARDIOGRAM (TEE) (N/A)  Patient Location: PACU and Endoscopy Unit  Anesthesia Type:MAC  Level of Consciousness: awake, alert , oriented and patient cooperative  Airway & Oxygen Therapy: Patient Spontanous Breathing and Patient connected to nasal cannula oxygen  Post-op Assessment: Report given to RN and Post -op Vital signs reviewed and stable  Post vital signs: Reviewed and stable  Last Vitals:  Vitals:   12/19/16 0943 12/19/16 1029  BP: (!) 108/57 133/77  Pulse: (!) 56 69  Resp: 14 15  Temp: 36.7 C     Last Pain:  Vitals:   12/19/16 0943  TempSrc: Oral  PainSc:          Complications: No apparent anesthesia complications

## 2016-12-19 NOTE — Anesthesia Preprocedure Evaluation (Addendum)
Anesthesia Evaluation  Patient identified by MRN, date of birth, ID band Patient awake    Reviewed: Allergy & Precautions, NPO status , Patient's Chart, lab work & pertinent test results  Airway Mallampati: II  TM Distance: >3 FB Neck ROM: Full    Dental  (+) Dental Advisory Given, Missing, Teeth Intact   Pulmonary Current Smoker,    Pulmonary exam normal breath sounds clear to auscultation       Cardiovascular hypertension, Pt. on medications Normal cardiovascular exam Rhythm:Regular Rate:Normal     Neuro/Psych negative neurological ROS  negative psych ROS   GI/Hepatic GERD  ,(+)     substance abuse  IV drug use,   Endo/Other  negative endocrine ROS  Renal/GU negative Renal ROS  negative genitourinary   Musculoskeletal Chronic pain    Abdominal   Peds negative pediatric ROS (+)  Hematology  (+) Blood dyscrasia, anemia ,   Anesthesia Other Findings   Reproductive/Obstetrics negative OB ROS                            Lab Results  Component Value Date   WBC 20.7 (H) 12/16/2016   HGB 9.4 (L) 12/16/2016   HCT 29.2 (L) 12/16/2016   MCV 83.4 12/16/2016   PLT 241 12/16/2016   Lab Results  Component Value Date   CREATININE 1.14 12/16/2016   BUN 16 12/16/2016   NA 138 12/16/2016   K 4.5 12/16/2016   CL 107 12/16/2016   CO2 22 12/16/2016    Anesthesia Physical  Anesthesia Plan  ASA: III  Anesthesia Plan: MAC   Post-op Pain Management:    Induction: Intravenous  Airway Management Planned: Nasal Cannula  Additional Equipment:   Intra-op Plan:   Post-operative Plan:   Informed Consent: I have reviewed the patients History and Physical, chart, labs and discussed the procedure including the risks, benefits and alternatives for the proposed anesthesia with the patient or authorized representative who has indicated his/her understanding and acceptance.   Dental advisory  given  Plan Discussed with: CRNA and Surgeon  Anesthesia Plan Comments: (Discussed risks/benefits/alternatives to MAC sedation including need for ventilatory support, hypotension, need for conversion to general anesthesia.  All patient questions answered.  Patient/guardian wishes to proceed.)        Anesthesia Quick Evaluation

## 2016-12-19 NOTE — Progress Notes (Signed)
PROGRESS NOTE        PATIENT DETAILS Name: Jesus Watkins Age: 45 y.o. Sex: male Date of Birth: April 16, 1971 Admit Date: 12/13/2016 Admitting Physician Oretha Milch, MD PCP:No PCP Per Patient  Brief Narrative: Patient is a 45 y.o. 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 See below for further details  Subjective: Visual loss has resolved-he thinks his vision is back to his baseline.  Assessment/Plan: Severe sepsis: Sepsis pathophysiology has resolved. Etiology thought to be either pneumonia or underlying endocarditis.2 sets of blood cultures are positive for bacilus species, given history of IVDA-not sure this is a contaminant.TEE 12/29 negative. Continue empiric antibiotics, await further recommendations from ID.  Healthcare associated pneumonia: Non toxic-appearing, leukocytosis has improved. Remains on empiric antimicrobial therapy.   Acute left temporal lobe infarct, questionable right temporal/parietal lobe infarcts: Thankfully visual loss/blurry vision changes have resolved. CVA thought to be embolic in nature- suspicion for underlying endocarditis given IV drug use. CTA of the neck did not show any significant stenosis/dissection. 2-D echocardiogram with EF around 55-60%, filamentous structure at RA/IVC junction-thought to be either a vegetation or a Chiari network. TEE on 12/29 did not show any vegetations, no further recommendations from stroke team.  LDL 86, recent A1c in November was 6.0.  Heroin abuse/withdrawal: No signs of withdrawal at present- slowly continue to taper down methadone to discontinue in the next few days. Have asked social worker to evaluate for outpatient substance abuse rehabilitation/resources.   Acute kidney injury: Resolved, likely due to sepsis/hypotension.  Homelessness:  Social work evaluation prior to discharge  DVT Prophylaxis: Prophylactic Lovenox  Code Status: Full code   Family Communication: None at bedside  Disposition Plan: Remain inpatient-await infectious disease evaluation-if thought to require IV antibiotic-will require SNF placement. Patient is agreeable.  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  TEE 12/29>>Negative for vegetations  CONSULTS:  pulmonary/intensive care, ID and neurology  Time spent: 25 minutes-Greater than 50% of this time was spent in counseling, explanation of diagnosis, planning of further management, and coordination of care.  MEDICATIONS: Anti-infectives    Start     Dose/Rate Route Frequency Ordered Stop   12/16/16 1000  vancomycin (VANCOCIN) IVPB 1000 mg/200 mL premix     1,000 mg 200 mL/hr over 60 Minutes Intravenous Every 12 hours 12/16/16 0930     12/14/16 0800  vancomycin (VANCOCIN) IVPB 1000 mg/200 mL premix  Status:  Discontinued     1,000 mg 200 mL/hr over 60 Minutes Intravenous Every 12 hours 12/13/16 2019 12/16/16 0930   12/14/16 0200  piperacillin-tazobactam (ZOSYN) IVPB 3.375 g     3.375 g 12.5 mL/hr over 240 Minutes Intravenous Every 8 hours 12/13/16 2019     12/13/16 2015  piperacillin-tazobactam (ZOSYN) IVPB 3.375 g     3.375 g 100 mL/hr over 30 Minutes Intravenous  Once 12/13/16 2007 12/13/16 2136   12/13/16 2015  vancomycin (VANCOCIN) IVPB 1000 mg/200 mL premix     1,000 mg 200 mL/hr over 60 Minutes Intravenous  Once 12/13/16 2007 12/13/16 2229      Scheduled Meds: . aspirin EC  81 mg Oral Daily  . cloNIDine  0.2 mg Transdermal Weekly  . enoxaparin (LOVENOX) injection  40 mg Subcutaneous Q24H  . mouth rinse  15 mL Mouth Rinse BID  . methadone  5 mg Oral Daily  . piperacillin-tazobactam  (ZOSYN)  IV  3.375 g Intravenous Q8H  . vancomycin  1,000 mg Intravenous Q12H   Continuous Infusions: PRN Meds:.LORazepam, ondansetron (ZOFRAN) IV   PHYSICAL EXAM: Vital signs: Vitals:   12/19/16 0551 12/19/16 0943 12/19/16 1029 12/19/16 1040  BP: (!) 110/54 (!) 108/57 133/77 111/60  Pulse: 60 (!) 56 69 (!) 57  Resp: 17 14 15 18   Temp: 98.2 F (36.8 C) 98.1 F (36.7 C) 98.1 F (36.7 C)   TempSrc: Oral Oral Oral   SpO2: 99% 95% 99% 97%  Weight:      Height:       Filed Weights   12/16/16 2028 12/17/16 2102 12/18/16 2115  Weight: 69.5 kg (153 lb 3.5 oz) 69.8 kg (153 lb 14.1 oz) 69.4 kg (153 lb)   Body mass index is 23.26 kg/m.   General appearance :Awake, alert, not in any distress. Speech Clear. Not toxic Looking Eyes:, pupils equally reactive to light and accomodation,no scleral icterus.Pink conjunctiva HEENT: Atraumatic and Normocephalic Neck: supple, no JVD. No cervical lymphadenopathy. No thyromegaly Resp:Good air entry bilaterally, no added sounds  CVS: S1 S2 regular, no murmurs.  GI: Bowel sounds present, Non tender and not distended with no gaurding, rigidity or rebound.No organomegaly Extremities: B/L Lower Ext shows no edema, both legs are warm to touch Neurology:  speech clear,Non focal, sensation is grossly intact. Psychiatric: Normal judgment and insight. Alert and oriented x 3. Normal mood. Musculoskeletal:gait appears to be normal.No digital cyanosis Skin:No Rash, warm and dry Wounds:N/A  I have personally reviewed following labs and imaging studies  LABORATORY DATA: CBC:  Recent Labs Lab 12/13/16 1716 12/13/16 1735 12/14/16 0528 12/15/16 0348 12/16/16 0536 12/19/16 1326  WBC 14.7*  --  28.4* 27.4* 20.7* 11.1*  NEUTROABS 14.3*  --   --   --  16.6* 6.3  HGB 11.1* 11.2* 9.8* 9.0* 9.4* 11.1*  HCT 34.9* 33.0* 30.8* 28.1* 29.2* 34.7*  MCV 85.7  --  83.9 84.6 83.4 85.5  PLT 228  --  170 185 241 PLATELET CLUMPS NOTED ON SMEAR, COUNT APPEARS ADEQUATE     Basic Metabolic Panel:  Recent Labs Lab 12/13/16 1716 12/13/16 1735 12/14/16 0528 12/15/16 0348 12/16/16 0536 12/19/16 0918  NA 135 135 137 139 138 140  K 4.1 4.1 4.1 3.5 4.5 4.6  CL 99* 99* 106 106 107 106  CO2 22  --  20* 24 22 23   GLUCOSE 158* 161* 145* 88 96 84  BUN 21* 22* 23* 19 16 18   CREATININE 1.74* 1.40* 1.66* 1.21 1.14 1.08  CALCIUM 8.9  --  7.4* 8.1* 9.0 9.2  MG  --   --  1.4*  --  1.7  --   PHOS  --   --  4.0  --   --   --     GFR: Estimated Creatinine Clearance: 84.4 mL/min (by C-G formula based on SCr of 1.08 mg/dL).  Liver Function Tests:  Recent Labs Lab 12/13/16 1716 12/16/16 0536  AST 120* 20  ALT 39 23  ALKPHOS 235* 147*  BILITOT 0.8 0.4  PROT 6.6 6.4*  ALBUMIN 3.2* 2.7*   No results for input(s): LIPASE, AMYLASE in the last 168 hours. No results for input(s): AMMONIA in the last 168  hours.  Coagulation Profile:  Recent Labs Lab 12/13/16 1716  INR 1.25    Cardiac Enzymes:  Recent Labs Lab 12/15/16 1052 12/15/16 1630 12/15/16 2212  TROPONINI <0.03 <0.03 <0.03    BNP (last 3 results) No results for input(s): PROBNP in the last 8760 hours.  HbA1C: No results for input(s): HGBA1C in the last 72 hours.  CBG: No results for input(s): GLUCAP in the last 168 hours.  Lipid Profile:  Recent Labs  12/17/16 0629  CHOL 134  HDL 20*  LDLCALC 86  TRIG 161  CHOLHDL 6.7    Thyroid Function Tests: No results for input(s): TSH, T4TOTAL, FREET4, T3FREE, THYROIDAB in the last 72 hours.  Anemia Panel: No results for input(s): VITAMINB12, FOLATE, FERRITIN, TIBC, IRON, RETICCTPCT in the last 72 hours.  Urine analysis:    Component Value Date/Time   COLORURINE YELLOW 12/13/2016 2310   APPEARANCEUR HAZY (A) 12/13/2016 2310   LABSPEC 1.034 (H) 12/13/2016 2310   PHURINE 5.0 12/13/2016 2310   GLUCOSEU NEGATIVE 12/13/2016 2310   HGBUR SMALL (A) 12/13/2016 2310   BILIRUBINUR NEGATIVE 12/13/2016 2310   KETONESUR NEGATIVE  12/13/2016 2310   PROTEINUR 100 (A) 12/13/2016 2310   NITRITE NEGATIVE 12/13/2016 2310   LEUKOCYTESUR NEGATIVE 12/13/2016 2310    Sepsis Labs: Lactic Acid, Venous    Component Value Date/Time   LATICACIDVEN 1.1 12/15/2016 1000    MICROBIOLOGY: Recent Results (from the past 240 hour(s))  Blood culture (routine x 2)     Status: Abnormal   Collection Time: 12/13/16  7:20 PM  Result Value Ref Range Status   Specimen Description BLOOD RIGHT UPPER ARM  Final   Special Requests BOTTLES DRAWN AEROBIC AND ANAEROBIC 5CC EA  Final   Culture  Setup Time   Final    GRAM POSITIVE RODS ANAEROBIC BOTTLE ONLY CRITICAL RESULT CALLED TO, READ BACK BY AND VERIFIED WITH: ADagoberto Ligas.D. 15:00 12/17/16 ([email or web address])    Culture (A)  Final    BACILLUS SPECIES Standardized susceptibility testing for this organism is not available.    Report Status 12/19/2016 FINAL  Final  Blood Culture ID Panel (Reflexed)     Status: None   Collection Time: 12/13/16  7:20 PM  Result Value Ref Range Status   Enterococcus species NOT DETECTED NOT DETECTED Final   Listeria monocytogenes NOT DETECTED NOT DETECTED Final   Staphylococcus species NOT DETECTED NOT DETECTED Final   Staphylococcus aureus NOT DETECTED NOT DETECTED Final   Streptococcus species NOT DETECTED NOT DETECTED Final   Streptococcus agalactiae NOT DETECTED NOT DETECTED Final   Streptococcus pneumoniae NOT DETECTED NOT DETECTED Final   Streptococcus pyogenes NOT DETECTED NOT DETECTED Final   Acinetobacter baumannii NOT DETECTED NOT DETECTED Final   Enterobacteriaceae species NOT DETECTED NOT DETECTED Final   Enterobacter cloacae complex NOT DETECTED NOT DETECTED Final   Escherichia coli NOT DETECTED NOT DETECTED Final   Klebsiella oxytoca NOT DETECTED NOT DETECTED Final   Klebsiella pneumoniae NOT DETECTED NOT DETECTED Final   Proteus species NOT DETECTED NOT DETECTED Final   Serratia marcescens NOT DETECTED NOT DETECTED Final   Haemophilus  influenzae NOT DETECTED NOT DETECTED Final   Neisseria meningitidis NOT DETECTED NOT DETECTED Final   Pseudomonas aeruginosa NOT DETECTED NOT DETECTED Final   Candida albicans NOT DETECTED NOT DETECTED Final   Candida glabrata NOT DETECTED NOT DETECTED Final   Candida krusei NOT DETECTED NOT DETECTED Final   Candida parapsilosis NOT DETECTED NOT DETECTED Final   Candida tropicalis NOT  DETECTED NOT DETECTED Final  Blood culture (routine x 2)     Status: Abnormal (Preliminary result)   Collection Time: 12/13/16  7:30 PM  Result Value Ref Range Status   Specimen Description BLOOD LEFT HAND  Final   Special Requests BOTTLES DRAWN AEROBIC AND ANAEROBIC 5CC EA  Final   Culture  Setup Time   Final    GRAM POSITIVE RODS ANAEROBIC BOTTLE ONLY CRITICAL VALUE NOTED.  VALUE IS CONSISTENT WITH PREVIOUSLY REPORTED AND CALLED VALUE.    Culture (A)  Final    BACILLUS SPECIES Standardized susceptibility testing for this organism is not available.    Report Status PENDING  Incomplete  Urine culture     Status: None   Collection Time: 12/13/16 11:10 PM  Result Value Ref Range Status   Specimen Description URINE, CATHETERIZED  Final   Special Requests NONE  Final   Culture NO GROWTH  Final   Report Status 12/15/2016 FINAL  Final  MRSA PCR Screening     Status: None   Collection Time: 12/14/16 12:53 AM  Result Value Ref Range Status   MRSA by PCR NEGATIVE NEGATIVE Final    Comment:        The GeneXpert MRSA Assay (FDA approved for NASAL specimens only), is one component of a comprehensive MRSA colonization surveillance program. It is not intended to diagnose MRSA infection nor to guide or monitor treatment for MRSA infections.     RADIOLOGY STUDIES/RESULTS: Ct Angio Head W Or Wo Contrast  Result Date: 12/13/2016 CLINICAL DATA:  45 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 Dizziness. Injected herion this morning.  Subsequent encounter. EXAM: CT ANGIOGRAPHY HEAD AND NECK TECHNIQUE: Multidetector CT imaging of the head and neck was performed using the standard protocol during bolus administration of intravenous contrast. Multiplanar CT image reconstructions and MIPs were obtained to evaluate the vascular anatomy. Carotid stenosis measurements (when applicable) are obtained utilizing NASCET criteria, using the distal internal carotid diameter as the denominator. CONTRAST:  75 cc Isovue 370. COMPARISON:  12/13/2016 head CT. FINDINGS: CT HEAD FINDINGS Brain: No intracranial hemorrhage or CT evidence of large acute infarct. No intracranial mass or enhancement.  No hydrocephalus. Vascular: As below Skull: No acute abnormality. Sinuses: Mucosal thickening left sphenoid sinus, maxillary sinuses bilaterally and opacification ethmoid sinus air cells bilaterally. Orbits: No acute abnormality. Review of the MIP images confirms the above findings CTA NECK FINDINGS Aortic arch: 3 vessel arch with mild plaque. Right carotid system: Slightly irregular appearance of the right carotid artery throughout its course with mild narrowing which may represent changes of inflammation from drug abuse or atherosclerotic changes. No evidence of dissection or high-grade stenosis. Left carotid system: Slightly irregular appearance of the left carotid artery throughout its course with mild narrowing which may represent changes of inflammation from drug abuse or atherosclerotic change. No evidence of dissection or high-grade stenosis. Vertebral arteries: Left vertebral artery is dominant. No high-grade stenosis of either vertebral artery. Skeleton: Prominent dental disease. Cervical spondylotic changes most notable C6-7. Other neck: Prominent palatine tonsils may indicate inflammation without discrete mass identified. Direct visualization would be necessary to exclude subtle mucosal abnormality. Scattered increased number of small lymph nodes possibly  reactive in origin. Upper chest: No lung apical lesion. Review of the MIP images confirms the above findings CTA HEAD FINDINGS Anterior circulation: Mild irregularity of medium and large size vessels of the anterior circulation without high-grade stenosis. Mild to  moderate branch vessel irregularity. Posterior circulation: Moderate narrowing distal right vertebral artery. Mild irregularity distal left vertebral artery. Mild to moderate narrowing portions of the basilar artery. Posterior cerebral artery mild to moderate branch vessel irregularity and narrowing. Venous sinuses: Patent. Anatomic variants: Fetal contribution to the right posterior cerebral artery. Delayed phase: As above. Review of the MIP images confirms the above findings IMPRESSION: CT HEAD No intracranial hemorrhage or CT evidence of large acute infarct. No intracranial mass or enhancement. Mucosal thickening left sphenoid sinus, maxillary sinuses bilaterally and opacification ethmoid sinus air cells bilaterally. CTA NECK Slightly irregular appearance of the carotid artery throughout its course bilaterally with mild narrowing which may represent changes of inflammation from drug abuse or atherosclerotic changes. No evidence of dissection or high-grade stenosis. Left vertebral artery is dominant. No high-grade stenosis of either vertebral artery. Prominent dental disease. Prominent palatine tonsils may indicate inflammation without discrete mass. Direct visualization would be necessary to exclude mucosa abnormality. Scattered increased number of small lymph nodes possibly reactive in origin. CTA HEAD Mild irregularity of medium and large size vessels of the anterior circulation without high-grade stenosis. Mild to mild branch vessel irregularity. Moderate narrowing distal right vertebral artery. Mild irregularity distal left vertebral artery. Mild to moderate narrowing portions of the basilar artery. Posterior cerebral artery mild to moderate branch  vessel irregularity and narrowing. Findings may indicate result of vasculopathy related to patient's drug use or secondary to underlying atherosclerotic changes. Electronically Signed   By: Lacy DuverneySteven  Olson M.D.   On: 12/13/2016 22:16   Ct Head Wo Contrast  Result Date: 12/13/2016 CLINICAL DATA:  Pt woke up about 2 hrs ago and was blind had a headache He is able to see images now EXAM: CT HEAD WITHOUT CONTRAST TECHNIQUE: Contiguous axial images were obtained from the base of the skull through the vertex without intravenous contrast. COMPARISON:  None. FINDINGS: Brain: No mass lesion, hemorrhage, hydrocephalus, acute infarct, intra-axial, or extra-axial fluid collection. Vascular: No hyperdense vessel or unexpected calcification. Skull: Normal Sinuses/Orbits: Normal orbits and globes. Mucosal thickening of ethmoid air cells and left sphenoid sinus. Clear mastoid air cells. Other:  None IMPRESSION: 1.  No acute intracranial abnormality. 2. Sinus disease. Electronically Signed   By: Jeronimo GreavesKyle  Talbot M.D.   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 Dizziness. Injected herion this morning. Subsequent encounter. EXAM: CT ANGIOGRAPHY HEAD AND NECK TECHNIQUE: Multidetector CT imaging of the head and neck was performed using the standard protocol during bolus administration of intravenous contrast. Multiplanar CT image reconstructions and MIPs were obtained to evaluate the vascular anatomy. Carotid stenosis measurements (when applicable) are obtained utilizing NASCET criteria, using the distal internal carotid diameter as the denominator. CONTRAST:  75 cc Isovue 370. COMPARISON:  12/13/2016 head CT. FINDINGS: CT HEAD FINDINGS Brain: No intracranial hemorrhage or CT evidence of large acute infarct. No intracranial mass or enhancement.  No hydrocephalus.  Vascular: As below Skull: No acute abnormality. Sinuses: Mucosal thickening left sphenoid sinus, maxillary sinuses bilaterally and opacification ethmoid sinus air cells bilaterally. Orbits: No acute abnormality. Review of the MIP images confirms the above findings CTA NECK FINDINGS Aortic arch: 3 vessel arch with mild plaque. Right carotid system: Slightly irregular appearance of the right carotid artery throughout its course with mild narrowing which may represent changes of inflammation from drug  abuse or atherosclerotic changes. No evidence of dissection or high-grade stenosis. Left carotid system: Slightly irregular appearance of the left carotid artery throughout its course with mild narrowing which may represent changes of inflammation from drug abuse or atherosclerotic change. No evidence of dissection or high-grade stenosis. Vertebral arteries: Left vertebral artery is dominant. No high-grade stenosis of either vertebral artery. Skeleton: Prominent dental disease. Cervical spondylotic changes most notable C6-7. Other neck: Prominent palatine tonsils may indicate inflammation without discrete mass identified. Direct visualization would be necessary to exclude subtle mucosal abnormality. Scattered increased number of small lymph nodes possibly reactive in origin. Upper chest: No lung apical lesion. Review of the MIP images confirms the above findings CTA HEAD FINDINGS Anterior circulation: Mild irregularity of medium and large size vessels of the anterior circulation without high-grade stenosis. Mild to moderate branch vessel irregularity. Posterior circulation: Moderate narrowing distal right vertebral artery. Mild irregularity distal left vertebral artery. Mild to moderate narrowing portions of the basilar artery. Posterior cerebral artery mild to moderate branch vessel irregularity and narrowing. Venous sinuses: Patent. Anatomic variants: Fetal contribution to the right posterior cerebral artery. Delayed  phase: As above. Review of the MIP images confirms the above findings IMPRESSION: CT HEAD No intracranial hemorrhage or CT evidence of large acute infarct. No intracranial mass or enhancement. Mucosal thickening left sphenoid sinus, maxillary sinuses bilaterally and opacification ethmoid sinus air cells bilaterally. CTA NECK Slightly irregular appearance of the carotid artery throughout its course bilaterally with mild narrowing which may represent changes of inflammation from drug abuse or atherosclerotic changes. No evidence of dissection or high-grade stenosis. Left vertebral artery is dominant. No high-grade stenosis of either vertebral artery. Prominent dental disease. Prominent palatine tonsils may indicate inflammation without discrete mass. Direct visualization would be necessary to exclude mucosa abnormality. Scattered increased number of small lymph nodes possibly reactive in origin. CTA HEAD Mild irregularity of medium and large size vessels of the anterior circulation without high-grade stenosis. Mild to mild branch vessel irregularity. Moderate narrowing distal right vertebral artery. Mild irregularity distal left vertebral artery. Mild to moderate narrowing portions of the basilar artery. Posterior cerebral artery mild to moderate branch vessel irregularity and narrowing. Findings may indicate result of vasculopathy related to patient's drug use or secondary to underlying atherosclerotic changes. Electronically Signed   By: Lacy DuverneySteven  Olson M.D.   On: 12/13/2016 22:16   Mr Laqueta JeanBrain W And Wo Contrast  Result Date: 12/14/2016 CLINICAL DATA:  Transient vision loss. Headache. Fever. Hairline abuse. EXAM: MRI HEAD WITHOUT AND WITH CONTRAST TECHNIQUE: Multiplanar, multiecho pulse sequences of the brain and surrounding structures were obtained without and with intravenous contrast. CONTRAST:  14mL MULTIHANCE GADOBENATE DIMEGLUMINE 529 MG/ML IV SOLN COMPARISON:  Head CT/ CTA 12/13/2016 FINDINGS: The study is  mildly motion degraded. Brain: There is a 2 mm punctate focus of restricted diffusion involving cortex or subcortical white matter in the posterior left temporal lobe (series 3, image 23). Similar punctate foci of restricted diffusion are questioned in the right temporal lobe (series 3, image 23) and right parietal lobe (series 9, image 7). There is no evidence of intracranial hemorrhage, mass, midline shift, or extra-axial fluid collection. The ventricles and sulci are normal. No significant chronic ischemic changes are identified within limitations of motion artifact. No abnormal enhancement is identified. Vascular: Major intracranial vascular flow voids are preserved. Skull and upper cervical spine: Unremarkable bone marrow signal. Sinuses/Orbits: Unremarkable orbits. Mild mucosal thickening throughout the paranasal sinuses. Small right mastoid effusion. Other: None. IMPRESSION: 1. Punctate  acute embolic infarct in the left temporal lobe. 2. Questionable additional tiny embolic infarcts versus artifact in the right temporal and right parietal lobes. Electronically Signed   By: Sebastian Ache M.D.   On: 12/14/2016 08:18   Dg Chest Portable 1 View  Result Date: 12/13/2016 CLINICAL DATA:  Shortness of breath and productive cough for 2 weeks, worsening. EXAM: PORTABLE CHEST 1 VIEW COMPARISON:  PA and lateral chest 11/04/2016. FINDINGS: There is new right lower lobe airspace disease. Left lung is clear. Heart size is normal. No pneumothorax or pleural effusion. Remote left rib fracture is noted. IMPRESSION: Right lower lobe airspace disease most consistent with pneumonia. Recommend followup to clearing. Electronically Signed   By: Drusilla Kanner M.D.   On: 12/13/2016 20:10     LOS: 6 days   Jeoffrey Massed, MD  [facility] Pager:[phone]  If 7PM-7AM, please contact night-coverage [email or web address] Password [email or web address] 12/19/2016, 3:10 PM

## 2016-12-19 NOTE — Progress Notes (Signed)
STROKE TEAM PROGRESS NOTE   HISTORY OF PRESENT ILLNESS (per record) Jesus Watkins is a 45 y.o. male who presented with vision loss after injecting heroin. Per chart review: "Earlier on 12/23, he took a hit of heroin and started having "[place] fevers" and nausea. He went to bed and woke up with vision loss bilaterally - he was only able to make out dark and light brightness but no visual acuity. [facility] as his symptoms gradually resolved completely. 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 Since discharge, he has been living in a hotel and has continued to use heroin. This admission, labs showed leukocytosis, lactic acidosis and oliguria. He has been hypotensive, with SBPs in the 80s-90s.   His vision is subjectively back to normal. He currently is experiencing heroin withdrawal symptoms.   Patient was not administered IV t-PA secondary to delay in arrival. He was admitted for further evaluation and treatment.   SUBJECTIVE (INTERVAL HISTORY) The patient is lying comfortably in bed. He denies any neurological complaints.TEE  Shows no evidence of endocarditis   OBJECTIVE Temp:  [98 F (36.7 C)-98.5 F (36.9 C)] 98.1 F (36.7 C) (12/29 1029) Pulse Rate:  [56-69] 57 (12/29 1040) Cardiac Rhythm: Sinus bradycardia (12/29 0700) Resp:  [14-18] 18 (12/29 1040) BP: (98-133)/(53-77) 111/60 (12/29 1040) SpO2:  [95 %-99 %] 97 % (12/29 1040) Weight:  [69.4 kg (153 lb)] 69.4 kg (153 lb) (12/28 2115)  CBC:   Recent Labs Lab 12/13/16 1716  12/15/16 0348 12/16/16 0536  WBC 14.7*  < > 27.4* 20.7*  NEUTROABS 14.3*  --   --  16.6*  HGB 11.1*  < > 9.0* 9.4*  HCT 34.9*  < > 28.1* 29.2*  MCV 85.7  < > 84.6 83.4  PLT 228  < > 185 241  < > = values in this interval not displayed.  Basic Metabolic Panel:   Recent Labs Lab 12/14/16 0528  12/16/16 0536 12/19/16 0918  NA 137  < > 138 140  K 4.1  < > 4.5 4.6  CL 106  < > 107 106  CO2 20*  < > 22 23  GLUCOSE 145*  < > 96 84  BUN 23*  < > 16 18  CREATININE 1.66*  < > 1.14 1.08  CALCIUM 7.4*  < > 9.0 9.2  MG 1.4*  --  1.7  --   PHOS 4.0  --   --   --   < > = values in this interval not displayed.  Lipid Panel:     Component Value Date/Time   CHOL 134 12/17/2016 0629   TRIG 139 12/17/2016 0629   HDL 20 (L) 12/17/2016 0629   CHOLHDL 6.7 12/17/2016 0629   VLDL 28 12/17/2016 0629   LDLCALC 86 12/17/2016 0629   HgbA1c:  Lab Results  Component Value Date   HGBA1C 6.0 (H) 11/09/2016   Urine Drug Screen:     Component Value Date/Time   LABOPIA POSITIVE (A) 11/05/2016 1810  COCAINSCRNUR NONE DETECTED 11/05/2016 1810   LABBENZ POSITIVE (A) 11/05/2016 1810   AMPHETMU POSITIVE (A) 11/05/2016 1810   THCU NONE DETECTED 11/05/2016 1810   LABBARB NONE DETECTED 11/05/2016 1810      IMAGING  No results found.  TEE Normal TEE. No vegetations seen. No intracardiac thrombi/masses. No ASD/PFO. Moderate atherosclerotic plaque is seen, but is located in the distal arch and descending aorta.   PHYSICAL EXAM Frail cachectic middle-age Caucasian male  not in any distress. . Afebrile. Head is nontraumatic. Neck is supple without bruit.    Cardiac exam no murmur or gallop. Lungs are clear to auscultation. Distal pulses are well felt. Neurological Exam ;  Awake  Alert oriented x 3. Normal speech and language.eye movements full without nystagmus.fundi were not visualized. Vision acuity and fields appear normal. Hearing is normal. Palatal  movements are normal. Face symmetric. Tongue midline. Normal strength, tone, reflexes and coordination. Normal sensation. Gait deferred.  ASSESSMENT/PLAN Jesus Watkins is a 45 y.o. male with history of IVDU with heroin presenting with bilateral vision loss. He did not receive IV t-PA due to delay in arrival.   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 Prominent detnal disease. Prominent palatine tonsils without discrete mass. Increased scattered small LNs  2D Echo  EF 55-60%, normal LVH. Filamentous structure at RA/IVC jxn - either chiari network vs vegetation in setting of bacteremia  LDL ordered  TEE  No  endocarditis  HgbA1c 6.0 in Nov  Lovenox 40 mg sq daily for VTE prophylaxis Diet Heart Room service appropriate? Yes; Fluid consistency: Thin  No antithrombotic prior to admission, now on No antithrombotic add low dose aspirin for secondary stroke prevention per guidelines  Patient counseled to be compliant with his antithrombotic medications  Ongoing aggressive stroke risk factor management  Therapy recommendations:  pending   Disposition:  pending   Hypotension Resolved  Other Stroke Risk Factors  Heroin use  Other IV drug use  Cigarette smoker, advised to stop smoking  Other Active Problems  ARF, resolved  RLL PNA, ? Aspiration vs sepsis vs endocarditis - ID following  Recent compartment syndrome d/t drug use  Hospital day # 6  I have personally examined this patient, reviewed notes, independently viewed imaging studies, participated in medical decision making and plan of care.ROS completed by me personally and pertinent positives fully documented  I  have made any additions or clarifications directly to the above note. Continue aspirin for stroke prevention.  TEE shows no vegetations but ID prefers to treat as endocarditis. Stroke team will sign off. Kindly call for questions. Delia HeadyPramod Nailah Luepke, MD Medical Director [facility] Pager: [phone] 12/19/2016 11:18 AM   To contact Stroke Continuity provider, please refer to [email or web address]. After hours, contact General Neurology

## 2016-12-19 NOTE — Op Note (Signed)
INDICATIONS: infective endocarditis; cryptogenic stroke  PROCEDURE:   Informed consent was obtained prior to the procedure. The risks, benefits and alternatives for the procedure were discussed and the patient comprehended these risks.  Risks include, but are not limited to, cough, sore throat, vomiting, nausea, somnolence, esophageal and stomach trauma or perforation, bleeding, low blood pressure, aspiration, pneumonia, infection, trauma to the teeth and death.    After a procedural time-out, the oropharynx was anesthetized with 20% benzocaine spray.   During this procedure the patient was administered IV propofol by Anesthesiology, Dr. Desmond Lopeurk.  The transesophageal probe was inserted in the esophagus and stomach without difficulty and multiple views were obtained.  The patient was kept under observation until the patient left the procedure room.  The patient left the procedure room in stable condition.   Agitated microbubble saline contrast was administered.  COMPLICATIONS:    There were no immediate complications.  FINDINGS:  Normal TEE. No vegetations seen. No intracardiac thrombi/masses. No ASD/PFO. Moderate atherosclerotic plaque is seen, but is located in the distal arch and descending aorta.   Time Spent Directly with the Patient:  40 minutes   Lakshmi Sundeen 12/19/2016, 10:23 AM

## 2016-12-19 NOTE — Anesthesia Postprocedure Evaluation (Signed)
Anesthesia Post Note  Patient: Jesus Watkins  Procedure(s) Performed: Procedure(s) (LRB): TRANSESOPHAGEAL ECHOCARDIOGRAM (TEE) (N/A)  Patient location during evaluation: PACU Anesthesia Type: MAC Level of consciousness: awake and alert Pain management: pain level controlled Vital Signs Assessment: post-procedure vital signs reviewed and stable Respiratory status: spontaneous breathing, nonlabored ventilation, respiratory function stable and patient connected to nasal cannula oxygen Cardiovascular status: stable Anesthetic complications: no       Last Vitals:  Vitals:   12/19/16 1029 12/19/16 1040  BP: 133/77 111/60  Pulse: 69 (!) 57  Resp: 15 18  Temp: 36.7 C     Last Pain:  Vitals:   12/19/16 1029  TempSrc: Oral  PainSc:                  Cecile HearingStephen Edward Turk

## 2016-12-19 NOTE — Progress Notes (Signed)
  Echocardiogram Echocardiogram Transesophageal has been performed.  Janalyn HarderWest, Ever Halberg R 12/19/2016, 10:32 AM

## 2016-12-20 DIAGNOSIS — R7881 Bacteremia: Secondary | ICD-10-CM

## 2016-12-20 LAB — CULTURE, BLOOD (ROUTINE X 2)

## 2016-12-20 MED ORDER — DICYCLOMINE HCL 20 MG PO TABS: 20.0000 mg | ORAL_TABLET | Freq: Four times a day (QID) | ORAL | Status: DC | PRN

## 2016-12-20 MED ORDER — HYDROXYZINE HCL 25 MG PO TABS: 25.0000 mg | ORAL_TABLET | Freq: Four times a day (QID) | ORAL | Status: DC | PRN

## 2016-12-20 MED ORDER — ONDANSETRON 4 MG PO TBDP: 4.0000 mg | ORAL_TABLET | Freq: Four times a day (QID) | ORAL | Status: DC | PRN

## 2016-12-20 MED ORDER — LOPERAMIDE HCL 2 MG PO CAPS: 2.0000 mg | ORAL_CAPSULE | ORAL | Status: DC | PRN

## 2016-12-20 MED ORDER — NAPROXEN 500 MG PO TABS
500.0000 mg | ORAL_TABLET | Freq: Two times a day (BID) | ORAL | Status: DC | PRN
  Filled 2016-12-20: qty 1

## 2016-12-20 MED ORDER — METHOCARBAMOL 500 MG PO TABS
500.0000 mg | ORAL_TABLET | Freq: Three times a day (TID) | ORAL | Status: DC | PRN
  Administered 2016-12-21 – 2016-12-22 (×2): 500 mg via ORAL
  Filled 2016-12-20 (×2): qty 1

## 2016-12-20 NOTE — Progress Notes (Signed)
Pharmacy Antibiotic Note  Jesus Watkins is a 45 y.o. male admitted on 12/13/2016 with sepsis 2/2 pneumonia vs IDVA.  Pharmacy has been consulted for vancomycin and Zosyn dosing. Today is day 8 of abx. WBC trended down but slightly elevated at 11.1, afebrile. Embolic CVA noted, TEE negative, cultures grew gram positive bacillus species. ID recommends DISCONTINUING ZOSYN  12/27 vanc trough therapeutic at 17  Plan: Continue vancomycin 1000mg  IV every 12 hours.  Goal trough 15-20 mcg/mL. Continue Zosyn 3.375g IV q8h (4 hour infusion) Monitor renal function, clinical progression F/u TEE, cultures, abx LOT  Height: 5\' 8"  (172.7 cm) Weight: 147 lb 11.2 oz (67 kg) IBW/kg (Calculated) : 68.4  Temp (24hrs), Avg:98.1 F (36.7 C), Min:97.8 F (36.6 C), Max:98.5 F (36.9 C)   Recent Labs Lab 12/13/16 1716 12/13/16 1735  12/13/16 2115 12/14/16 0528 12/14/16 0826 12/15/16 0348 12/15/16 0700 12/15/16 1000 12/16/16 0536 12/17/16 0847 12/19/16 0918 12/19/16 1326  WBC 14.7*  --   --   --  28.4*  --  27.4*  --   --  20.7*  --   --  11.1*  CREATININE 1.74* 1.40*  --   --  1.66*  --  1.21  --   --  1.14  --  1.08  --   LATICACIDVEN  --   --   < > 2.4* 2.8* 2.5*  --  0.7 1.1  --   --   --   --   VANCOTROUGH  --   --   --   --   --   --   --   --   --   --  17  --   --   < > = values in this interval not displayed.  Estimated Creatinine Clearance: 82.7 mL/min (by C-G formula based on SCr of 1.08 mg/dL).    No Known Allergies  Antimicrobials this admission: Vanc 12/23 >>  Zosyn 12/23 >>   Dose adjustments this admission: n/a  Microbiology results: 12/23 BCx: gram positive bacillus species 12/23 UCx: no growth final  Thank you for allowing pharmacy to be a part of this patient's care.  Jesus Watkins, PharmD Clinical Pharmacist Pager: [phone] 12/20/2016 3:17 PM

## 2016-12-20 NOTE — Progress Notes (Signed)
PROGRESS NOTE        PATIENT DETAILS Name: Jesus Watkins Age: 45 y.o. Sex: male Date of Birth: 03/05/1971 Admit Date: 12/13/2016 Admitting Physician Oretha Milchakesh V Alva, MD PCP:No PCP Per Patient  Brief Narrative: Patient is a 45 y.o. 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 See below for further details  Subjective: Visual loss has resolved-he thinks his vision is back to his baseline.  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 Etiology thought to be due to HCAP and Bacillus bacteremia/Endocarditis. Antibiotics narrowed down to Vancomycin,  repeat blood cultures on 12/29 negative so far.   Healthcare associated pneumonia: Non toxic-appearing, leukocytosis has improved. Completed approximately 7 days of  empiric antimicrobial therapy. Remains on IV Vancomycin for possible endocarditis/Bacillus bacteremia.   Acute left temporal lobe infarct, questionable right temporal/parietal lobe infarcts: Thankfully visual loss/blurry vision changes have resolved. CVA thought to be embolic in nature- suspicion for underlying endocarditis given IV drug use. CTA of the neck did not show any significant stenosis/dissection. 2-D echocardiogram with EF around 55-60%, filamentous structure at RA/IVC junction-thought to be either a vegetation or a Chiari network. TEE on 12/29 did not show any vegetations, no further recommendations from stroke team.  LDL 86, recent A1c in November was 6.0.  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 Although TEE neg for vegetation- given overall clinical picture-high suspicion for endocarditis-furthermore vegetations could have already embolized. Plans are for 6 weeks of IV Vancomycin. Patient willing to go to SNF for  antimicrobial therapy-not a candidate for outpatient IV Abx tx due to active IVDA.   Heroin abuse/withdrawal: No signs of withdrawal at present- slowly continue to taper down methadone to discontinue in the next few days. Given soft BP will stop Clonidine patch today.Have asked social worker to evaluate for outpatient substance abuse rehabilitation/resources-patient seems to have desire to quite drug use.   Acute kidney injury: Resolved, likely due to sepsis/hypotension.  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  TEE 12/29>>Negative for vegetations  CONSULTS:  pulmonary/intensive care, ID and neurology  Time spent: 25 minutes-Greater than 50% of this time was spent in counseling, explanation of diagnosis, planning of further management, and coordination of care.  MEDICATIONS: Anti-infectives    Start     Dose/Rate Route Frequency Ordered Stop   12/16/16 1000  vancomycin (VANCOCIN) IVPB 1000 mg/200 mL premix     1,000 mg 200 mL/hr over 60 Minutes Intravenous Every 12 hours 12/16/16 0930     12/14/16 0800  vancomycin (VANCOCIN) IVPB 1000 mg/200 mL premix  Status:  Discontinued     1,000 mg 200 mL/hr over 60 Minutes Intravenous Every 12 hours 12/13/16 2019 12/16/16 0930   12/14/16 0200  piperacillin-tazobactam (ZOSYN) IVPB 3.375 g  Status:  Discontinued     3.375 g 12.5 mL/hr over 240 Minutes Intravenous Every 8 hours 12/13/16 2019 12/20/16 1546   12/13/16 2015  piperacillin-tazobactam (ZOSYN) IVPB 3.375 g     3.375 g 100 mL/hr over 30 Minutes Intravenous  Once 12/13/16 2007 12/13/16 2136   12/13/16 2015   vancomycin (VANCOCIN) IVPB 1000 mg/200 mL premix     1,000 mg 200 mL/hr over 60 Minutes Intravenous  Once 12/13/16 2007 12/13/16 2229      Scheduled Meds: . aspirin EC  81 mg Oral Daily  . enoxaparin (LOVENOX) injection  40 mg Subcutaneous Q24H  . mouth rinse  15 mL Mouth Rinse BID  . methadone  5 mg Oral Daily  . vancomycin  1,000 mg Intravenous Q12H   Continuous Infusions: PRN Meds:.dicyclomine, hydrOXYzine, loperamide, LORazepam, methocarbamol, naproxen, ondansetron (ZOFRAN) IV   PHYSICAL EXAM: Vital signs: Vitals:   12/19/16 1752 12/19/16 2159 12/20/16 0451 12/20/16 0900  BP: (!) 100/59 (!) 88/49 98/62 (!) 96/49  Pulse: (!) 52 (!) 56 63 (!) 59  Resp: 18 18 19 18   Temp: 98 F (36.7 C) 97.8 F (36.6 C) 98 F (36.7 C) 98.5 F (36.9 C)  TempSrc: Oral Oral Oral Oral  SpO2: 98% 98% 98% 97%  Weight:  67 kg (147 lb 11.2 oz)    Height:       Filed Weights   12/17/16 2102 12/18/16 2115 12/19/16 2159  Weight: 69.8 kg (153 lb 14.1 oz) 69.4 kg (153 lb) 67 kg (147 lb 11.2 oz)   Body mass index is 22.46 kg/m.   General appearance :Awake, alert, not in any distress. Speech Clear. Not toxic Looking Eyes:, pupils equally reactive to light and accomodation,no scleral icterus.Pink conjunctiva HEENT: Atraumatic and Normocephalic Neck: supple, no JVD. No cervical lymphadenopathy. No thyromegaly Resp:Good air entry bilaterally, no added sounds  CVS: S1 S2 regular, no murmurs.  GI: Bowel sounds present, Non tender and not distended with no gaurding, rigidity or rebound.No organomegaly Extremities: B/L Lower Ext shows no edema, both legs are warm to touch Neurology:  speech clear,Non focal, sensation is grossly intact. Psychiatric: Normal judgment and insight. Alert and oriented x 3. Normal mood. Musculoskeletal:gait appears to be normal.No digital cyanosis Skin:No Rash, warm and dry Wounds:N/A  I have personally reviewed following labs and imaging studies  LABORATORY  DATA: CBC:  Recent Labs Lab 12/13/16 1716 12/13/16 1735 12/14/16 0528 12/15/16 0348 12/16/16 0536 12/19/16 1326  WBC 14.7*  --  28.4* 27.4* 20.7* 11.1*  NEUTROABS 14.3*  --   --   --  16.6* 6.3  HGB 11.1* 11.2* 9.8* 9.0* 9.4* 11.1*  HCT 34.9* 33.0* 30.8* 28.1* 29.2* 34.7*  MCV 85.7  --  83.9 84.6 83.4 85.5  PLT 228  --  170 185 241 PLATELET CLUMPS NOTED ON SMEAR, COUNT APPEARS ADEQUATE    Basic Metabolic Panel:  Recent Labs Lab 12/13/16 1716 12/13/16 1735 12/14/16 0528 12/15/16 0348 12/16/16 0536 12/19/16 0918  NA 135 135 137 139 138 140  K 4.1 4.1 4.1 3.5 4.5 4.6  CL 99* 99* 106 106 107 106  CO2 22  --  20* 24 22 23   GLUCOSE 158* 161* 145* 88 96 84  BUN 21* 22* 23* 19 16 18   CREATININE 1.74* 1.40* 1.66* 1.21 1.14 1.08  CALCIUM 8.9  --  7.4* 8.1* 9.0 9.2  MG  --   --  1.4*  --  1.7  --   PHOS  --   --  4.0  --   --   --     GFR: Estimated Creatinine Clearance: 82.7 mL/min (by C-G formula based on SCr of  1.08 mg/dL).  Liver Function Tests:  Recent Labs Lab 12/13/16 1716 12/16/16 0536  AST 120* 20  ALT 39 23  ALKPHOS 235* 147*  BILITOT 0.8 0.4  PROT 6.6 6.4*  ALBUMIN 3.2* 2.7*   No results for input(s): LIPASE, AMYLASE in the last 168 hours. No results for input(s): AMMONIA in the last 168 hours.  Coagulation Profile:  Recent Labs Lab 12/13/16 1716  INR 1.25    Cardiac Enzymes:  Recent Labs Lab 12/15/16 1052 12/15/16 1630 12/15/16 2212  TROPONINI <0.03 <0.03 <0.03    BNP (last 3 results) No results for input(s): PROBNP in the last 8760 hours.  HbA1C: No results for input(s): HGBA1C in the last 72 hours.  CBG: No results for input(s): GLUCAP in the last 168 hours.  Lipid Profile: No results for input(s): CHOL, HDL, LDLCALC, TRIG, CHOLHDL, LDLDIRECT in the last 72 hours.  Thyroid Function Tests: No results for input(s): TSH, T4TOTAL, FREET4, T3FREE, THYROIDAB in the last 72 hours.  Anemia Panel: No results for input(s):  VITAMINB12, FOLATE, FERRITIN, TIBC, IRON, RETICCTPCT in the last 72 hours.  Urine analysis:    Component Value Date/Time   COLORURINE YELLOW 12/13/2016 2310   APPEARANCEUR HAZY (A) 12/13/2016 2310   LABSPEC 1.034 (H) 12/13/2016 2310   PHURINE 5.0 12/13/2016 2310   GLUCOSEU NEGATIVE 12/13/2016 2310   HGBUR SMALL (A) 12/13/2016 2310   BILIRUBINUR NEGATIVE 12/13/2016 2310   KETONESUR NEGATIVE 12/13/2016 2310   PROTEINUR 100 (A) 12/13/2016 2310   NITRITE NEGATIVE 12/13/2016 2310   LEUKOCYTESUR NEGATIVE 12/13/2016 2310    Sepsis Labs: Lactic Acid, Venous    Component Value Date/Time   LATICACIDVEN 1.1 12/15/2016 1000    MICROBIOLOGY: Recent Results (from the past 240 hour(s))  Blood culture (routine x 2)     Status: Abnormal   Collection Time: 12/13/16  7:20 PM  Result Value Ref Range Status   Specimen Description BLOOD RIGHT UPPER ARM  Final   Special Requests BOTTLES DRAWN AEROBIC AND ANAEROBIC 5CC EA  Final   Culture  Setup Time   Final    GRAM POSITIVE RODS ANAEROBIC BOTTLE ONLY CRITICAL RESULT CALLED TO, READ BACK BY AND VERIFIED WITH: ADagoberto Ligas.D. 15:00 12/17/16 ([email or web address])    Culture (A)  Final    BACILLUS SPECIES Standardized susceptibility testing for this organism is not available.    Report Status 12/19/2016 FINAL  Final  Blood Culture ID Panel (Reflexed)     Status: None   Collection Time: 12/13/16  7:20 PM  Result Value Ref Range Status   Enterococcus species NOT DETECTED NOT DETECTED Final   Listeria monocytogenes NOT DETECTED NOT DETECTED Final   Staphylococcus species NOT DETECTED NOT DETECTED Final   Staphylococcus aureus NOT DETECTED NOT DETECTED Final   Streptococcus species NOT DETECTED NOT DETECTED Final   Streptococcus agalactiae NOT DETECTED NOT DETECTED Final   Streptococcus pneumoniae NOT DETECTED NOT DETECTED Final   Streptococcus pyogenes NOT DETECTED NOT DETECTED Final   Acinetobacter baumannii NOT DETECTED NOT DETECTED Final    Enterobacteriaceae species NOT DETECTED NOT DETECTED Final   Enterobacter cloacae complex NOT DETECTED NOT DETECTED Final   Escherichia coli NOT DETECTED NOT DETECTED Final   Klebsiella oxytoca NOT DETECTED NOT DETECTED Final   Klebsiella pneumoniae NOT DETECTED NOT DETECTED Final   Proteus species NOT DETECTED NOT DETECTED Final   Serratia marcescens NOT DETECTED NOT DETECTED Final   Haemophilus influenzae NOT DETECTED NOT DETECTED Final   Neisseria meningitidis NOT DETECTED NOT  DETECTED Final   Pseudomonas aeruginosa NOT DETECTED NOT DETECTED Final   Candida albicans NOT DETECTED NOT DETECTED Final   Candida glabrata NOT DETECTED NOT DETECTED Final   Candida krusei NOT DETECTED NOT DETECTED Final   Candida parapsilosis NOT DETECTED NOT DETECTED Final   Candida tropicalis NOT DETECTED NOT DETECTED Final  Blood culture (routine x 2)     Status: Abnormal   Collection Time: 12/13/16  7:30 PM  Result Value Ref Range Status   Specimen Description BLOOD LEFT HAND  Final   Special Requests BOTTLES DRAWN AEROBIC AND ANAEROBIC 5CC EA  Final   Culture  Setup Time   Final    GRAM POSITIVE RODS ANAEROBIC BOTTLE ONLY CRITICAL VALUE NOTED.  VALUE IS CONSISTENT WITH PREVIOUSLY REPORTED AND CALLED VALUE.    Culture (A)  Final    BACILLUS SPECIES Standardized susceptibility testing for this organism is not available.    Report Status 12/20/2016 FINAL  Final  Urine culture     Status: None   Collection Time: 12/13/16 11:10 PM  Result Value Ref Range Status   Specimen Description URINE, CATHETERIZED  Final   Special Requests NONE  Final   Culture NO GROWTH  Final   Report Status 12/15/2016 FINAL  Final  MRSA PCR Screening     Status: None   Collection Time: 12/14/16 12:53 AM  Result Value Ref Range Status   MRSA by PCR NEGATIVE NEGATIVE Final    Comment:        The GeneXpert MRSA Assay (FDA approved for NASAL specimens only), is one component of a comprehensive MRSA  colonization surveillance program. It is not intended to diagnose MRSA infection nor to guide or monitor treatment for MRSA infections.   Culture, blood (routine x 2)     Status: None (Preliminary result)   Collection Time: 12/19/16  1:30 PM  Result Value Ref Range Status   Specimen Description BLOOD LEFT ARM  Final   Special Requests BOTTLES DRAWN AEROBIC ONLY 5CC  Final   Culture NO GROWTH < 24 HOURS  Final   Report Status PENDING  Incomplete  Culture, blood (routine x 2)     Status: None (Preliminary result)   Collection Time: 12/19/16  1:35 PM  Result Value Ref Range Status   Specimen Description BLOOD LEFT ANTECUBITAL  Final   Special Requests BOTTLES DRAWN AEROBIC ONLY 5CC  Final   Culture NO GROWTH < 24 HOURS  Final   Report Status PENDING  Incomplete    RADIOLOGY STUDIES/RESULTS: Ct Angio Head W Or Wo Contrast  Result Date: 12/13/2016 CLINICAL DATA:  45 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 Dizziness. Injected herion this morning. Subsequent encounter. EXAM: CT ANGIOGRAPHY HEAD AND NECK TECHNIQUE: Multidetector CT imaging of the head and neck was performed using the standard protocol during bolus administration of intravenous contrast. Multiplanar CT image reconstructions and MIPs were obtained to evaluate the vascular anatomy. Carotid stenosis measurements (when applicable) are obtained utilizing NASCET criteria, using the distal internal carotid diameter as the denominator. CONTRAST:  75 cc Isovue 370. COMPARISON:  12/13/2016 head CT. FINDINGS: CT HEAD FINDINGS Brain: No intracranial hemorrhage or CT evidence of large acute infarct. No intracranial mass or enhancement.  No hydrocephalus. Vascular: As below Skull: No acute abnormality. Sinuses: Mucosal thickening left sphenoid sinus, maxillary sinuses bilaterally and opacification ethmoid sinus air cells bilaterally. Orbits: No acute abnormality. Review of  the MIP images  confirms the above findings CTA NECK FINDINGS Aortic arch: 3 vessel arch with mild plaque. Right carotid system: Slightly irregular appearance of the right carotid artery throughout its course with mild narrowing which may represent changes of inflammation from drug abuse or atherosclerotic changes. No evidence of dissection or high-grade stenosis. Left carotid system: Slightly irregular appearance of the left carotid artery throughout its course with mild narrowing which may represent changes of inflammation from drug abuse or atherosclerotic change. No evidence of dissection or high-grade stenosis. Vertebral arteries: Left vertebral artery is dominant. No high-grade stenosis of either vertebral artery. Skeleton: Prominent dental disease. Cervical spondylotic changes most notable C6-7. Other neck: Prominent palatine tonsils may indicate inflammation without discrete mass identified. Direct visualization would be necessary to exclude subtle mucosal abnormality. Scattered increased number of small lymph nodes possibly reactive in origin. Upper chest: No lung apical lesion. Review of the MIP images confirms the above findings CTA HEAD FINDINGS Anterior circulation: Mild irregularity of medium and large size vessels of the anterior circulation without high-grade stenosis. Mild to moderate branch vessel irregularity. Posterior circulation: Moderate narrowing distal right vertebral artery. Mild irregularity distal left vertebral artery. Mild to moderate narrowing portions of the basilar artery. Posterior cerebral artery mild to moderate branch vessel irregularity and narrowing. Venous sinuses: Patent. Anatomic variants: Fetal contribution to the right posterior cerebral artery. Delayed phase: As above. Review of the MIP images confirms the above findings IMPRESSION: CT HEAD No intracranial hemorrhage or CT evidence of large acute infarct. No intracranial mass or enhancement. Mucosal thickening left  sphenoid sinus, maxillary sinuses bilaterally and opacification ethmoid sinus air cells bilaterally. CTA NECK Slightly irregular appearance of the carotid artery throughout its course bilaterally with mild narrowing which may represent changes of inflammation from drug abuse or atherosclerotic changes. No evidence of dissection or high-grade stenosis. Left vertebral artery is dominant. No high-grade stenosis of either vertebral artery. Prominent dental disease. Prominent palatine tonsils may indicate inflammation without discrete mass. Direct visualization would be necessary to exclude mucosa abnormality. Scattered increased number of small lymph nodes possibly reactive in origin. CTA HEAD Mild irregularity of medium and large size vessels of the anterior circulation without high-grade stenosis. Mild to mild branch vessel irregularity. Moderate narrowing distal right vertebral artery. Mild irregularity distal left vertebral artery. Mild to moderate narrowing portions of the basilar artery. Posterior cerebral artery mild to moderate branch vessel irregularity and narrowing. Findings may indicate result of vasculopathy related to patient's drug use or secondary to underlying atherosclerotic changes. Electronically Signed   By: Lacy Duverney M.D.   On: 12/13/2016 22:16   Ct Head Wo Contrast  Result Date: 12/13/2016 CLINICAL DATA:  Pt woke up about 2 hrs ago and was blind had a headache He is able to see images now EXAM: CT HEAD WITHOUT CONTRAST TECHNIQUE: Contiguous axial images were obtained from the base of the skull through the vertex without intravenous contrast. COMPARISON:  None. FINDINGS: Brain: No mass lesion, hemorrhage, hydrocephalus, acute infarct, intra-axial, or extra-axial fluid collection. Vascular: No hyperdense vessel or unexpected calcification. Skull: Normal Sinuses/Orbits: Normal orbits and globes. Mucosal thickening of ethmoid air cells and left sphenoid sinus. Clear mastoid air cells. Other:   None IMPRESSION: 1.  No acute intracranial abnormality. 2. Sinus disease. Electronically Signed   By: Jeronimo Greaves M.D.   On: 12/13/2016 18:26   Ct Angio Neck W And/or Wo Contrast  Result Date: 12/13/2016 CLINICAL DATA:  45 year old hypertensive male with history of herion abuse awoke from a  nap only able to see light from both eyes. Dizziness. Injected herion this morning. Subsequent encounter. EXAM: CT ANGIOGRAPHY HEAD AND NECK TECHNIQUE: Multidetector CT imaging of the head and neck was performed using the standard protocol during bolus administration of intravenous contrast. Multiplanar CT image reconstructions and MIPs were obtained to evaluate the vascular anatomy. Carotid stenosis measurements (when applicable) are obtained utilizing NASCET criteria, using the distal internal carotid diameter as the denominator. CONTRAST:  75 cc Isovue 370. COMPARISON:  12/13/2016 head CT. FINDINGS: CT HEAD FINDINGS Brain: No intracranial hemorrhage or CT evidence of large acute infarct. No intracranial mass or enhancement.  No hydrocephalus. Vascular: As below Skull: No acute abnormality. Sinuses: Mucosal thickening left sphenoid sinus, maxillary sinuses bilaterally and opacification ethmoid sinus air cells bilaterally. Orbits: No acute abnormality. Review of the MIP images confirms the above findings CTA NECK FINDINGS Aortic arch: 3 vessel arch with mild plaque. Right carotid system: Slightly irregular appearance of the right carotid artery throughout its course with mild narrowing which may represent changes of inflammation from drug abuse or atherosclerotic changes. No evidence of dissection or high-grade stenosis. Left carotid system: Slightly irregular appearance of the left carotid artery throughout its course with mild narrowing which may represent changes of inflammation from drug abuse or atherosclerotic change. No evidence of dissection or high-grade stenosis. Vertebral arteries: Left vertebral artery is  dominant. No high-grade stenosis of either vertebral artery. Skeleton: Prominent dental disease. Cervical spondylotic changes most notable C6-7. Other neck: Prominent palatine tonsils may indicate inflammation without discrete mass identified. Direct visualization would be necessary to exclude subtle mucosal abnormality. Scattered increased number of small lymph nodes possibly reactive in origin. Upper chest: No lung apical lesion. Review of the MIP images confirms the above findings CTA HEAD FINDINGS Anterior circulation: Mild irregularity of medium and large size vessels of the anterior circulation without high-grade stenosis. Mild to moderate branch vessel irregularity. Posterior circulation: Moderate narrowing distal right vertebral artery. Mild irregularity distal left vertebral artery. Mild to moderate narrowing portions of the basilar artery. Posterior cerebral artery mild to moderate branch vessel irregularity and narrowing. Venous sinuses: Patent. Anatomic variants: Fetal contribution to the right posterior cerebral artery. Delayed phase: As above. Review of the MIP images confirms the above findings IMPRESSION: CT HEAD No intracranial hemorrhage or CT evidence of large acute infarct. No intracranial mass or enhancement. Mucosal thickening left sphenoid sinus, maxillary sinuses bilaterally and opacification ethmoid sinus air cells bilaterally. CTA NECK Slightly irregular appearance of the carotid artery throughout its course bilaterally with mild narrowing which may represent changes of inflammation from drug abuse or atherosclerotic changes. No evidence of dissection or high-grade stenosis. Left vertebral artery is dominant. No high-grade stenosis of either vertebral artery. Prominent dental disease. Prominent palatine tonsils may indicate inflammation without discrete mass. Direct visualization would be necessary to exclude mucosa abnormality. Scattered increased number of small lymph nodes possibly  reactive in origin. CTA HEAD Mild irregularity of medium and large size vessels of the anterior circulation without high-grade stenosis. Mild to mild branch vessel irregularity. Moderate narrowing distal right vertebral artery. Mild irregularity distal left vertebral artery. Mild to moderate narrowing portions of the basilar artery. Posterior cerebral artery mild to moderate branch vessel irregularity and narrowing. Findings may indicate result of vasculopathy related to patient's drug use or secondary to underlying atherosclerotic changes. Electronically Signed   By: Lacy Duverney M.D.   On: 12/13/2016 22:16   Mr Laqueta Jean And Wo Contrast  Result Date: 12/14/2016 CLINICAL DATA:  Transient vision loss. Headache. Fever. Hairline abuse. EXAM: MRI HEAD WITHOUT AND WITH CONTRAST TECHNIQUE: Multiplanar, multiecho pulse sequences of the brain and surrounding structures were obtained without and with intravenous contrast. CONTRAST:  14mL MULTIHANCE GADOBENATE DIMEGLUMINE 529 MG/ML IV SOLN COMPARISON:  Head CT/ CTA 12/13/2016 FINDINGS: The study is mildly motion degraded. Brain: There is a 2 mm punctate focus of restricted diffusion involving cortex or subcortical white matter in the posterior left temporal lobe (series 3, image 23). Similar punctate foci of restricted diffusion are questioned in the right temporal lobe (series 3, image 23) and right parietal lobe (series 9, image 7). There is no evidence of intracranial hemorrhage, mass, midline shift, or extra-axial fluid collection. The ventricles and sulci are normal. No significant chronic ischemic changes are identified within limitations of motion artifact. No abnormal enhancement is identified. Vascular: Major intracranial vascular flow voids are preserved. Skull and upper cervical spine: Unremarkable bone marrow signal. Sinuses/Orbits: Unremarkable orbits. Mild mucosal thickening throughout the paranasal sinuses. Small right mastoid effusion. Other: None.  IMPRESSION: 1. 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 2. Questionable additional tiny embolic infarcts versus artifact in the right temporal and right parietal lobes. Electronically Signed   By: Sebastian AcheAllen  Grady M.D.   On: 12/14/2016 08:18   Dg Chest Portable 1 View  Result Date: 12/13/2016 CLINICAL DATA:  Shortness of breath and productive cough for 2 weeks, worsening. EXAM: PORTABLE CHEST 1 VIEW COMPARISON:  PA and lateral chest 11/04/2016. FINDINGS: There is new right lower lobe airspace disease. Left lung is clear. Heart size is normal. No pneumothorax or pleural effusion. Remote left rib fracture is noted. IMPRESSION: Right lower lobe airspace disease most consistent with pneumonia. Recommend followup to clearing. Electronically Signed   By: Drusilla Kannerhomas  Dalessio M.D.   On: 12/13/2016 20:10     LOS: 7 days   Jeoffrey MassedGHIMIRE,SHANKER, MD  [facility] Pager:[phone]  If 7PM-7AM, please contact night-coverage [email or web address] Password [email or web address] 12/20/2016, 3:48 PM

## 2016-12-21 LAB — VANCOMYCIN, TROUGH: Vancomycin Tr: 19 ug/mL (ref 15–20)

## 2016-12-21 NOTE — Progress Notes (Signed)
PROGRESS NOTE        PATIENT DETAILS Name: Jesus Watkins Age: 45 y.o. Sex: male Date of Birth: 09/15/1971 Admit Date: 12/13/2016 Admitting Physician Oretha Milchakesh V Alva, MD PCP:No PCP Per Patient  Brief Narrative: Patient is a 45 y.o. 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 See below for further details  Subjective: Visual loss has resolved-he thinks his vision is back to his baseline. No withdrawal symptoms.  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 Etiology thought to be due to HCAP and Bacillus bacteremia/Endocarditis. Antibiotics narrowed down to Vancomycin,  repeat blood cultures on 12/29 negative so far.   Healthcare associated pneumonia: Non toxic-appearing, leukocytosis has improved. Completed approximately 7 days of  empiric antimicrobial therapy. Remains on IV Vancomycin for possible endocarditis/Bacillus bacteremia.   Acute left temporal lobe infarct, questionable right temporal/parietal lobe infarcts: Thankfully visual loss/blurry vision changes have resolved. CVA thought to be embolic in nature- suspicion for underlying endocarditis given IV drug use. CTA of the neck did not show any significant stenosis/dissection. 2-D echocardiogram with EF around 55-60%, filamentous structure at RA/IVC junction-thought to be either a vegetation or a Chiari network. TEE on 12/29 did not show any vegetations, no further recommendations from stroke team.  LDL 86, recent A1c in November was 6.0.  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 Although TEE neg for vegetation- given overall clinical picture-high suspicion for endocarditis-furthermore vegetations could have already embolized. Plans are for 6 weeks of IV Vancomycin. Patient  willing to go to SNF for antimicrobial therapy-not a candidate for outpatient IV Abx tx due to active IVDA.   Heroin abuse/withdrawal: No signs of withdrawal at present- slowly continue to taper down methadone to discontinue probably tomorrow, clonidine stopped on 12/30.Have asked social worker to evaluate for outpatient substance abuse rehabilitation/resources-patient seems to have desire to quite drug use.   Acute kidney injury: Resolved, likely due to sepsis/hypotension.  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  TEE 12/29>>Negative for vegetations  CONSULTS:  pulmonary/intensive care, ID and neurology  Time spent: 25 minutes-Greater than 50% of this time was spent in counseling, explanation of diagnosis, planning of further management, and coordination of care.  MEDICATIONS: Anti-infectives    Start     Dose/Rate Route Frequency Ordered Stop   12/16/16 1000  vancomycin (VANCOCIN) IVPB 1000 mg/200 mL premix     1,000 mg 200 mL/hr over 60 Minutes Intravenous Every 12 hours 12/16/16 0930     12/14/16 0800  vancomycin (VANCOCIN) IVPB 1000 mg/200 mL premix  Status:  Discontinued     1,000 mg 200 mL/hr over 60 Minutes Intravenous Every 12 hours 12/13/16 2019 12/16/16 0930   12/14/16 0200  piperacillin-tazobactam (ZOSYN) IVPB 3.375 g  Status:  Discontinued     3.375 g 12.5 mL/hr over 240 Minutes Intravenous Every 8 hours 12/13/16 2019 12/20/16 1546   12/13/16 2015  piperacillin-tazobactam (ZOSYN) IVPB 3.375  g     3.375 g 100 mL/hr over 30 Minutes Intravenous  Once 12/13/16 2007 12/13/16 2136   12/13/16 2015   vancomycin (VANCOCIN) IVPB 1000 mg/200 mL premix     1,000 mg 200 mL/hr over 60 Minutes Intravenous  Once 12/13/16 2007 12/13/16 2229      Scheduled Meds: . aspirin EC  81 mg Oral Daily  . enoxaparin (LOVENOX) injection  40 mg Subcutaneous Q24H  . mouth rinse  15 mL Mouth Rinse BID  . methadone  5 mg Oral Daily  . vancomycin  1,000 mg Intravenous Q12H   Continuous Infusions: PRN Meds:.dicyclomine, hydrOXYzine, loperamide, LORazepam, methocarbamol, naproxen, ondansetron (ZOFRAN) IV   PHYSICAL EXAM: Vital signs: Vitals:   12/20/16 1900 12/20/16 2105 12/21/16 0554 12/21/16 0900  BP: 97/60 (!) 106/51 (!) 94/48 (!) 98/46  Pulse: 65 (!) 56 (!) 59 (!) 57  Resp: 18 18 18 18   Temp: 98.5 F (36.9 C) 98.2 F (36.8 C) 97.6 F (36.4 C) 98.4 F (36.9 C)  TempSrc: Oral Oral Oral Oral  SpO2: 98% 98% 97% 98%  Weight:  66.5 kg (146 lb 11.2 oz)    Height:       Filed Weights   12/18/16 2115 12/19/16 2159 12/20/16 2105  Weight: 69.4 kg (153 lb) 67 kg (147 lb 11.2 oz) 66.5 kg (146 lb 11.2 oz)   Body mass index is 22.31 kg/m.   General appearance :Awake, alert, not in any distress. Speech Clear. Not toxic Looking Eyes:, pupils equally reactive to light and accomodation,no scleral icterus.Pink conjunctiva HEENT: Atraumatic and Normocephalic Neck: supple, no JVD. No cervical lymphadenopathy. No thyromegaly Resp:Good air entry bilaterally, no added sounds  CVS: S1 S2 regular, no murmurs.  GI: Bowel sounds present, Non tender and not distended with no gaurding, rigidity or rebound.No organomegaly Extremities: B/L Lower Ext shows no edema, both legs are warm to touch Neurology:  speech clear,Non focal, sensation is grossly intact. Psychiatric: Normal judgment and insight. Alert and oriented x 3. Normal mood. Musculoskeletal:gait appears to be normal.No digital cyanosis Skin:No Rash, warm and dry Wounds:N/A  I have personally reviewed following labs and imaging studies  LABORATORY  DATA: CBC:  Recent Labs Lab 12/15/16 0348 12/16/16 0536 12/19/16 1326  WBC 27.4* 20.7* 11.1*  NEUTROABS  --  16.6* 6.3  HGB 9.0* 9.4* 11.1*  HCT 28.1* 29.2* 34.7*  MCV 84.6 83.4 85.5  PLT 185 241 PLATELET CLUMPS NOTED ON SMEAR, COUNT APPEARS ADEQUATE    Basic Metabolic Panel:  Recent Labs Lab 12/15/16 0348 12/16/16 0536 12/19/16 0918  NA 139 138 140  K 3.5 4.5 4.6  CL 106 107 106  CO2 24 22 23   GLUCOSE 88 96 84  BUN 19 16 18   CREATININE 1.21 1.14 1.08  CALCIUM 8.1* 9.0 9.2  MG  --  1.7  --     GFR: Estimated Creatinine Clearance: 82.1 mL/min (by C-G formula based on SCr of 1.08 mg/dL).  Liver Function Tests:  Recent Labs Lab 12/16/16 0536  AST 20  ALT 23  ALKPHOS 147*  BILITOT 0.4  PROT 6.4*  ALBUMIN 2.7*   No results for input(s): LIPASE, AMYLASE in the last 168 hours. No results for input(s): AMMONIA in the last 168 hours.  Coagulation Profile: No results for input(s): INR, PROTIME in the last 168 hours.  Cardiac Enzymes:  Recent Labs Lab 12/15/16 1052 12/15/16 1630 12/15/16 2212  TROPONINI <0.03 <0.03 <0.03    BNP (last 3 results) No results for input(s): PROBNP  in the last 8760 hours.  HbA1C: No results for input(s): HGBA1C in the last 72 hours.  CBG: No results for input(s): GLUCAP in the last 168 hours.  Lipid Profile: No results for input(s): CHOL, HDL, LDLCALC, TRIG, CHOLHDL, LDLDIRECT in the last 72 hours.  Thyroid Function Tests: No results for input(s): TSH, T4TOTAL, FREET4, T3FREE, THYROIDAB in the last 72 hours.  Anemia Panel: No results for input(s): VITAMINB12, FOLATE, FERRITIN, TIBC, IRON, RETICCTPCT in the last 72 hours.  Urine analysis:    Component Value Date/Time   COLORURINE YELLOW 12/13/2016 2310   APPEARANCEUR HAZY (A) 12/13/2016 2310   LABSPEC 1.034 (H) 12/13/2016 2310   PHURINE 5.0 12/13/2016 2310   GLUCOSEU NEGATIVE 12/13/2016 2310   HGBUR SMALL (A) 12/13/2016 2310   BILIRUBINUR NEGATIVE 12/13/2016  2310   KETONESUR NEGATIVE 12/13/2016 2310   PROTEINUR 100 (A) 12/13/2016 2310   NITRITE NEGATIVE 12/13/2016 2310   LEUKOCYTESUR NEGATIVE 12/13/2016 2310    Sepsis Labs: Lactic Acid, Venous    Component Value Date/Time   LATICACIDVEN 1.1 12/15/2016 1000    MICROBIOLOGY: Recent Results (from the past 240 hour(s))  Blood culture (routine x 2)     Status: Abnormal   Collection Time: 12/13/16  7:20 PM  Result Value Ref Range Status   Specimen Description BLOOD RIGHT UPPER ARM  Final   Special Requests BOTTLES DRAWN AEROBIC AND ANAEROBIC 5CC EA  Final   Culture  Setup Time   Final    GRAM POSITIVE RODS ANAEROBIC BOTTLE ONLY CRITICAL RESULT CALLED TO, READ BACK BY AND VERIFIED WITH: ADagoberto Ligas.D. 15:00 12/17/16 ([email or web address])    Culture (A)  Final    BACILLUS SPECIES Standardized susceptibility testing for this organism is not available.    Report Status 12/19/2016 FINAL  Final  Blood Culture ID Panel (Reflexed)     Status: None   Collection Time: 12/13/16  7:20 PM  Result Value Ref Range Status   Enterococcus species NOT DETECTED NOT DETECTED Final   Listeria monocytogenes NOT DETECTED NOT DETECTED Final   Staphylococcus species NOT DETECTED NOT DETECTED Final   Staphylococcus aureus NOT DETECTED NOT DETECTED Final   Streptococcus species NOT DETECTED NOT DETECTED Final   Streptococcus agalactiae NOT DETECTED NOT DETECTED Final   Streptococcus pneumoniae NOT DETECTED NOT DETECTED Final   Streptococcus pyogenes NOT DETECTED NOT DETECTED Final   Acinetobacter baumannii NOT DETECTED NOT DETECTED Final   Enterobacteriaceae species NOT DETECTED NOT DETECTED Final   Enterobacter cloacae complex NOT DETECTED NOT DETECTED Final   Escherichia coli NOT DETECTED NOT DETECTED Final   Klebsiella oxytoca NOT DETECTED NOT DETECTED Final   Klebsiella pneumoniae NOT DETECTED NOT DETECTED Final   Proteus species NOT DETECTED NOT DETECTED Final   Serratia marcescens NOT DETECTED NOT  DETECTED Final   Haemophilus influenzae NOT DETECTED NOT DETECTED Final   Neisseria meningitidis NOT DETECTED NOT DETECTED Final   Pseudomonas aeruginosa NOT DETECTED NOT DETECTED Final   Candida albicans NOT DETECTED NOT DETECTED Final   Candida glabrata NOT DETECTED NOT DETECTED Final   Candida krusei NOT DETECTED NOT DETECTED Final   Candida parapsilosis NOT DETECTED NOT DETECTED Final   Candida tropicalis NOT DETECTED NOT DETECTED Final  Blood culture (routine x 2)     Status: Abnormal   Collection Time: 12/13/16  7:30 PM  Result Value Ref Range Status   Specimen Description BLOOD LEFT HAND  Final   Special Requests BOTTLES DRAWN AEROBIC AND ANAEROBIC 5CC EA  Final  Culture  Setup Time   Final    GRAM POSITIVE RODS ANAEROBIC BOTTLE ONLY CRITICAL VALUE NOTED.  VALUE IS CONSISTENT WITH PREVIOUSLY REPORTED AND CALLED VALUE.    Culture (A)  Final    BACILLUS SPECIES Standardized susceptibility testing for this organism is not available.    Report Status 12/20/2016 FINAL  Final  Urine culture     Status: None   Collection Time: 12/13/16 11:10 PM  Result Value Ref Range Status   Specimen Description URINE, CATHETERIZED  Final   Special Requests NONE  Final   Culture NO GROWTH  Final   Report Status 12/15/2016 FINAL  Final  MRSA PCR Screening     Status: None   Collection Time: 12/14/16 12:53 AM  Result Value Ref Range Status   MRSA by PCR NEGATIVE NEGATIVE Final    Comment:        The GeneXpert MRSA Assay (FDA approved for NASAL specimens only), is one component of a comprehensive MRSA colonization surveillance program. It is not intended to diagnose MRSA infection nor to guide or monitor treatment for MRSA infections.   Culture, blood (routine x 2)     Status: None (Preliminary result)   Collection Time: 12/19/16  1:30 PM  Result Value Ref Range Status   Specimen Description BLOOD LEFT ARM  Final   Special Requests BOTTLES DRAWN AEROBIC ONLY 5CC  Final   Culture NO  GROWTH 1 DAY  Final   Report Status PENDING  Incomplete  Culture, blood (routine x 2)     Status: None (Preliminary result)   Collection Time: 12/19/16  1:35 PM  Result Value Ref Range Status   Specimen Description BLOOD LEFT ANTECUBITAL  Final   Special Requests BOTTLES DRAWN AEROBIC ONLY 5CC  Final   Culture NO GROWTH 1 DAY  Final   Report Status PENDING  Incomplete    RADIOLOGY STUDIES/RESULTS: Ct Angio Head W Or Wo Contrast  Result Date: 12/13/2016 CLINICAL DATA:  45 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 Dizziness. Injected herion this morning. Subsequent encounter. EXAM: CT ANGIOGRAPHY HEAD AND NECK TECHNIQUE: Multidetector CT imaging of the head and neck was performed using the standard protocol during bolus administration of intravenous contrast. Multiplanar CT image reconstructions and MIPs were obtained to evaluate the vascular anatomy. Carotid stenosis measurements (when applicable) are obtained utilizing NASCET criteria, using the distal internal carotid diameter as the denominator. CONTRAST:  75 cc Isovue 370. COMPARISON:  12/13/2016 head CT. FINDINGS: CT HEAD FINDINGS Brain: No intracranial hemorrhage or CT evidence of large acute infarct. No intracranial mass or enhancement.  No hydrocephalus. Vascular: As below Skull: No acute abnormality. Sinuses: Mucosal thickening left sphenoid sinus, maxillary sinuses bilaterally and opacification ethmoid sinus air cells bilaterally. Orbits: No acute abnormality. Review of the MIP images confirms the above findings CTA NECK FINDINGS Aortic arch: 3 vessel arch with mild plaque. Right carotid system: Slightly irregular appearance of the right carotid artery throughout its course with mild narrowing which may represent changes of inflammation from drug abuse or atherosclerotic changes. No evidence of dissection or high-grade stenosis. Left carotid system: Slightly irregular  appearance of the left carotid artery throughout its course with mild narrowing which may represent changes of inflammation from drug abuse or atherosclerotic change. No evidence of dissection or high-grade stenosis. Vertebral arteries: Left vertebral artery is dominant. No high-grade stenosis of either vertebral artery. Skeleton: Prominent dental disease. Cervical spondylotic changes  most notable C6-7. Other neck: Prominent palatine tonsils may indicate inflammation without discrete mass identified. Direct visualization would be necessary to exclude subtle mucosal abnormality. Scattered increased number of small lymph nodes possibly reactive in origin. Upper chest: No lung apical lesion. Review of the MIP images confirms the above findings CTA HEAD FINDINGS Anterior circulation: Mild irregularity of medium and large size vessels of the anterior circulation without high-grade stenosis. Mild to moderate branch vessel irregularity. Posterior circulation: Moderate narrowing distal right vertebral artery. Mild irregularity distal left vertebral artery. Mild to moderate narrowing portions of the basilar artery. Posterior cerebral artery mild to moderate branch vessel irregularity and narrowing. Venous sinuses: Patent. Anatomic variants: Fetal contribution to the right posterior cerebral artery. Delayed phase: As above. Review of the MIP images confirms the above findings IMPRESSION: CT HEAD No intracranial hemorrhage or CT evidence of large acute infarct. No intracranial mass or enhancement. Mucosal thickening left sphenoid sinus, maxillary sinuses bilaterally and opacification ethmoid sinus air cells bilaterally. CTA NECK Slightly irregular appearance of the carotid artery throughout its course bilaterally with mild narrowing which may represent changes of inflammation from drug abuse or atherosclerotic changes. No evidence of dissection or high-grade stenosis. Left vertebral artery is dominant. No high-grade stenosis  of either vertebral artery. Prominent dental disease. Prominent palatine tonsils may indicate inflammation without discrete mass. Direct visualization would be necessary to exclude mucosa abnormality. Scattered increased number of small lymph nodes possibly reactive in origin. CTA HEAD Mild irregularity of medium and large size vessels of the anterior circulation without high-grade stenosis. Mild to mild branch vessel irregularity. Moderate narrowing distal right vertebral artery. Mild irregularity distal left vertebral artery. Mild to moderate narrowing portions of the basilar artery. Posterior cerebral artery mild to moderate branch vessel irregularity and narrowing. Findings may indicate result of vasculopathy related to patient's drug use or secondary to underlying atherosclerotic changes. Electronically Signed   By: Lacy DuverneySteven  Olson M.D.   On: 12/13/2016 22:16   Ct Head Wo Contrast  Result Date: 12/13/2016 CLINICAL DATA:  Pt woke up about 2 hrs ago and was blind had a headache He is able to see images now EXAM: CT HEAD WITHOUT CONTRAST TECHNIQUE: Contiguous axial images were obtained from the base of the skull through the vertex without intravenous contrast. COMPARISON:  None. FINDINGS: Brain: No mass lesion, hemorrhage, hydrocephalus, acute infarct, intra-axial, or extra-axial fluid collection. Vascular: No hyperdense vessel or unexpected calcification. Skull: Normal Sinuses/Orbits: Normal orbits and globes. Mucosal thickening of ethmoid air cells and left sphenoid sinus. Clear mastoid air cells. Other:  None IMPRESSION: 1.  No acute intracranial abnormality. 2. Sinus disease. Electronically Signed   By: Jeronimo GreavesKyle  Talbot M.D.   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 Dizziness. Injected herion this morning. Subsequent encounter. EXAM:  CT ANGIOGRAPHY HEAD AND NECK TECHNIQUE: Multidetector CT imaging of the head and neck was performed using the standard protocol during bolus administration of intravenous contrast. Multiplanar CT image reconstructions and MIPs were obtained to evaluate the vascular anatomy. Carotid stenosis measurements (when applicable) are obtained utilizing NASCET criteria, using the distal internal carotid diameter as the denominator. CONTRAST:  75 cc Isovue 370. COMPARISON:  12/13/2016 head CT. FINDINGS: CT HEAD FINDINGS Brain: No intracranial hemorrhage or CT evidence of large acute infarct. No intracranial mass or enhancement.  No  hydrocephalus. Vascular: As below Skull: No acute abnormality. Sinuses: Mucosal thickening left sphenoid sinus, maxillary sinuses bilaterally and opacification ethmoid sinus air cells bilaterally. Orbits: No acute abnormality. Review of the MIP images confirms the above findings CTA NECK FINDINGS Aortic arch: 3 vessel arch with mild plaque. Right carotid system: Slightly irregular appearance of the right carotid artery throughout its course with mild narrowing which may represent changes of inflammation from drug abuse or atherosclerotic changes. No evidence of dissection or high-grade stenosis. Left carotid system: Slightly irregular appearance of the left carotid artery throughout its course with mild narrowing which may represent changes of inflammation from drug abuse or atherosclerotic change. No evidence of dissection or high-grade stenosis. Vertebral arteries: Left vertebral artery is dominant. No high-grade stenosis of either vertebral artery. Skeleton: Prominent dental disease. Cervical spondylotic changes most notable C6-7. Other neck: Prominent palatine tonsils may indicate inflammation without discrete mass identified. Direct visualization would be necessary to exclude subtle mucosal abnormality. Scattered increased number of small lymph nodes possibly reactive in origin. Upper chest: No  lung apical lesion. Review of the MIP images confirms the above findings CTA HEAD FINDINGS Anterior circulation: Mild irregularity of medium and large size vessels of the anterior circulation without high-grade stenosis. Mild to moderate branch vessel irregularity. Posterior circulation: Moderate narrowing distal right vertebral artery. Mild irregularity distal left vertebral artery. Mild to moderate narrowing portions of the basilar artery. Posterior cerebral artery mild to moderate branch vessel irregularity and narrowing. Venous sinuses: Patent. Anatomic variants: Fetal contribution to the right posterior cerebral artery. Delayed phase: As above. Review of the MIP images confirms the above findings IMPRESSION: CT HEAD No intracranial hemorrhage or CT evidence of large acute infarct. No intracranial mass or enhancement. Mucosal thickening left sphenoid sinus, maxillary sinuses bilaterally and opacification ethmoid sinus air cells bilaterally. CTA NECK Slightly irregular appearance of the carotid artery throughout its course bilaterally with mild narrowing which may represent changes of inflammation from drug abuse or atherosclerotic changes. No evidence of dissection or high-grade stenosis. Left vertebral artery is dominant. No high-grade stenosis of either vertebral artery. Prominent dental disease. Prominent palatine tonsils may indicate inflammation without discrete mass. Direct visualization would be necessary to exclude mucosa abnormality. Scattered increased number of small lymph nodes possibly reactive in origin. CTA HEAD Mild irregularity of medium and large size vessels of the anterior circulation without high-grade stenosis. Mild to mild branch vessel irregularity. Moderate narrowing distal right vertebral artery. Mild irregularity distal left vertebral artery. Mild to moderate narrowing portions of the basilar artery. Posterior cerebral artery mild to moderate branch vessel irregularity and narrowing.  Findings may indicate result of vasculopathy related to patient's drug use or secondary to underlying atherosclerotic changes. Electronically Signed   By: Lacy Duverney M.D.   On: 12/13/2016 22:16   Mr Laqueta Jean And Wo Contrast  Result Date: 12/14/2016 CLINICAL DATA:  Transient vision loss. Headache. Fever. Hairline abuse. EXAM: MRI HEAD WITHOUT AND WITH CONTRAST TECHNIQUE: Multiplanar, multiecho pulse sequences of the brain and surrounding structures were obtained without and with intravenous contrast. CONTRAST:  14mL MULTIHANCE GADOBENATE DIMEGLUMINE 529 MG/ML IV SOLN COMPARISON:  Head CT/ CTA 12/13/2016 FINDINGS: The study is mildly motion degraded. Brain: There is a 2 mm punctate focus of restricted diffusion involving cortex or subcortical white matter in the posterior left temporal lobe (series 3, image 23). Similar punctate foci of restricted diffusion are questioned in the right temporal lobe (series 3, image 23) and right parietal lobe (series 9, image 7).  There is no evidence of intracranial hemorrhage, mass, midline shift, or extra-axial fluid collection. The ventricles and sulci are normal. No significant chronic ischemic changes are identified within limitations of motion artifact. No abnormal enhancement is identified. Vascular: Major intracranial vascular flow voids are preserved. Skull and upper cervical spine: Unremarkable bone marrow signal. Sinuses/Orbits: Unremarkable orbits. Mild mucosal thickening throughout the paranasal sinuses. Small right mastoid effusion. Other: None. IMPRESSION: 1. 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 2. Questionable additional tiny embolic infarcts versus artifact in the right temporal and right parietal lobes. Electronically Signed   By: Sebastian Ache M.D.   On: 12/14/2016 08:18   Dg Chest Portable 1 View  Result Date: 12/13/2016 CLINICAL DATA:  Shortness of breath and productive cough for 2 weeks, worsening. EXAM: PORTABLE CHEST 1 VIEW  COMPARISON:  PA and lateral chest 11/04/2016. FINDINGS: There is new right lower lobe airspace disease. Left lung is clear. Heart size is normal. No pneumothorax or pleural effusion. Remote left rib fracture is noted. IMPRESSION: Right lower lobe airspace disease most consistent with pneumonia. Recommend followup to clearing. Electronically Signed   By: Drusilla Kanner M.D.   On: 12/13/2016 20:10     LOS: 8 days   Jeoffrey Massed, MD  [facility] Pager:[phone]  If 7PM-7AM, please contact night-coverage [email or web address] Password [email or web address] 12/21/2016, 2:23 PM

## 2016-12-22 ENCOUNTER — Encounter (HOSPITAL_COMMUNITY): Payer: Self-pay | Admitting: Cardiovascular Disease

## 2016-12-22 DIAGNOSIS — I38 Endocarditis, valve unspecified: Secondary | ICD-10-CM

## 2016-12-22 HISTORY — DX: Endocarditis, valve unspecified: I38

## 2016-12-22 LAB — DRUG PROFILE, UR, 9 DRUGS (LABCORP)
AMPHETAMINES, URINE: POSITIVE — AB
BARBITURATE, UR: NEGATIVE ng/mL
Benzodiazepine Quant, Ur: NEGATIVE ng/mL
Cannabinoid Quant, Ur: NEGATIVE ng/mL
Cocaine (Metab.): NEGATIVE ng/mL
METHADONE SCREEN, URINE: NEGATIVE ng/mL
Opiate Quant, Ur: POSITIVE — AB
PROPOXYPHENE, URINE: NEGATIVE ng/mL
Phencyclidine, Ur: NEGATIVE ng/mL

## 2016-12-22 MED ORDER — SODIUM CHLORIDE 0.9% FLUSH
10.0000 mL | INTRAVENOUS | Status: DC | PRN
  Administered 2016-12-23: 10 mL
  Filled 2016-12-22: qty 40

## 2016-12-22 NOTE — Progress Notes (Signed)
    [facility]    Date of Admission:  12/13/2016   Total days of antibiotics 10        Day 10 vanco           ID: Jesus Watkins is a 46 y.o. 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 Still having occ chills and nausea.  First time detoxing in 2 yrs he reports. He is looking forward/wanting to go to rehab program  Medications:  . aspirin EC  81 mg Oral Daily  . enoxaparin (LOVENOX) injection  40 mg Subcutaneous Q24H  . mouth rinse  15 mL Mouth Rinse BID  . vancomycin  1,000 mg Intravenous Q12H    Objective: Vital signs in last 24 hours: Temp:  [97.8 F (36.6 C)-99.1 F (37.3 C)] 98.8 F (37.1 C) (01/01 0907) Pulse Rate:  [61-85] 62 (01/01 0907) Resp:  [16-20] 16 (01/01 0907) BP: (90-111)/(48-63) 93/51 (01/01 0907) SpO2:  [96 %-98 %] 96 % (01/01 0907) Weight:  [147 lb (66.7 kg)] 147 lb (66.7 kg) (12/31 2053)  Physical Exam  Constitutional: He is oriented to person, place, and time. He appears well-developed and well-nourished. No distress.  HENT:  Mouth/Throat: Oropharynx is clear and moist. No oropharyngeal exudate.  Cardiovascular: Normal rate, regular rhythm and normal heart sounds. Exam reveals no gallop and no friction rub.  No murmur heard.  Pulmonary/Chest: Effort normal and breath sounds normal. No respiratory distress. He has no wheezes.  Abdominal: Soft. Bowel sounds are normal. He exhibits no distension. There is no tenderness.  Lymphadenopathy:  He has no cervical adenopathy.  Neurological: He is alert and oriented to person, place,  and time.  Skin: Skin is warm and dry. No rash noted. No erythema.  Psychiatric: He has a normal mood and affect. His behavior is normal.   Lab Results  Component Value Date   ESRSEDRATE 57 (H) 11/05/2016    Microbiology: 12/29 blood cx ngtd 12/23 blood cx bacillus  Studies/Results: No results found.   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 Recommend to treat for 6 wk. He is currently day 10 of 42.   Would check labs to see that vanco level and cr is in good state.  Heroin withdrawal/detox = he appears to be through the worst since now  here for 10 days without any drug use.  dispo = have him finish iv abtx at SNF (Remaining 32 days) vs. If able to find residential treatment program, such as [facility], may consider changing to an oral abtx such as linezolid (would need to find access to meds since it is expensive)  Dr Orvan Falconerampbell to provide further [facility]pshirerecs  Haunani Dickard, [facility] Cell: [phone] Pager: [phone]  12/22/2016, 3:35 PM

## 2016-12-22 NOTE — Progress Notes (Signed)
PROGRESS NOTE        PATIENT DETAILS Name: Jesus Watkins Age: 46 y.o. Sex: male Date of Birth: 06/03/1971 Admit Date: 12/13/2016 Admitting Physician Oretha Milchakesh V Alva, MD PCP:No PCP Per Patient  Brief Narrative: Patient is a 46 y.o. 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 See below for further details  Subjective No major complaints-very thankful for the care he is received in the hospital-and is looking forward to go to SNF. No withdrawal symptoms.  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 Etiology thought to be due to HCAP and Bacillus bacteremia/Endocarditis. Antibiotics narrowed down to Vancomycin,  repeat blood cultures on 12/29 negative so far.   Healthcare associated pneumonia: Non toxic-appearing, leukocytosis has improved. Completed approximately 7 days of  empiric antimicrobial therapy. Remains on IV Vancomycin for possible endocarditis/Bacillus bacteremia.   Acute left temporal lobe infarct, questionable right temporal/parietal lobe infarcts: Thankfully visual loss/blurry vision changes have resolved. CVA thought to be embolic in nature- suspicion for underlying endocarditis given IV drug use. CTA of the neck did not show any significant stenosis/dissection. 2-D echocardiogram with EF around 55-60%, filamentous structure at RA/IVC junction-thought to be either a vegetation or a Chiari network. TEE on 12/29 did not show any vegetations, no further recommendations from stroke team.  LDL 86, recent A1c in November was 6.0.  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 Although TEE neg for vegetation- given overall clinical picture-high suspicion for endocarditis-furthermore vegetations could have already embolized. Plans  are for 6 weeks of IV Vancomycin. Patient willing to go to SNF for antimicrobial therapy-not a candidate for outpatient IV Abx tx due to active IVDA. Since repeat blood cultures on 12/29 negative, after discussion with ID-will order for a PICC line today.  Heroin abuse/withdrawal: No signs of withdrawal at present-methadone has been tapered down-we will discontinue today. No longer on clonidine since 12/30.Marland Kitchen.Have asked social worker to evaluate for outpatient substance abuse rehabilitation/resources-patient seems to have desire to quite drug use.   Acute kidney injury: Resolved, likely due to sepsis/hypotension.  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  TEE 12/29>>Negative for vegetations  CONSULTS:  pulmonary/intensive care, ID and neurology  Time spent: 25 minutes-Greater than 50% of this time was spent in counseling, explanation of diagnosis, planning of further management, and coordination of care.  MEDICATIONS: Anti-infectives    Start     Dose/Rate Route Frequency Ordered Stop   12/16/16 1000  vancomycin (VANCOCIN) IVPB 1000 mg/200 mL premix     1,000 mg 200 mL/hr over 60 Minutes Intravenous Every 12 hours 12/16/16 0930     12/14/16 0800  vancomycin (VANCOCIN) IVPB 1000 mg/200 mL premix  Status:  Discontinued     1,000 mg 200 mL/hr over 60 Minutes Intravenous Every 12 hours 12/13/16 2019 12/16/16 0930   12/14/16 0200  piperacillin-tazobactam (ZOSYN) IVPB 3.375 g  Status:  Discontinued     3.375  g 12.5 mL/hr over 240 Minutes Intravenous Every 8 hours 12/13/16 2019 12/20/16 1546   12/13/16 2015   piperacillin-tazobactam (ZOSYN) IVPB 3.375 g     3.375 g 100 mL/hr over 30 Minutes Intravenous  Once 12/13/16 2007 12/13/16 2136   12/13/16 2015  vancomycin (VANCOCIN) IVPB 1000 mg/200 mL premix     1,000 mg 200 mL/hr over 60 Minutes Intravenous  Once 12/13/16 2007 12/13/16 2229      Scheduled Meds: . aspirin EC  81 mg Oral Daily  . enoxaparin (LOVENOX) injection  40 mg Subcutaneous Q24H  . mouth rinse  15 mL Mouth Rinse BID  . methadone  5 mg Oral Daily  . vancomycin  1,000 mg Intravenous Q12H   Continuous Infusions: PRN Meds:.dicyclomine, hydrOXYzine, loperamide, LORazepam, methocarbamol, naproxen, ondansetron (ZOFRAN) IV   PHYSICAL EXAM: Vital signs: Vitals:   12/21/16 1749 12/21/16 2053 12/22/16 0437 12/22/16 0907  BP: (!) 90/48 111/63 (!) 97/56 (!) 93/51  Pulse: 61 85 70 62  Resp: 16 20 17 16   Temp: 97.8 F (36.6 C) 98.6 F (37 C) 99.1 F (37.3 C) 98.8 F (37.1 C)  TempSrc: Oral   Oral  SpO2: 98% 96% 98% 96%  Weight:  66.7 kg (147 lb)    Height:       Filed Weights   12/19/16 2159 12/20/16 2105 12/21/16 2053  Weight: 67 kg (147 lb 11.2 oz) 66.5 kg (146 lb 11.2 oz) 66.7 kg (147 lb)   Body mass index is 22.35 kg/m.   General appearance :Awake, alert, not in any distress. Speech Clear. Not toxic Looking Eyes:, pupils equally reactive to light and accomodation,no scleral icterus.Pink conjunctiva HEENT: Atraumatic and Normocephalic Neck: supple, no JVD. No cervical lymphadenopathy. No thyromegaly Resp:Good air entry bilaterally, no added sounds  CVS: S1 S2 regular, no murmurs.  GI: Bowel sounds present, Non tender and not distended with no gaurding, rigidity or rebound.No organomegaly Extremities: B/L Lower Ext shows no edema, both legs are warm to touch Neurology:  speech clear,Non focal, sensation is grossly intact. Psychiatric: Normal judgment and insight. Alert and oriented x 3. Normal mood. Musculoskeletal:gait appears to be normal.No digital  cyanosis Skin:No Rash, warm and dry Wounds:N/A  I have personally reviewed following labs and imaging studies  LABORATORY DATA: CBC:  Recent Labs Lab 12/16/16 0536 12/19/16 1326  WBC 20.7* 11.1*  NEUTROABS 16.6* 6.3  HGB 9.4* 11.1*  HCT 29.2* 34.7*  MCV 83.4 85.5  PLT 241 PLATELET CLUMPS NOTED ON SMEAR, COUNT APPEARS ADEQUATE    Basic Metabolic Panel:  Recent Labs Lab 12/16/16 0536 12/19/16 0918  NA 138 140  K 4.5 4.6  CL 107 106  CO2 22 23  GLUCOSE 96 84  BUN 16 18  CREATININE 1.14 1.08  CALCIUM 9.0 9.2  MG 1.7  --     GFR: Estimated Creatinine Clearance: 82.3 mL/min (by C-G formula based on SCr of 1.08 mg/dL).  Liver Function Tests:  Recent Labs Lab 12/16/16 0536  AST 20  ALT 23  ALKPHOS 147*  BILITOT 0.4  PROT 6.4*  ALBUMIN 2.7*   No results for input(s): LIPASE, AMYLASE in the last 168 hours. No results for input(s): AMMONIA in the last 168 hours.  Coagulation Profile: No results for input(s): INR, PROTIME in the last 168 hours.  Cardiac Enzymes:  Recent Labs Lab 12/15/16 1630 12/15/16 2212  TROPONINI <0.03 <0.03    BNP (last 3 results) No results for input(s): PROBNP in the last 8760 hours.  HbA1C: No  results for input(s): HGBA1C in the last 72 hours.  CBG: No results for input(s): GLUCAP in the last 168 hours.  Lipid Profile: No results for input(s): CHOL, HDL, LDLCALC, TRIG, CHOLHDL, LDLDIRECT in the last 72 hours.  Thyroid Function Tests: No results for input(s): TSH, T4TOTAL, FREET4, T3FREE, THYROIDAB in the last 72 hours.  Anemia Panel: No results for input(s): VITAMINB12, FOLATE, FERRITIN, TIBC, IRON, RETICCTPCT in the last 72 hours.  Urine analysis:    Component Value Date/Time   COLORURINE YELLOW 12/13/2016 2310   APPEARANCEUR HAZY (A) 12/13/2016 2310   LABSPEC 1.034 (H) 12/13/2016 2310   PHURINE 5.0 12/13/2016 2310   GLUCOSEU NEGATIVE 12/13/2016 2310   HGBUR SMALL (A) 12/13/2016 2310   BILIRUBINUR NEGATIVE  12/13/2016 2310   KETONESUR NEGATIVE 12/13/2016 2310   PROTEINUR 100 (A) 12/13/2016 2310   NITRITE NEGATIVE 12/13/2016 2310   LEUKOCYTESUR NEGATIVE 12/13/2016 2310    Sepsis Labs: Lactic Acid, Venous    Component Value Date/Time   LATICACIDVEN 1.1 12/15/2016 1000    MICROBIOLOGY: Recent Results (from the past 240 hour(s))  Blood culture (routine x 2)     Status: Abnormal   Collection Time: 12/13/16  7:20 PM  Result Value Ref Range Status   Specimen Description BLOOD RIGHT UPPER ARM  Final   Special Requests BOTTLES DRAWN AEROBIC AND ANAEROBIC 5CC EA  Final   Culture  Setup Time   Final    GRAM POSITIVE RODS ANAEROBIC BOTTLE ONLY CRITICAL RESULT CALLED TO, READ BACK BY AND VERIFIED WITH: ADagoberto Ligas.D. 15:00 12/17/16 ([email or web address])    Culture (A)  Final    BACILLUS SPECIES Standardized susceptibility testing for this organism is not available.    Report Status 12/19/2016 FINAL  Final  Blood Culture ID Panel (Reflexed)     Status: None   Collection Time: 12/13/16  7:20 PM  Result Value Ref Range Status   Enterococcus species NOT DETECTED NOT DETECTED Final   Listeria monocytogenes NOT DETECTED NOT DETECTED Final   Staphylococcus species NOT DETECTED NOT DETECTED Final   Staphylococcus aureus NOT DETECTED NOT DETECTED Final   Streptococcus species NOT DETECTED NOT DETECTED Final   Streptococcus agalactiae NOT DETECTED NOT DETECTED Final   Streptococcus pneumoniae NOT DETECTED NOT DETECTED Final   Streptococcus pyogenes NOT DETECTED NOT DETECTED Final   Acinetobacter baumannii NOT DETECTED NOT DETECTED Final   Enterobacteriaceae species NOT DETECTED NOT DETECTED Final   Enterobacter cloacae complex NOT DETECTED NOT DETECTED Final   Escherichia coli NOT DETECTED NOT DETECTED Final   Klebsiella oxytoca NOT DETECTED NOT DETECTED Final   Klebsiella pneumoniae NOT DETECTED NOT DETECTED Final   Proteus species NOT DETECTED NOT DETECTED Final   Serratia marcescens NOT DETECTED  NOT DETECTED Final   Haemophilus influenzae NOT DETECTED NOT DETECTED Final   Neisseria meningitidis NOT DETECTED NOT DETECTED Final   Pseudomonas aeruginosa NOT DETECTED NOT DETECTED Final   Candida albicans NOT DETECTED NOT DETECTED Final   Candida glabrata NOT DETECTED NOT DETECTED Final   Candida krusei NOT DETECTED NOT DETECTED Final   Candida parapsilosis NOT DETECTED NOT DETECTED Final   Candida tropicalis NOT DETECTED NOT DETECTED Final  Blood culture (routine x 2)     Status: Abnormal   Collection Time: 12/13/16  7:30 PM  Result Value Ref Range Status   Specimen Description BLOOD LEFT HAND  Final   Special Requests BOTTLES DRAWN AEROBIC AND ANAEROBIC 5CC EA  Final   Culture  Setup Time   Final  GRAM POSITIVE RODS ANAEROBIC BOTTLE ONLY CRITICAL VALUE NOTED.  VALUE IS CONSISTENT WITH PREVIOUSLY REPORTED AND CALLED VALUE.    Culture (A)  Final    BACILLUS SPECIES Standardized susceptibility testing for this organism is not available.    Report Status 12/20/2016 FINAL  Final  Urine culture     Status: None   Collection Time: 12/13/16 11:10 PM  Result Value Ref Range Status   Specimen Description URINE, CATHETERIZED  Final   Special Requests NONE  Final   Culture NO GROWTH  Final   Report Status 12/15/2016 FINAL  Final  MRSA PCR Screening     Status: None   Collection Time: 12/14/16 12:53 AM  Result Value Ref Range Status   MRSA by PCR NEGATIVE NEGATIVE Final    Comment:        The GeneXpert MRSA Assay (FDA approved for NASAL specimens only), is one component of a comprehensive MRSA colonization surveillance program. It is not intended to diagnose MRSA infection nor to guide or monitor treatment for MRSA infections.   Culture, blood (routine x 2)     Status: None (Preliminary result)   Collection Time: 12/19/16  1:30 PM  Result Value Ref Range Status   Specimen Description BLOOD LEFT ARM  Final   Special Requests BOTTLES DRAWN AEROBIC ONLY 5CC  Final   Culture  NO GROWTH 2 DAYS  Final   Report Status PENDING  Incomplete  Culture, blood (routine x 2)     Status: None (Preliminary result)   Collection Time: 12/19/16  1:35 PM  Result Value Ref Range Status   Specimen Description BLOOD LEFT ANTECUBITAL  Final   Special Requests BOTTLES DRAWN AEROBIC ONLY 5CC  Final   Culture NO GROWTH 2 DAYS  Final   Report Status PENDING  Incomplete    RADIOLOGY STUDIES/RESULTS: Ct Angio Head W Or Wo Contrast  Result Date: 12/13/2016 CLINICAL DATA:  46 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 Dizziness. Injected herion this morning. Subsequent encounter. EXAM: CT ANGIOGRAPHY HEAD AND NECK TECHNIQUE: Multidetector CT imaging of the head and neck was performed using the standard protocol during bolus administration of intravenous contrast. Multiplanar CT image reconstructions and MIPs were obtained to evaluate the vascular anatomy. Carotid stenosis measurements (when applicable) are obtained utilizing NASCET criteria, using the distal internal carotid diameter as the denominator. CONTRAST:  75 cc Isovue 370. COMPARISON:  12/13/2016 head CT. FINDINGS: CT HEAD FINDINGS Brain: No intracranial hemorrhage or CT evidence of large acute infarct. No intracranial mass or enhancement.  No hydrocephalus. Vascular: As below Skull: No acute abnormality. Sinuses: Mucosal thickening left sphenoid sinus, maxillary sinuses bilaterally and opacification ethmoid sinus air cells bilaterally. Orbits: No acute abnormality. Review of the MIP images confirms the above findings CTA NECK FINDINGS Aortic arch: 3 vessel arch with mild plaque. Right carotid system: Slightly irregular appearance of the right carotid artery throughout its course with mild narrowing which may represent changes of inflammation from drug abuse or atherosclerotic changes. No evidence of dissection or high-grade stenosis. Left carotid system: Slightly irregular  appearance of the left carotid artery throughout its course with mild narrowing which may represent changes of inflammation from drug abuse or atherosclerotic change. No evidence of dissection or high-grade stenosis. Vertebral arteries: Left vertebral artery is dominant. No high-grade stenosis of either vertebral artery. Skeleton: Prominent dental disease. Cervical spondylotic changes most notable C6-7. Other neck: Prominent palatine tonsils may indicate  inflammation without discrete mass identified. Direct visualization would be necessary to exclude subtle mucosal abnormality. Scattered increased number of small lymph nodes possibly reactive in origin. Upper chest: No lung apical lesion. Review of the MIP images confirms the above findings CTA HEAD FINDINGS Anterior circulation: Mild irregularity of medium and large size vessels of the anterior circulation without high-grade stenosis. Mild to moderate branch vessel irregularity. Posterior circulation: Moderate narrowing distal right vertebral artery. Mild irregularity distal left vertebral artery. Mild to moderate narrowing portions of the basilar artery. Posterior cerebral artery mild to moderate branch vessel irregularity and narrowing. Venous sinuses: Patent. Anatomic variants: Fetal contribution to the right posterior cerebral artery. Delayed phase: As above. Review of the MIP images confirms the above findings IMPRESSION: CT HEAD No intracranial hemorrhage or CT evidence of large acute infarct. No intracranial mass or enhancement. Mucosal thickening left sphenoid sinus, maxillary sinuses bilaterally and opacification ethmoid sinus air cells bilaterally. CTA NECK Slightly irregular appearance of the carotid artery throughout its course bilaterally with mild narrowing which may represent changes of inflammation from drug abuse or atherosclerotic changes. No evidence of dissection or high-grade stenosis. Left vertebral artery is dominant. No high-grade stenosis  of either vertebral artery. Prominent dental disease. Prominent palatine tonsils may indicate inflammation without discrete mass. Direct visualization would be necessary to exclude mucosa abnormality. Scattered increased number of small lymph nodes possibly reactive in origin. CTA HEAD Mild irregularity of medium and large size vessels of the anterior circulation without high-grade stenosis. Mild to mild branch vessel irregularity. Moderate narrowing distal right vertebral artery. Mild irregularity distal left vertebral artery. Mild to moderate narrowing portions of the basilar artery. Posterior cerebral artery mild to moderate branch vessel irregularity and narrowing. Findings may indicate result of vasculopathy related to patient's drug use or secondary to underlying atherosclerotic changes. Electronically Signed   By: Lacy Duverney M.D.   On: 12/13/2016 22:16   Ct Head Wo Contrast  Result Date: 12/13/2016 CLINICAL DATA:  Pt woke up about 2 hrs ago and was blind had a headache He is able to see images now EXAM: CT HEAD WITHOUT CONTRAST TECHNIQUE: Contiguous axial images were obtained from the base of the skull through the vertex without intravenous contrast. COMPARISON:  None. FINDINGS: Brain: No mass lesion, hemorrhage, hydrocephalus, acute infarct, intra-axial, or extra-axial fluid collection. Vascular: No hyperdense vessel or unexpected calcification. Skull: Normal Sinuses/Orbits: Normal orbits and globes. Mucosal thickening of ethmoid air cells and left sphenoid sinus. Clear mastoid air cells. Other:  None IMPRESSION: 1.  No acute intracranial abnormality. 2. Sinus disease. Electronically Signed   By: Jeronimo Greaves M.D.   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 Dizziness. Injected herion this morning. Subsequent encounter. EXAM:  CT ANGIOGRAPHY HEAD AND NECK TECHNIQUE: Multidetector CT imaging of the head and neck was performed using the standard protocol during bolus administration of intravenous contrast. Multiplanar CT image reconstructions and MIPs were obtained to evaluate the vascular anatomy. Carotid stenosis measurements (when applicable) are obtained utilizing NASCET criteria, using the distal internal carotid diameter as the denominator. CONTRAST:  75 cc Isovue 370. COMPARISON:  12/13/2016 head CT. FINDINGS: CT HEAD FINDINGS Brain: No intracranial hemorrhage or CT evidence of large acute infarct. No intracranial mass or enhancement.  No hydrocephalus. Vascular: As below Skull: No acute abnormality. Sinuses: Mucosal  thickening left sphenoid sinus, maxillary sinuses bilaterally and opacification ethmoid sinus air cells bilaterally. Orbits: No acute abnormality. Review of the MIP images confirms the above findings CTA NECK FINDINGS Aortic arch: 3 vessel arch with mild plaque. Right carotid system: Slightly irregular appearance of the right carotid artery throughout its course with mild narrowing which may represent changes of inflammation from drug abuse or atherosclerotic changes. No evidence of dissection or high-grade stenosis. Left carotid system: Slightly irregular appearance of the left carotid artery throughout its course with mild narrowing which may represent changes of inflammation from drug abuse or atherosclerotic change. No evidence of dissection or high-grade stenosis. Vertebral arteries: Left vertebral artery is dominant. No high-grade stenosis of either vertebral artery. Skeleton: Prominent dental disease. Cervical spondylotic changes most notable C6-7. Other neck: Prominent palatine tonsils may indicate inflammation without discrete mass identified. Direct visualization would be necessary to exclude subtle mucosal abnormality. Scattered increased number of small lymph nodes possibly reactive in origin. Upper chest: No  lung apical lesion. Review of the MIP images confirms the above findings CTA HEAD FINDINGS Anterior circulation: Mild irregularity of medium and large size vessels of the anterior circulation without high-grade stenosis. Mild to moderate branch vessel irregularity. Posterior circulation: Moderate narrowing distal right vertebral artery. Mild irregularity distal left vertebral artery. Mild to moderate narrowing portions of the basilar artery. Posterior cerebral artery mild to moderate branch vessel irregularity and narrowing. Venous sinuses: Patent. Anatomic variants: Fetal contribution to the right posterior cerebral artery. Delayed phase: As above. Review of the MIP images confirms the above findings IMPRESSION: CT HEAD No intracranial hemorrhage or CT evidence of large acute infarct. No intracranial mass or enhancement. Mucosal thickening left sphenoid sinus, maxillary sinuses bilaterally and opacification ethmoid sinus air cells bilaterally. CTA NECK Slightly irregular appearance of the carotid artery throughout its course bilaterally with mild narrowing which may represent changes of inflammation from drug abuse or atherosclerotic changes. No evidence of dissection or high-grade stenosis. Left vertebral artery is dominant. No high-grade stenosis of either vertebral artery. Prominent dental disease. Prominent palatine tonsils may indicate inflammation without discrete mass. Direct visualization would be necessary to exclude mucosa abnormality. Scattered increased number of small lymph nodes possibly reactive in origin. CTA HEAD Mild irregularity of medium and large size vessels of the anterior circulation without high-grade stenosis. Mild to mild branch vessel irregularity. Moderate narrowing distal right vertebral artery. Mild irregularity distal left vertebral artery. Mild to moderate narrowing portions of the basilar artery. Posterior cerebral artery mild to moderate branch vessel irregularity and narrowing.  Findings may indicate result of vasculopathy related to patient's drug use or secondary to underlying atherosclerotic changes. Electronically Signed   By: Lacy Duverney M.D.   On: 12/13/2016 22:16   Mr Laqueta Jean And Wo Contrast  Result Date: 12/14/2016 CLINICAL DATA:  Transient vision loss. Headache. Fever. Hairline abuse. EXAM: MRI HEAD WITHOUT AND WITH CONTRAST TECHNIQUE: Multiplanar, multiecho pulse sequences of the brain and surrounding structures were obtained without and with intravenous contrast. CONTRAST:  14mL MULTIHANCE GADOBENATE DIMEGLUMINE 529 MG/ML IV SOLN COMPARISON:  Head CT/ CTA 12/13/2016 FINDINGS: The study is mildly motion degraded. Brain: There is a 2 mm punctate focus of restricted diffusion involving cortex or subcortical white matter in the posterior left temporal lobe (series 3, image 23). Similar punctate foci of restricted diffusion are questioned in the right temporal lobe (series 3, image 23) and right parietal lobe (series 9, image 7). There is no evidence of intracranial hemorrhage, mass, midline shift,  or extra-axial fluid collection. The ventricles and sulci are normal. No significant chronic ischemic changes are identified within limitations of motion artifact. No abnormal enhancement is identified. Vascular: Major intracranial vascular flow voids are preserved. Skull and upper cervical spine: Unremarkable bone marrow signal. Sinuses/Orbits: Unremarkable orbits. Mild mucosal thickening throughout the paranasal sinuses. Small right mastoid effusion. Other: None. IMPRESSION: 1. 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 2. Questionable additional tiny embolic infarcts versus artifact in the right temporal and right parietal lobes. Electronically Signed   By: Sebastian Ache M.D.   On: 12/14/2016 08:18   Dg Chest Portable 1 View  Result Date: 12/13/2016 CLINICAL DATA:  Shortness of breath and productive cough for 2 weeks, worsening. EXAM: PORTABLE CHEST 1 VIEW  COMPARISON:  PA and lateral chest 11/04/2016. FINDINGS: There is new right lower lobe airspace disease. Left lung is clear. Heart size is normal. No pneumothorax or pleural effusion. Remote left rib fracture is noted. IMPRESSION: Right lower lobe airspace disease most consistent with pneumonia. Recommend followup to clearing. Electronically Signed   By: Drusilla Kanner M.D.   On: 12/13/2016 20:10     LOS: 9 days   Jeoffrey Massed, MD  [facility] Pager:[phone]  If 7PM-7AM, please contact night-coverage [email or web address] Password [email or web address] 12/22/2016, 11:34 AM

## 2016-12-22 NOTE — Progress Notes (Signed)
Peripherally Inserted Central Catheter/Midline Placement  The IV Nurse has discussed with the patient and/or persons authorized to consent for the patient, the purpose of this procedure and the potential benefits and risks involved with this procedure.  The benefits include less needle sticks, lab draws from the catheter, and the patient may be discharged home with the catheter. Risks include, but not limited to, infection, bleeding, blood clot (thrombus formation), and puncture of an artery; nerve damage and irregular heartbeat and possibility to perform a PICC exchange if needed/ordered by physician.  Alternatives to this procedure were also discussed.  Bard Power PICC patient education guide, fact sheet on infection prevention and patient information card has been provided to patient /or left at bedside.    PICC/Midline Placement Documentation  PICC Single Lumen 12/22/16 PICC Right Basilic 43 cm 0 cm (Active)  Indication for Insertion or Continuance of Line Home intravenous therapies (PICC only) 12/22/2016  6:00 PM  Exposed Catheter (cm) 0 cm 12/22/2016  6:00 PM  Site Assessment Clean;Dry;Intact 12/22/2016  6:00 PM  Line Status Blood return noted;Flushed;Saline locked 12/22/2016  6:00 PM  Dressing Type Transparent 12/22/2016  6:00 PM  Dressing Status Clean;Dry;Intact;Antimicrobial disc in place 12/22/2016  6:00 PM  Dressing Intervention New dressing 12/22/2016  6:00 PM  Dressing Change Due 12/29/16 12/22/2016  6:00 PM       Christeen Douglashomas, Kenley Troop L 12/22/2016, 6:16 PM

## 2016-12-22 NOTE — NC FL2 (Signed)
[place] MEDICAID FL2 LEVEL OF CARE SCREENING TOOL     IDENTIFICATION  Patient Name: Jesus Watkins Birthdate: 01/07/1971 Sex: male Admission Date (Current Location): 12/13/2016  [facility] [place]   Facility and Address:         Provider Number: [id number]  Attending Physician Name and Address:  Maretta BeesShanker M Ghimire, MD  Relative Name and Phone Number:  Karavanic,Jody - [phone]     Current Level of Care: Hospital Recommended Level of Care: Skilled Nursing Facility Prior Approval Number:    Date Approved/Denied:   PASRR Number: [id number] (Eff. 12/22/16)  Discharge Plan: SNF    Current Diagnoses: Patient Active Problem List   Diagnosis Date Noted  . Heroin withdrawal (HCC)   . Heroin abuse   . Sepsis due to pneumonia (HCC)   . HCAP (healthcare-associated pneumonia)   . Acute renal failure (HCC)   . Infarction of left temporal lobe (HCC)   . Severe sepsis (HCC) 12/14/2016  . Septic shock (HCC) 12/13/2016  . Abscess of antecubital fossa 11/05/2016  . Hyperglycemia 11/05/2016  . IVDA (intravenous drug abuse) complicating pregnancy 11/05/2016  . Leukocytosis 11/05/2016  . Essential hypertension 11/05/2016  . Homeless 11/05/2016  . Cellulitis     Orientation RESPIRATION BLADDER Height & Weight     Self, Time, Situation, Place  Normal Continent Weight: 147 lb (66.7 kg) Height:  5\' 8"  (172.7 cm)  BEHAVIORAL SYMPTOMS/MOOD NEUROLOGICAL BOWEL NUTRITION STATUS      Continent Diet (Heart Healthy)  AMBULATORY STATUS COMMUNICATION OF NEEDS Skin   Independent Verbally Normal                       Personal Care Assistance Level of Assistance  Bathing, Feeding, Dressing Bathing Assistance: Independent Feeding assistance: Independent Dressing Assistance: Independent     Functional Limitations Info  Sight, Hearing, Speech Sight Info: Adequate Hearing Info: Adequate Speech Info: Adequate    SPECIAL CARE FACTORS FREQUENCY                        Contractures Contractures Info: Not present    Additional Factors Info  Code Status, Allergies Code Status Info: Full Allergies Info: No Known allergies           Current Medications (12/22/2016):  This is the current hospital active medication list Current Facility-Administered Medications  Medication Dose Route Frequency Provider Last Rate Last Dose  . aspirin EC tablet 81 mg  81 mg Oral Daily Layne BentonSharon L Biby, NP   81 mg at 12/22/16 1036  . dicyclomine (BENTYL) tablet 20 mg  20 mg Oral Q6H PRN Maretta BeesShanker M Ghimire, MD      . enoxaparin (LOVENOX) injection 40 mg  40 mg Subcutaneous Q24H Cornell BarmanArun Kannappan, MD   40 mg at 12/22/16 1036  . hydrOXYzine (ATARAX/VISTARIL) tablet 25 mg  25 mg Oral Q6H PRN Shanker Levora DredgeM Ghimire, MD      . loperamide (IMODIUM) capsule 2-4 mg  2-4 mg Oral PRN Maretta BeesShanker M Ghimire, MD      . LORazepam (ATIVAN) tablet 1 mg  1 mg Oral Q8H PRN Oretha Milchakesh V Alva, MD   1 mg at 12/21/16 2124  . MEDLINE mouth rinse  15 mL Mouth Rinse BID Oretha Milchakesh V Alva, MD   15 mL at 12/22/16 1000  . methocarbamol (ROBAXIN) tablet 500 mg  500 mg Oral Q8H PRN Maretta BeesShanker M Ghimire, MD   500 mg at 12/21/16 2124  . naproxen (  NAPROSYN) tablet 500 mg  500 mg Oral BID PRN Maretta Bees, MD      . ondansetron [facility]) injection 4 mg  4 mg Intravenous Q6H PRN Drema Dallas, MD   4 mg at 12/15/16 1424  . vancomycin (VANCOCIN) IVPB 1000 mg/200 mL premix  1,000 mg Intravenous Q12H Drema Dallas, MD   1,000 mg at 12/22/16 1036     Discharge Medications: Please see discharge summary for a list of discharge medications.  Relevant Imaging Results:  Relevant Lab Results:   Additional Information ss#[id number]. Needs 6 weeks of IV antibiotics - Vancomycin.  Cristobal Goldmann, LCSW

## 2016-12-22 NOTE — Clinical Social Work Note (Signed)
Clinical Social Work Assessment  Patient Details  Name: Jesus Watkins MRM: [id number] Date of Birth: 1971/09/02  Date of referral:  12/22/16               Reason for consult:  Facility Placement                Permission sought to share information with:  Family Supports Permission granted to share information::  No  Name::        Agency::     Relationship::     Contact Information:     Housing/Transportation Living arrangements for the past 2 months:  Hotel/Motel (Travel [place], [place]) Source of Information:  Patient Patient Interpreter Needed:  None Criminal Activity/Legal Involvement Pertinent to Current Situation/Hospitalization:  Yes (Current substance use - heroin) Significant Relationships:  Friend Jesus Watkins) Lives with:  Friends (Lives at [address] with friend Jesus Watkins) Do you feel safe going back to the place where you live?  Yes Need for family participation in patient care:  No (Coment)  Care giving concerns: Patient understands that skilled facility needed for IV antibiotics - 6 weeks.   Social Worker assessment / plan:  CSW talked with patient regarding discharge plan and need for skilled facility to get 6 weeks of IV antibiotics. Patient was sitting up in bed and was pleasant and very receptive to talking with CSW regarding his discharge plan. 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 Per patient he has been using heroin for approx. 2 years and quit drinking about 5 years ago. When asked, patient reported that his only criminal history is a DUI approx. 5/6 years ago. Patient does smoke cigarettes and informed CSW that he wants one "real bad" and this was discussed in terms of smoking at a skilled facility.  He also reports a history of using marijuana. Jesus Watkins reported that he moved to [place] from [place] about 16 years ago  with his girlfriend, however they have since broken up. Patient indicated that he is ready to quit using and wants to quit using drugs. CSW and patient discussed    Employment status:  Other (Comment) (Patient works various jobs to have money for his [employer], food and drugs.) Insurance information:  Other (Comment Required) (No insurance) PT Recommendations:  Not assessed at this time Information / Referral to community resources:  Other (Comment Required) (Not provided with SNF list as LOG patient - no insurance.that can accept him. Not provided with)  Patient/Family's Response to care:  No concerns expressed by patient regarding his care during hospitalization.  Patient/Family's Understanding of and Emotional Response to Diagnosis, Current Treatment, and Prognosis: Patient wants to get his medical treatment (IV antibiotics) and is agreeable to SNF wherever he can be placed.   Emotional Assessment Appearance:  Appears stated age Attitude/Demeanor/Rapport:  Other (Appropriate) Affect (typically observed):  Pleasant, Appropriate Orientation:  Oriented to Self, Oriented to Place, Oriented to  Time, Oriented to Situation Alcohol / Substance use:  Tobacco Use, Alcohol Use, Illicit Drugs (Patient reported that he smokes, does not drink and uses IV heroin) Psych involvement (Current and /or in the community):  No (Comment)  Discharge Needs  Concerns to be addressed:  Discharge Planning Concerns Readmission within the last 30 days:  No Current discharge risk:  Substance Abuse Barriers to Discharge:  Inadequate  or no insurance (Patient will d/c to SNF with LOG)   Jesus Watkins, Jesus Watling Bradley, LCSW 12/22/2016, 2:23 PM

## 2016-12-22 NOTE — Progress Notes (Signed)
Pharmacy Antibiotic Note  Jesus Watkins is a 46 y.o. male admitted on 12/13/2016 with sepsis 2/2 pneumonia vs IDVA.  Pharmacy has been consulted for vancomycin dosing. Today is day 10 of vanc. WBC trended down but slightly elevated at 11.1, afebrile. 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  12/30 vanc trough remains therapeutic at 19 (1gm IV q12h)  Plan: Continue vancomycin 1000mg  IV every 12 hours.  Goal trough 15-20 mcg/mL. Monitor renal function (BMET q72h while on Vancomycin) and clinical progression Vanc trough every 7 days   Height: 5\' 8"  (172.7 cm) Weight: 147 lb (66.7 kg) IBW/kg (Calculated) : 68.4  Temp (24hrs), Avg:98.1 F (36.7 C), Min:97.6 F (36.4 C), Max:98.6 F (37 C)   Recent Labs Lab 12/15/16 0348 12/15/16 0700 12/15/16 1000 12/16/16 0536 12/17/16 0847 12/19/16 0918 12/19/16 1326 12/21/16 2113  WBC 27.4*  --   --  20.7*  --   --  11.1*  --   CREATININE 1.21  --   --  1.14  --  1.08  --   --   LATICACIDVEN  --  0.7 1.1  --   --   --   --   --   VANCOTROUGH  --   --   --   --  17  --   --  19    Estimated Creatinine Clearance: 82.3 mL/min (by C-G formula based on SCr of 1.08 mg/dL).    No Known Allergies  Antimicrobials this admission: Vanc 12/23 >> 6 weeks Zosyn 12/23 >> 12/30   Microbiology results: 12/23BCx: 2/2 bacillus 12/23UCx: NGF 12/24 MRSA PCR: negative 12/29 BCx: pending  Thank you for allowing pharmacy to be a part of this patient's care.  Christoper Fabianaron Kyrielle Urbanski, PharmD, BCPS Clinical pharmacist, pager [phone] 12/22/2016 12:03 AM

## 2016-12-23 LAB — BASIC METABOLIC PANEL
ANION GAP: 9 (ref 5–15)
BUN: 20 mg/dL (ref 6–20)
CHLORIDE: 100 mmol/L — AB (ref 101–111)
CO2: 28 mmol/L (ref 22–32)
Calcium: 9.7 mg/dL (ref 8.9–10.3)
Creatinine, Ser: 1.02 mg/dL (ref 0.61–1.24)
GFR calc Af Amer: >60 mL/min (ref >60)
GLUCOSE: 94 mg/dL (ref 65–99)
POTASSIUM: 4.5 mmol/L (ref 3.5–5.1)
SODIUM: 137 mmol/L (ref 135–145)

## 2016-12-23 MED ORDER — VANCOMYCIN IV (FOR PTA / DISCHARGE USE ONLY)
1000.0000 mg | Freq: Two times a day (BID) | INTRAVENOUS | 0 refills | Status: DC
Start: 2016-12-23 — End: 2017-08-22

## 2016-12-23 MED ORDER — HEPARIN SOD (PORK) LOCK FLUSH 100 UNIT/ML IV SOLN
250.0000 [IU] | INTRAVENOUS | Status: AC | PRN
  Administered 2016-12-23: 250 [IU]

## 2016-12-23 MED ORDER — HYDROXYZINE HCL 25 MG PO TABS: 25.0000 mg | ORAL_TABLET | Freq: Four times a day (QID) | ORAL | Status: DC | PRN

## 2016-12-23 MED ORDER — ASPIRIN 81 MG PO TBEC: 81.0000 mg | DELAYED_RELEASE_TABLET | Freq: Every day | ORAL | Status: DC

## 2016-12-23 MED ORDER — LORAZEPAM 1 MG PO TABS: 1.0000 mg | ORAL_TABLET | Freq: Two times a day (BID) | ORAL | 0 refills | Status: DC | PRN

## 2016-12-23 MED ORDER — METHOCARBAMOL 500 MG PO TABS: 500.0000 mg | ORAL_TABLET | Freq: Three times a day (TID) | ORAL | 0 refills | Status: DC | PRN

## 2016-12-23 MED ORDER — NAPROXEN 500 MG PO TABS: 500.0000 mg | ORAL_TABLET | Freq: Two times a day (BID) | ORAL | Status: DC | PRN

## 2016-12-23 NOTE — Discharge Summary (Signed)
PATIENT DETAILS Name: Jesus Watkins Age: 46 y.o. Sex: male Date of Birth: 1971-07-13 MRN: [id number]. Admitting Physician: Rigoberto Noel, MD PCP:No PCP Per Patient  Admit Date: 12/13/2016 Discharge date: 12/23/2016  Recommendations for Outpatient Follow-up:  1. Follow up at [facility] 2. IV Vancomycin -on day 11 of 42  3. Fax weekly CBC/BMET/Vanco trough levels to [facility] Clinic (see details on med list below) 4. Pull PICC line once IV Antibiotic therapy complete  Admitted From:  Home  Disposition: SNF   Home Health: No  Equipment/Devices: PICC Line 12/22/16  Discharge Condition: Stable  CODE STATUS: FULL CODE  Diet recommendation:   Regular  Brief Summary: See H&P, Labs, Consult and Test reports for all details in brief, Patient is a 46 y.o. 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 See below for further details  Brief Hospital Course: Severe sepsis secondary to HCAP and Bacillus Bacteremia : Sepsis pathophysiology has resolved. Etiology thought to be due to HCAP and Bacillus bacteremia/Endocarditis. Antibiotics narrowed down to Vancomycin,  repeat blood cultures on 12/29 negative so far.   Healthcare associated pneumonia: Non toxic-appearing, leukocytosis has improved. Completed approximately 7 days of  empiric antimicrobial therapy. Remains on IV Vancomycin for possible endocarditis/Bacillus bacteremia.   Acute left temporal lobe infarct, questionable right temporal/parietal lobe infarcts: Thankfully visual loss/blurry vision changes have resolved. CVA thought to be embolic in nature- suspicion for underlying endocarditis given IV drug use. CTA of the neck did not show any significant stenosis/dissection. 2-D echocardiogram with EF around 55-60%, filamentous structure at RA/IVC junction-thought to be either a vegetation  or a Chiari network. TEE on 12/29 did not show any vegetations, no further recommendations from stroke team.  LDL 86, recent A1c in November was 6.0.  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 Although TEE neg for vegetation- given overall clinical picture-high suspicion for endocarditis-furthermore vegetations could have already embolized. Plans are for 6 weeks of IV Vancomycin. Patient willing to go to SNF for antimicrobial therapy-not a candidate for outpatient IV Abx tx due to active IVDA. Since repeat blood cultures on 12/29 negative, after discussion with ID-will order for a PICC line today.See below med list regarding stop date for IV Vancomycin. Please fax weekly CBC/BMET/Vanco trough levels to ID Clinic.  Heroin abuse/withdrawal: No signs of withdrawal at present-methadone has been tapered down-and discontinued on 12/22/16. No longer on clonidine since 12/30.Have counseled extensively regarding cessation of IV Heroin  Acute kidney injury: Resolved, likely due to sepsis/hypotension.  Homelessness  Procedures/Studies: TTE 12/25>> EF 55-60%,Mobile filamentousstructure at the IVC/RA junction - suspicious for promient chiarinetwork (embryologic variant), however, cannot r/o right-sidedendocarditis.  TEE 12/29>>Negative for vegetations  Discharge Diagnoses:  Active Problems:   Septic shock (HCC)   Severe sepsis (HCC)   Heroin withdrawal ([place])   Heroin abuse   Sepsis due to pneumonia ([place])   HCAP (healthcare-associated pneumonia)   Acute renal failure ([place])   Infarction of left temporal lobe [facility])   Discharge Instructions:  Activity:  As tolerated with Full fall precautions use walker/cane & assistance as needed   Discharge Instructions    Home infusion instructions [facility] May follow [place] Dosing Protocol; May administer Cathflo as needed to maintain patency of vascular access device.; Flushing of vascular access device: per [facility]  Protocol: 0.9% NaCl pre/post medica...    Complete by:  As directed  Instructions:  May follow [place] Dosing Protocol   Instructions:  May administer Cathflo as needed to maintain patency of vascular access device.   Instructions:  Flushing of vascular access device: per [facility] Protocol: 0.9% NaCl pre/post medication administration and prn patency; Heparin 100 u/ml, 81m for implanted ports and Heparin 10u/ml, 567mfor all other central venous catheters.   Instructions:  May follow [facility] Anaphylaxis Protocol for First Dose Administration in the home: 0.9% NaCl at 25-50 ml/hr to maintain IV access for protocol meds. Epinephrine 0.3 ml IV/IM PRN and Benadryl 25-50 IV/IM PRN s/s of anaphylaxis.   Instructions:  [facility] Coord[facility] (RN) to assist per patient IV care needs in the home PRN.     Allergies as of 12/23/2016   No Known Allergies     Medication List    STOP taking these medications   oxyCODONE 5 MG immediate release tablet Commonly known as:  Oxy IR/ROXICODONE     TAKE these medications   aspirin 81 MG EC tablet Take 1 tablet (81 mg total) by mouth daily.   folic acid 1 MG tablet Commonly known as:  FOLVITE Take 1 tablet (1 mg total) by mouth daily.   hydrOXYzine 25 MG tablet Commonly known as:  ATARAX/VISTARIL Take 1 tablet (25 mg total) by mouth every 6 (six) hours as needed for anxiety.   LORazepam 1 MG tablet Commonly known as:  ATIVAN Take 1 tablet (1 mg total) by mouth every 12 (twelve) hours as needed for anxiety.   methocarbamol 500 MG tablet Commonly known as:  ROBAXIN Take 1 tablet (500 mg total) by mouth every 8 (eight) hours as needed for muscle spasms.   multivitamin with minerals Tabs tablet Take 1 tablet by mouth daily.   naproxen 500 MG tablet Commonly known as:  NAPROSYN Take 1 tablet (500 mg total) by mouth 2 (two) times daily as needed (aching, pain, or discomfort).   thiamine 100 MG tablet Take 1 tablet (100 mg total) by  mouth daily.   vancomycin IVPB Inject 1,000 mg into the vein every 12 (twelve) hours. Indication:  Pneumonia and presumed endocarditis Last Day of Therapy:  01/24/17 Labs - _0 /02/18 [id number]  Follow-up Information    Carlyle Basques, MD. Schedule an appointment as soon as possible for a visit in 2 week(s).   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 Dizziness. Injected herion this morning. Subsequent encounter. EXAM: CT ANGIOGRAPHY HEAD AND NECK TECHNIQUE: Multidetector CT imaging of the head and neck was performed using the standard protocol during bolus administration of intravenous contrast. Multiplanar CT image reconstructions and MIPs were obtained to evaluate the vascular anatomy. Carotid stenosis measurements (when applicable) are obtained utilizing NASCET criteria, using the distal internal carotid diameter as the denominator. CONTRAST:  75 cc Isovue 370. COMPARISON:  12/13/2016 head CT. FINDINGS: CT HEAD FINDINGS Brain: No intracranial hemorrhage or CT evidence of large acute infarct. No intracranial mass or enhancement.  No hydrocephalus. Vascular: As below Skull: No acute abnormality. Sinuses: Mucosal thickening left sphenoid sinus, maxillary sinuses bilaterally and opacification ethmoid sinus air cells bilaterally. Orbits: No acute abnormality. Review of the MIP images confirms the above findings CTA NECK FINDINGS Aortic arch: 3 vessel arch with mild plaque. Right carotid system: Slightly irregular appearance of the right carotid artery throughout its course with mild narrowing which may represent changes of inflammation from drug abuse or atherosclerotic changes. No evidence of dissection or high-grade stenosis. Left carotid system: Slightly irregular appearance of the left carotid artery throughout its course with mild narrowing which may represent changes of inflammation from drug abuse or atherosclerotic change. No evidence of dissection or high-grade stenosis. Vertebral arteries: Left vertebral artery is dominant. No high-grade stenosis of either vertebral artery. Skeleton: Prominent dental disease. Cervical spondylotic changes most notable C6-7. Other neck: Prominent palatine tonsils may indicate inflammation without discrete mass identified. Direct visualization would be necessary  to exclude subtle mucosal abnormality. Scattered increased number of small lymph nodes possibly reactive in origin. Upper chest: No lung apical lesion. Review of the MIP images confirms the above findings CTA HEAD FINDINGS Anterior circulation: Mild irregularity of medium and large size vessels of the anterior circulation without high-grade stenosis. Mild to moderate branch vessel irregularity. Posterior circulation: Moderate narrowing distal right vertebral artery. Mild irregularity distal left vertebral artery. Mild to moderate narrowing portions of the basilar artery. Posterior cerebral artery mild to moderate branch vessel irregularity and narrowing. Venous sinuses: Patent. Anatomic variants: Fetal contribution to the right posterior cerebral artery. Delayed phase: As above. Review of the MIP images confirms the above findings IMPRESSION: CT HEAD No intracranial hemorrhage or CT evidence of large acute infarct. No intracranial mass or enhancement. Mucosal thickening left sphenoid sinus, maxillary sinuses bilaterally and opacification ethmoid sinus air cells bilaterally. CTA NECK Slightly irregular appearance of the carotid artery throughout its course bilaterally with mild narrowing which may represent changes of inflammation from drug abuse or atherosclerotic changes. No evidence of dissection or high-grade stenosis. Left vertebral artery is dominant. No high-grade stenosis of either vertebral artery. Prominent dental disease. Prominent palatine tonsils may indicate inflammation without discrete mass. Direct visualization would be necessary to exclude mucosa abnormality. Scattered increased number of small lymph nodes possibly reactive in origin. CTA HEAD Mild irregularity of medium and large size vessels of the anterior  circulation without high-grade stenosis. Mild to mild branch vessel irregularity. Moderate narrowing distal right vertebral artery. Mild irregularity distal left vertebral artery. Mild to  moderate narrowing portions of the basilar artery. Posterior cerebral artery mild to moderate branch vessel irregularity and narrowing. Findings may indicate result of vasculopathy related to patient's drug use or secondary to underlying atherosclerotic changes. Electronically Signed   By: Genia Del M.D.   On: 12/13/2016 22:16   Ct Head Wo Contrast  Result Date: 12/13/2016 CLINICAL DATA:  Pt woke up about 2 hrs ago and was blind had a headache He is able to see images now EXAM: CT HEAD WITHOUT CONTRAST TECHNIQUE: Contiguous axial images were obtained from the base of the skull through the vertex without intravenous contrast. COMPARISON:  None. FINDINGS: Brain: No mass lesion, hemorrhage, hydrocephalus, acute infarct, intra-axial, or extra-axial fluid collection. Vascular: No hyperdense vessel or unexpected calcification. Skull: Normal Sinuses/Orbits: Normal orbits and globes. Mucosal thickening of ethmoid air cells and left sphenoid sinus. Clear mastoid air cells. Other:  None IMPRESSION: 1.  No acute intracranial abnormality. 2. Sinus disease. Electronically Signed   By: Abigail Miyamoto M.D.   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 Dizziness. Injected herion this morning. Subsequent encounter. EXAM: CT ANGIOGRAPHY HEAD AND NECK TECHNIQUE: Multidetector CT imaging of the head and neck was performed using the standard protocol during bolus administration of intravenous contrast. Multiplanar CT image reconstructions and MIPs were obtained to evaluate the vascular anatomy. Carotid stenosis measurements (when applicable) are obtained utilizing NASCET criteria, using the distal internal carotid diameter as the denominator. CONTRAST:  75 cc Isovue 370. COMPARISON:  12/13/2016 head CT. FINDINGS: CT HEAD FINDINGS Brain: No intracranial  hemorrhage or CT evidence of large acute infarct. No intracranial mass or enhancement.  No hydrocephalus. Vascular: As below Skull: No acute abnormality. Sinuses: Mucosal thickening left sphenoid sinus, maxillary sinuses bilaterally and opacification ethmoid sinus air cells bilaterally. Orbits: No acute abnormality. Review of the MIP images confirms the above findings CTA NECK FINDINGS Aortic arch: 3 vessel arch with mild plaque. Right carotid system: Slightly irregular appearance of the right carotid artery throughout its course with mild narrowing which may represent changes of inflammation from drug abuse or atherosclerotic changes. No evidence of dissection or high-grade stenosis. Left carotid system: Slightly irregular appearance of the left carotid artery throughout its course with mild narrowing which may represent changes of inflammation from drug abuse or atherosclerotic change. No evidence of dissection or high-grade stenosis. Vertebral arteries: Left vertebral artery is dominant. No high-grade stenosis of either vertebral artery. Skeleton: Prominent dental disease. Cervical spondylotic changes most notable C6-7. Other neck: Prominent palatine tonsils may indicate inflammation without discrete mass identified. Direct visualization would be necessary to exclude subtle mucosal abnormality. Scattered increased number of small lymph nodes possibly reactive in origin. Upper chest: No lung apical lesion. Review of the MIP images confirms the above findings CTA HEAD FINDINGS Anterior circulation: Mild irregularity of medium and large size vessels of the anterior circulation without high-grade stenosis. Mild to moderate branch vessel irregularity. Posterior circulation: Moderate narrowing distal right vertebral artery. Mild irregularity distal left vertebral artery. Mild to moderate narrowing portions of the basilar artery. Posterior cerebral artery mild to moderate branch vessel irregularity and narrowing. Venous  sinuses: Patent. Anatomic variants: Fetal contribution to the  right posterior cerebral artery. Delayed phase: As above. Review of the MIP images confirms the above findings IMPRESSION: CT HEAD No intracranial hemorrhage or CT evidence of large acute infarct. No intracranial mass or enhancement. Mucosal thickening left sphenoid sinus, maxillary sinuses bilaterally and opacification ethmoid sinus air cells bilaterally. CTA NECK Slightly irregular appearance of the carotid artery throughout its course bilaterally with mild narrowing which may represent changes of inflammation from drug abuse or atherosclerotic changes. No evidence of dissection or high-grade stenosis. Left vertebral artery is dominant. No high-grade stenosis of either vertebral artery. Prominent dental disease. Prominent palatine tonsils may indicate inflammation without discrete mass. Direct visualization would be necessary to exclude mucosa abnormality. Scattered increased number of small lymph nodes possibly reactive in origin. CTA HEAD Mild irregularity of medium and large size vessels of the anterior circulation without high-grade stenosis. Mild to mild branch vessel irregularity. Moderate narrowing distal right vertebral artery. Mild irregularity distal left vertebral artery. Mild to moderate narrowing portions of the basilar artery. Posterior cerebral artery mild to moderate branch vessel irregularity and narrowing. Findings may indicate result of vasculopathy related to patient's drug use or secondary to underlying atherosclerotic changes. Electronically Signed   By: Genia Del M.D.   On: 12/13/2016 22:16   Mr Jeri Cos And Wo Contrast  Result Date: 12/14/2016 CLINICAL DATA:  Transient vision loss. Headache. Fever. Hairline abuse. EXAM: MRI HEAD WITHOUT AND WITH CONTRAST TECHNIQUE: Multiplanar, multiecho pulse sequences of the brain and surrounding structures were obtained without and with intravenous contrast. CONTRAST:  30m MULTIHANCE  GADOBENATE DIMEGLUMINE 529 MG/ML IV SOLN COMPARISON:  Head CT/ CTA 12/13/2016 FINDINGS: The study is mildly motion degraded. Brain: There is a 2 mm punctate focus of restricted diffusion involving cortex or subcortical white matter in the posterior left temporal lobe (series 3, image 23). Similar punctate foci of restricted diffusion are questioned in the right temporal lobe (series 3, image 23) and right parietal lobe (series 9, image 7). There is no evidence of intracranial hemorrhage, mass, midline shift, or extra-axial fluid collection. The ventricles and sulci are normal. No significant chronic ischemic changes are identified within limitations of motion artifact. No abnormal enhancement is identified. Vascular: Major intracranial vascular flow voids are preserved. Skull and upper cervical spine: Unremarkable bone marrow signal. Sinuses/Orbits: Unremarkable orbits. Mild mucosal thickening throughout the paranasal sinuses. Small right mastoid effusion. Other: None. IMPRESSION: 1. 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 2. Questionable additional tiny embolic infarcts versus artifact in the right temporal and right parietal lobes. Electronically Signed   By: ALogan BoresM.D.   On: 12/14/2016 08:18   Dg Chest Portable 1 View  Result Date: 12/13/2016 CLINICAL DATA:  Shortness of breath and productive cough for 2 weeks, worsening. EXAM: PORTABLE CHEST 1 VIEW COMPARISON:  PA and lateral chest 11/04/2016. FINDINGS: There is new right lower lobe airspace disease. Left lung is clear. Heart size is normal. No pneumothorax or pleural effusion. Remote left rib fracture is noted. IMPRESSION: Right lower lobe airspace disease most consistent with pneumonia. Recommend followup to clearing. Electronically Signed   By: TInge RiseM.D.   On: 12/13/2016 20:10      TODAY-DAY OF DISCHARGE:  Subjective:   Eileen Nauta today has no headache,no chest abdominal pain,no new weakness tingling or  numbness, feels much better wants to go home today.   Objective:   Blood pressure (!) 105/55, pulse 63, temperature 98.5 F (36.9 C), temperature source Oral, resp. rate 18, height 5'  8" (1.727 m), weight 67.5 kg (148 lb 13 oz), SpO2 97 %.  Intake/Output Summary (Last 24 hours) at 12/23/16 0956 Last data filed at 12/23/16 0824  Gross per 24 hour  Intake              840 ml  Output              650 ml  Net              190 ml   Filed Weights   12/20/16 2105 12/21/16 2053 12/22/16 2158  Weight: 66.5 kg (146 lb 11.2 oz) 66.7 kg (147 lb) 67.5 kg (148 lb 13 oz)    Exam: Awake Alert, Oriented *3, No new F.N deficits, Normal affect [place].AT,PERRAL Supple Neck,No JVD, No cervical lymphadenopathy appriciated.  Symmetrical Chest wall movement, Good air movement bilaterally, CTAB RRR,No Gallops,Rubs or new Murmurs, No Parasternal Heave +ve B.Sounds, Abd Soft, Non tender, No organomegaly appriciated, No rebound -guarding or rigidity. No Cyanosis, Clubbing or edema, No new Rash or bruise   PERTINENT RADIOLOGIC STUDIES: Ct Angio Head W Or Wo Contrast  Result Date: 12/13/2016 CLINICAL DATA:  46 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 Dizziness. Injected herion this morning. Subsequent encounter. EXAM: CT ANGIOGRAPHY HEAD AND NECK TECHNIQUE: Multidetector CT imaging of the head and neck was performed using the standard protocol during bolus administration of intravenous contrast. Multiplanar CT image reconstructions and MIPs were obtained to evaluate the vascular anatomy. Carotid stenosis measurements (when applicable) are obtained utilizing NASCET criteria, using the distal internal carotid diameter as the denominator. CONTRAST:  75 cc Isovue 370. COMPARISON:  12/13/2016 head CT. FINDINGS: CT HEAD FINDINGS Brain: No intracranial hemorrhage or CT evidence of large acute infarct. No intracranial mass or enhancement.  No  hydrocephalus. Vascular: As below Skull: No acute abnormality. Sinuses: Mucosal thickening left sphenoid sinus, maxillary sinuses bilaterally and opacification ethmoid sinus air cells bilaterally. Orbits: No acute abnormality. Review of the MIP images confirms the above findings CTA NECK FINDINGS Aortic arch: 3 vessel arch with mild plaque. Right carotid system: Slightly irregular appearance of the right carotid artery throughout its course with mild narrowing which may represent changes of inflammation from drug abuse or atherosclerotic changes. No evidence of dissection or high-grade stenosis. Left carotid system: Slightly irregular appearance of the left carotid artery throughout its course with mild narrowing which may represent changes of inflammation from drug abuse or atherosclerotic change. No evidence of dissection or high-grade stenosis. Vertebral arteries: Left vertebral artery is dominant. No high-grade stenosis of either vertebral artery. Skeleton: Prominent dental disease. Cervical spondylotic changes most notable C6-7. Other neck: Prominent palatine tonsils may indicate inflammation without discrete mass identified. Direct visualization would be necessary to exclude subtle mucosal abnormality. Scattered increased number of small lymph nodes possibly reactive in origin. Upper chest: No lung apical lesion. Review of the MIP images confirms the above findings CTA HEAD FINDINGS Anterior circulation: Mild irregularity of medium and large size vessels of the anterior circulation without high-grade stenosis. Mild to moderate branch vessel irregularity. Posterior circulation: Moderate narrowing distal right vertebral artery. Mild irregularity distal left vertebral artery. Mild to moderate narrowing portions of the basilar artery. Posterior cerebral artery mild to moderate branch vessel irregularity and narrowing. Venous sinuses: Patent. Anatomic variants: Fetal contribution to the right posterior cerebral  artery. Delayed phase: As above. Review of the MIP images confirms the above findings IMPRESSION: CT HEAD No  intracranial hemorrhage or CT evidence of large acute infarct. No intracranial mass or enhancement. Mucosal thickening left sphenoid sinus, maxillary sinuses bilaterally and opacification ethmoid sinus air cells bilaterally. CTA NECK Slightly irregular appearance of the carotid artery throughout its course bilaterally with mild narrowing which may represent changes of inflammation from drug abuse or atherosclerotic changes. No evidence of dissection or high-grade stenosis. Left vertebral artery is dominant. No high-grade stenosis of either vertebral artery. Prominent dental disease. Prominent palatine tonsils may indicate inflammation without discrete mass. Direct visualization would be necessary to exclude mucosa abnormality. Scattered increased number of small lymph nodes possibly reactive in origin. CTA HEAD Mild irregularity of medium and large size vessels of the anterior circulation without high-grade stenosis. Mild to mild branch vessel irregularity. Moderate narrowing distal right vertebral artery. Mild irregularity distal left vertebral artery. Mild to moderate narrowing portions of the basilar artery. Posterior cerebral artery mild to moderate branch vessel irregularity and narrowing. Findings may indicate result of vasculopathy related to patient's drug use or secondary to underlying atherosclerotic changes. Electronically Signed   By: Genia Del M.D.   On: 12/13/2016 22:16   Ct Head Wo Contrast  Result Date: 12/13/2016 CLINICAL DATA:  Pt woke up about 2 hrs ago and was blind had a headache He is able to see images now EXAM: CT HEAD WITHOUT CONTRAST TECHNIQUE: Contiguous axial images were obtained from the base of the skull through the vertex without intravenous contrast. COMPARISON:  None. FINDINGS: Brain: No mass lesion, hemorrhage, hydrocephalus, acute infarct, intra-axial, or  extra-axial fluid collection. Vascular: No hyperdense vessel or unexpected calcification. Skull: Normal Sinuses/Orbits: Normal orbits and globes. Mucosal thickening of ethmoid air cells and left sphenoid sinus. Clear mastoid air cells. Other:  None IMPRESSION: 1.  No acute intracranial abnormality. 2. Sinus disease. Electronically Signed   By: Abigail Miyamoto M.D.   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 Dizziness. Injected herion this morning. Subsequent encounter. EXAM: CT ANGIOGRAPHY HEAD AND NECK TECHNIQUE: Multidetector CT imaging of the head and neck was performed using the standard protocol during bolus administration of intravenous contrast. Multiplanar CT image reconstructions and MIPs were obtained to evaluate the vascular anatomy. Carotid stenosis measurements (when applicable) are obtained utilizing NASCET criteria, using the distal internal carotid diameter as the denominator. CONTRAST:  75 cc Isovue 370. COMPARISON:  12/13/2016 head CT. FINDINGS: CT HEAD FINDINGS Brain: No intracranial hemorrhage or CT evidence of large acute infarct. No intracranial mass or enhancement.  No hydrocephalus. Vascular: As below Skull: No acute abnormality. Sinuses: Mucosal thickening left sphenoid sinus, maxillary sinuses bilaterally and opacification ethmoid sinus air cells bilaterally. Orbits: No acute abnormality. Review of the MIP images confirms the above findings CTA NECK FINDINGS Aortic arch: 3 vessel arch with mild plaque. Right carotid system: Slightly irregular appearance of the right carotid artery throughout its course with mild narrowing which may represent changes of inflammation from drug abuse or atherosclerotic changes. No evidence of dissection or high-grade stenosis. Left carotid system: Slightly irregular appearance of the left  carotid artery throughout its course with mild narrowing which may represent changes of inflammation from drug abuse or atherosclerotic change. No evidence of dissection or high-grade stenosis. Vertebral arteries: Left vertebral artery is dominant. No high-grade stenosis of either vertebral artery. Skeleton: Prominent dental disease. Cervical spondylotic changes  most notable C6-7. Other neck: Prominent palatine tonsils may indicate inflammation without discrete mass identified. Direct visualization would be necessary to exclude subtle mucosal abnormality. Scattered increased number of small lymph nodes possibly reactive in origin. Upper chest: No lung apical lesion. Review of the MIP images confirms the above findings CTA HEAD FINDINGS Anterior circulation: Mild irregularity of medium and large size vessels of the anterior circulation without high-grade stenosis. Mild to moderate branch vessel irregularity. Posterior circulation: Moderate narrowing distal right vertebral artery. Mild irregularity distal left vertebral artery. Mild to moderate narrowing portions of the basilar artery. Posterior cerebral artery mild to moderate branch vessel irregularity and narrowing. Venous sinuses: Patent. Anatomic variants: Fetal contribution to the right posterior cerebral artery. Delayed phase: As above. Review of the MIP images confirms the above findings IMPRESSION: CT HEAD No intracranial hemorrhage or CT evidence of large acute infarct. No intracranial mass or enhancement. Mucosal thickening left sphenoid sinus, maxillary sinuses bilaterally and opacification ethmoid sinus air cells bilaterally. CTA NECK Slightly irregular appearance of the carotid artery throughout its course bilaterally with mild narrowing which may represent changes of inflammation from drug abuse or atherosclerotic changes. No evidence of dissection or high-grade stenosis. Left vertebral artery is dominant. No high-grade stenosis of either vertebral  artery. Prominent dental disease. Prominent palatine tonsils may indicate inflammation without discrete mass. Direct visualization would be necessary to exclude mucosa abnormality. Scattered increased number of small lymph nodes possibly reactive in origin. CTA HEAD Mild irregularity of medium and large size vessels of the anterior circulation without high-grade stenosis. Mild to mild branch vessel irregularity. Moderate narrowing distal right vertebral artery. Mild irregularity distal left vertebral artery. Mild to moderate narrowing portions of the basilar artery. Posterior cerebral artery mild to moderate branch vessel irregularity and narrowing. Findings may indicate result of vasculopathy related to patient's drug use or secondary to underlying atherosclerotic changes. Electronically Signed   By: Genia Del M.D.   On: 12/13/2016 22:16   Mr Jeri Cos And Wo Contrast  Result Date: 12/14/2016 CLINICAL DATA:  Transient vision loss. Headache. Fever. Hairline abuse. EXAM: MRI HEAD WITHOUT AND WITH CONTRAST TECHNIQUE: Multiplanar, multiecho pulse sequences of the brain and surrounding structures were obtained without and with intravenous contrast. CONTRAST:  64m MULTIHANCE GADOBENATE DIMEGLUMINE 529 MG/ML IV SOLN COMPARISON:  Head CT/ CTA 12/13/2016 FINDINGS: The study is mildly motion degraded. Brain: There is a 2 mm punctate focus of restricted diffusion involving cortex or subcortical white matter in the posterior left temporal lobe (series 3, image 23). Similar punctate foci of restricted diffusion are questioned in the right temporal lobe (series 3, image 23) and right parietal lobe (series 9, image 7). There is no evidence of intracranial hemorrhage, mass, midline shift, or extra-axial fluid collection. The ventricles and sulci are normal. No significant chronic ischemic changes are identified within limitations of motion artifact. No abnormal enhancement is identified. Vascular: Major intracranial  vascular flow voids are preserved. Skull and upper cervical spine: Unremarkable bone marrow signal. Sinuses/Orbits: Unremarkable orbits. Mild mucosal thickening throughout the paranasal sinuses. Small right mastoid effusion. Other: None. IMPRESSION: 1. 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 2. Questionable additional tiny embolic infarcts versus artifact in the right temporal and right parietal lobes. Electronically Signed   By: ALogan BoresM.D.   On: 12/14/2016 08:18   Dg Chest Portable 1 View  Result Date: 12/13/2016 CLINICAL DATA:  Shortness of breath and productive cough for 2 weeks, worsening. EXAM: PORTABLE CHEST 1 VIEW COMPARISON:  PA and lateral chest 11/04/2016. FINDINGS: There is new right lower lobe airspace disease. Left lung is clear. Heart size is normal. No pneumothorax or pleural effusion. Remote left rib fracture is noted. IMPRESSION: Right lower lobe airspace disease most consistent with pneumonia. Recommend followup to clearing. Electronically Signed   By: Inge Rise M.D.   On: 12/13/2016 20:10     PERTINENT LAB RESULTS: CBC: No results for input(s): WBC, HGB, HCT, PLT in the last 72 hours. CMET CMP     Component Value Date/Time   NA 137 12/23/2016 0443   K 4.5 12/23/2016 0443   CL 100 (L) 12/23/2016 0443   CO2 28 12/23/2016 0443   GLUCOSE 94 12/23/2016 0443   BUN 20 12/23/2016 0443   CREATININE 1.02 12/23/2016 0443   CALCIUM 9.7 12/23/2016 0443   PROT 6.4 (L) 12/16/2016 0536   ALBUMIN 2.7 (L) 12/16/2016 0536   AST 20 12/16/2016 0536   ALT 23 12/16/2016 0536   ALKPHOS 147 (H) 12/16/2016 0536   BILITOT 0.4 12/16/2016 0536   GFRNONAA >60 12/23/2016 0443   GFRAA >60 12/23/2016 0443    GFR Estimated Creatinine Clearance: 88.2 mL/min (by C-G formula based on SCr of 1.02 mg/dL). No results for input(s): LIPASE, AMYLASE in the last 72 hours. No results for input(s): CKTOTAL, CKMB, CKMBINDEX, TROPONINI in the last 72 hours. Invalid input(s):  POCBNP No results for input(s): DDIMER in the last 72 hours. No results for input(s): HGBA1C in the last 72 hours. No results for input(s): CHOL, HDL, LDLCALC, TRIG, CHOLHDL, LDLDIRECT in the last 72 hours. No results for input(s): TSH, T4TOTAL, T3FREE, THYROIDAB in the last 72 hours.  Invalid input(s): FREET3 No results for input(s): VITAMINB12, FOLATE, FERRITIN, TIBC, IRON, RETICCTPCT in the last 72 hours. Coags: No results for input(s): INR in the last 72 hours.  Invalid input(s): PT Microbiology: Recent Results (from the past 240 hour(s))  Blood culture (routine x 2)     Status: Abnormal   Collection Time: 12/13/16  7:20 PM  Result Value Ref Range Status   Specimen Description BLOOD RIGHT UPPER ARM  Final   Special Requests BOTTLES DRAWN AEROBIC AND ANAEROBIC 5CC EA  Final   Culture  Setup Time   Final    GRAM POSITIVE RODS ANAEROBIC BOTTLE ONLY CRITICAL RESULT CALLED TO, READ BACK BY AND VERIFIED WITH: AElta Guadeloupe.D. 15:00 12/17/16 ([email or web address])    Culture (A)  Final    BACILLUS SPECIES Standardized susceptibility testing for this organism is not available.    Report Status 12/19/2016 FINAL  Final  Blood Culture ID Panel (Reflexed)     Status: None   Collection Time: 12/13/16  7:20 PM  Result Value Ref Range Status   Enterococcus species NOT DETECTED NOT DETECTED Final   Listeria monocytogenes NOT DETECTED NOT DETECTED Final   Staphylococcus species NOT DETECTED NOT DETECTED Final   Staphylococcus aureus NOT DETECTED NOT DETECTED Final   Streptococcus species NOT DETECTED NOT DETECTED Final   Streptococcus agalactiae NOT DETECTED NOT DETECTED Final   Streptococcus pneumoniae NOT DETECTED NOT DETECTED Final   Streptococcus pyogenes NOT DETECTED NOT DETECTED Final   Acinetobacter baumannii NOT DETECTED NOT DETECTED Final   Enterobacteriaceae species NOT DETECTED NOT DETECTED Final   Enterobacter cloacae complex NOT DETECTED NOT DETECTED Final   Escherichia coli NOT  DETECTED NOT DETECTED Final   Klebsiella oxytoca NOT DETECTED NOT DETECTED Final   Klebsiella pneumoniae NOT DETECTED NOT DETECTED Final   Proteus species NOT DETECTED  NOT DETECTED Final   Serratia marcescens NOT DETECTED NOT DETECTED Final   Haemophilus influenzae NOT DETECTED NOT DETECTED Final   Neisseria meningitidis NOT DETECTED NOT DETECTED Final   Pseudomonas aeruginosa NOT DETECTED NOT DETECTED Final   Candida albicans NOT DETECTED NOT DETECTED Final   Candida glabrata NOT DETECTED NOT DETECTED Final   Candida krusei NOT DETECTED NOT DETECTED Final   Candida parapsilosis NOT DETECTED NOT DETECTED Final   Candida tropicalis NOT DETECTED NOT DETECTED Final  Blood culture (routine x 2)     Status: Abnormal   Collection Time: 12/13/16  7:30 PM  Result Value Ref Range Status   Specimen Description BLOOD LEFT HAND  Final   Special Requests BOTTLES DRAWN AEROBIC AND ANAEROBIC 5CC EA  Final   Culture  Setup Time   Final    GRAM POSITIVE RODS ANAEROBIC BOTTLE ONLY CRITICAL VALUE NOTED.  VALUE IS CONSISTENT WITH PREVIOUSLY REPORTED AND CALLED VALUE.    Culture (A)  Final    BACILLUS SPECIES Standardized susceptibility testing for this organism is not available.    Report Status 12/20/2016 FINAL  Final  Urine culture     Status: None   Collection Time: 12/13/16 11:10 PM  Result Value Ref Range Status   Specimen Description URINE, CATHETERIZED  Final   Special Requests NONE  Final   Culture NO GROWTH  Final   Report Status 12/15/2016 FINAL  Final  MRSA PCR Screening     Status: None   Collection Time: 12/14/16 12:53 AM  Result Value Ref Range Status   MRSA by PCR NEGATIVE NEGATIVE Final    Comment:        The GeneXpert MRSA Assay (FDA approved for NASAL specimens only), is one component of a comprehensive MRSA colonization surveillance program. It is not intended to diagnose MRSA infection nor to guide or monitor treatment for MRSA infections.   Culture, blood (routine x  2)     Status: None (Preliminary result)   Collection Time: 12/19/16  1:30 PM  Result Value Ref Range Status   Specimen Description BLOOD LEFT ARM  Final   Special Requests BOTTLES DRAWN AEROBIC ONLY 5CC  Final   Culture NO GROWTH 3 DAYS  Final   Report Status PENDING  Incomplete  Culture, blood (routine x 2)     Status: None (Preliminary result)   Collection Time: 12/19/16  1:35 PM  Result Value Ref Range Status   Specimen Description BLOOD LEFT ANTECUBITAL  Final   Special Requests BOTTLES DRAWN AEROBIC ONLY 5CC  Final   Culture NO GROWTH 3 DAYS  Final   Report Status PENDING  Incomplete    FURTHER DISCHARGE INSTRUCTIONS:  Get Medicines reviewed and adjusted: Please take all your medications with you for your next visit with your Primary MD  Laboratory/radiological data: Please request your Primary MD to go over all hospital tests and procedure/radiological results at the follow up, please ask your Primary MD to get all Hospital records sent to his/her office.  In some cases, they will be blood work, cultures and biopsy results pending at the time of your discharge. Please request that your primary care M.D. goes through all the records of your hospital data and follows up on these results.  Also Note the following: If you experience worsening of your admission symptoms, develop shortness of breath, life threatening emergency, suicidal or homicidal thoughts you must seek medical attention immediately by calling 911 or calling your MD immediately  if symptoms less  severe.  You must read complete instructions/literature along with all the possible adverse reactions/side effects for all the Medicines you take and that have been prescribed to you. Take any new Medicines after you have completely understood and accpet all the possible adverse reactions/side effects.   Do not drive when taking Pain medications or sleeping medications (Benzodaizepines)  Do not take more than prescribed  Pain, Sleep and Anxiety Medications. It is not advisable to combine anxiety,sleep and pain medications without talking with your primary care practitioner  Special Instructions: If you have smoked or chewed Tobacco  in the last 2 yrs please stop smoking, stop any regular Alcohol  and or any Recreational drug use.  Wear Seat belts while driving.  Please note: You were cared for by a hospitalist during your hospital stay. Once you are discharged, your primary care physician will handle any further medical issues. Please note that NO REFILLS for any discharge medications will be authorized once you are discharged, as it is imperative that you return to your primary care physician (or establish a relationship with a primary care physician if you do not have one) for your post hospital discharge needs so that they can reassess your need for medications and monitor your lab values.  Total Time spent coordinating discharge including counseling, education and face to face time equals 45 minutes.  SignedOren Binet 12/23/2016 9:56 AM

## 2016-12-23 NOTE — Clinical Social Work Placement (Signed)
   CLINICAL SOCIAL WORK PLACEMENT  NOTE 12/23/16 - DISCHARGED TO [facility]IA [facility]  Date:  12/23/2016  Patient Details  Name: Jesus Watkins MRN: [id number] Date of Birth: 10/19/1971  Clinical Social Work is seeking post-discharge placement for this patient at the Skilled  Nursing Facility level of care (*CSW will initial, date and re-position this form in  chart as items are completed):  No (Patient no insurance and Letter of Guarantee)   Patient/family provided with [facility] Clinical Social Work Department's list of facilities offering this level of care within the geographic area requested by the patient (or if unable, by the patient's family).  No (Patient no insurance and LOG)   Patient/family informed of their freedom to choose among providers that offer the needed level of care, that participate in Medicare, Medicaid or managed care program needed by the patient, have an available bed and are willing to accept the patient.  No   Patient/family informed of [facility]'s ownership interest in [facility] and [facility], as well as of the fact that they are under no obligation to receive care at these facilities.  PASRR submitted to EDS on 12/22/16     PASRR number received on 12/22/16     Existing PASRR number confirmed on       FL2 transmitted to all facilities in geographic area requested by pt/family on 12/22/16     FL2 transmitted to all facilities within larger geographic area on       Patient informed that his/her managed care company has contracts with or will negotiate with certain facilities, including the following:        Yes   Patient/family informed of bed offers received.  [facility]     Physician recommends and patient chooses bed at      [facility] 12/23/16.  [facility]     Patient family notified on 12/23/16 of transfer.  Name of family member notified:   (No family per patient)     PHYSICIAN       Additional Comment:    _______________________________________________ Cristobal Goldmannrawford, Winola Drum Bradley, LCSW 12/23/2016, 7:02 PM

## 2016-12-23 NOTE — Progress Notes (Signed)
Patient Discharge: Disposition: [facility]. Gave report to the nurse Lupita LeashDonna and answered all her questions and concerns. IV: PICC line will be maintained at the nursing home for prolonged IV ABT, flushed and capped by the IV team. Telemetry: N/A Transportation: Patient transported out of the unit in w/c accompanied by the staff. Patient transported with private transportation [facility]).   Belongings: Patient took all his belongings with him.

## 2016-12-24 LAB — CULTURE, BLOOD (ROUTINE X 2)
CULTURE: NO GROWTH
CULTURE: NO GROWTH

## 2017-01-20 ENCOUNTER — Ambulatory Visit (INDEPENDENT_AMBULATORY_CARE_PROVIDER_SITE_OTHER): Payer: Self-pay | Admitting: Internal Medicine

## 2017-01-20 ENCOUNTER — Encounter: Payer: Self-pay | Admitting: Internal Medicine

## 2017-01-20 VITALS — BP 144/87 | HR 86 | Temp 98.6°F | Wt 165.0 lb

## 2017-01-20 DIAGNOSIS — I330 Acute and subacute infective endocarditis: Secondary | ICD-10-CM

## 2017-01-20 DIAGNOSIS — F19130 Other psychoactive substance abuse with withdrawal, uncomplicated: Secondary | ICD-10-CM

## 2017-01-20 DIAGNOSIS — F19230 Other psychoactive substance dependence with withdrawal, uncomplicated: Secondary | ICD-10-CM

## 2017-01-20 NOTE — Progress Notes (Signed)
RFV: hospital follow up  Patient ID: Jesus Watkins, male   DOB: July 31, 1971, 46 y.o.   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 TEE was negative, but he was discharged on 6 wk of IV vanco for management of presumed endocarditis. He was found to be bacteremia in setting of recent IV drug use. He has detoxed during his hospitalization and SNF placement to receive IV abtx. He is worried about on not having any plans for going to rehab. Still has urges but wants to get treatment, preferably residential  Outpatient Encounter Prescriptions as of 01/20/2017  Medication Sig  . aspirin EC 81 MG EC tablet Take 1 tablet (81 mg total) by mouth daily.  . folic acid (FOLVITE) 1 MG tablet Take 1 tablet (1 mg total) by mouth daily.  . hydrOXYzine (ATARAX/VISTARIL) 25 MG tablet Take 1 tablet (25 mg total) by mouth every 6 (six) hours as needed for anxiety.  Marland Kitchen LORazepam (ATIVAN) 1 MG tablet Take 1 tablet (1 mg total) by mouth every 12 (twelve) hours as needed for anxiety.  . methocarbamol (ROBAXIN) 500 MG tablet Take 1 tablet (500 mg total) by mouth every 8 (eight) hours as needed for muscle spasms.  . Multiple Vitamin (MULTIVITAMIN WITH MINERALS) TABS tablet Take 1 tablet by mouth daily.  . naproxen (NAPROSYN) 500 MG tablet Take 1 tablet (500 mg total) by mouth 2 (two) times daily as needed (aching, pain, or discomfort).  . thiamine 100 MG tablet Take 1 tablet (100 mg total) by mouth daily.  . vancomycin IVPB Inject 1,000 mg into the vein every 12 (twelve) hours. Indication:  Pneumonia and presumed endocarditis Last Day of Therapy:  01/24/17 Labs - Sunday/Monday:  CBC/D, BMP, and vancomycin trough. Labs - Thursday:  BMP and vancomycin trough Labs - Every other week:  ESR and CRP   No facility-administered encounter medications on file as of 01/20/2017.      Patient Active  Problem List   Diagnosis Date Noted  . Heroin withdrawal ([place])   . Heroin abuse   . Sepsis due to pneumonia ([place])   . HCAP (healthcare-associated pneumonia)   . Acute renal failure ([place])   . 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 Severe sepsis ([place]) 12/14/2016  . Septic shock ([place]) 12/13/2016  . Abscess of antecubital fossa 11/05/2016  . Hyperglycemia 11/05/2016  . IVDA (intravenous drug abuse) complicating pregnancy 10/07/5101  . Leukocytosis 11/05/2016  . Essential hypertension 11/05/2016  . Homeless 11/05/2016  . Cellulitis      Health Maintenance Due  Topic Date Due  . HIV Screening  03/27/1987  . TETANUS/TDAP  03/27/1991  . INFLUENZA VACCINE  07/22/2016     Review of Systems +anxiety surrounding being discharged after abtx completion. No fever, chills, nightsweats, cough. Diarrhea. Rash. Or thrush. 10 point ros is otherwise negative Physical Exam   BP (!) 144/87   Pulse 86   Temp 98.6 F (37 C) (Oral)   Wt 165 lb (74.8 kg)   BMI 25.09 kg/m   Physical Exam  Constitutional: He is oriented to person, place, and time. He appears well-developed and well-nourished. No distress.  HENT:  Mouth/Throat: Oropharynx is clear and moist. No oropharyngeal exudate.  Cardiovascular: Normal rate, regular rhythm and normal heart sounds. Exam reveals no gallop and no friction rub.  No murmur heard.  Pulmonary/Chest: Effort normal and breath  sounds normal. No respiratory distress. He has no wheezes.  Abdominal: Soft. Bowel sounds are normal. He exhibits no distension. There is no tenderness.  Lymphadenopathy:  He has no cervical adenopathy.  Neurological: He is alert and oriented to person, place, and time.  Skin: Skin is warm and dry. No rash noted. No erythema. picc line is c/d/i Psychiatric: He has a normal mood and affect. His behavior is normal.    Lab Results  Component Value Date   LABRPR Non Reactive 12/15/2016    CBC Lab Results  Component Value Date   WBC  11.1 (H) 12/19/2016   RBC 4.06 (L) 12/19/2016   HGB 11.1 (L) 12/19/2016   HCT 34.7 (L) 12/19/2016   PLT  12/19/2016    PLATELET CLUMPS NOTED ON SMEAR, COUNT APPEARS ADEQUATE   MCV 85.5 12/19/2016   MCH 27.3 12/19/2016   MCHC 32.0 12/19/2016   RDW 14.6 12/19/2016   LYMPHSABS 3.1 12/19/2016   MONOABS 0.6 12/19/2016   EOSABS 1.0 (H) 12/19/2016    BMET Lab Results  Component Value Date   NA 137 12/23/2016   K 4.5 12/23/2016   CL 100 (L) 12/23/2016   CO2 28 12/23/2016   GLUCOSE 94 12/23/2016   BUN 20 12/23/2016   CREATININE 1.02 12/23/2016   CALCIUM 9.7 12/23/2016   GFRNONAA >60 12/23/2016   GFRAA >60 12/23/2016      Assessment and Plan  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 He will be finihsing abtx on feb 5th. Can pull picc line after abtx completion  Substance abuse = he has finished physiologic withdrawal process but still needs inpatient rehab to help curb addiction. Gave rehab referral

## 2017-07-28 ENCOUNTER — Emergency Department (HOSPITAL_COMMUNITY)
Admission: EM | Admit: 2017-07-28 | Discharge: 2017-07-28 | Disposition: A | Discharge status: Home / Self Care | Payer: Self-pay | Attending: Emergency Medicine | Admitting: Emergency Medicine

## 2017-07-28 ENCOUNTER — Emergency Department (HOSPITAL_COMMUNITY): Payer: Self-pay

## 2017-07-28 ENCOUNTER — Encounter (HOSPITAL_COMMUNITY): Payer: Self-pay | Admitting: Emergency Medicine

## 2017-07-28 DIAGNOSIS — M79605 Pain in left leg: Secondary | ICD-10-CM | POA: Insufficient documentation

## 2017-07-28 DIAGNOSIS — Z7982 Long term (current) use of aspirin: Secondary | ICD-10-CM | POA: Insufficient documentation

## 2017-07-28 DIAGNOSIS — W19XXXA Unspecified fall, initial encounter: Secondary | ICD-10-CM | POA: Insufficient documentation

## 2017-07-28 DIAGNOSIS — F17210 Nicotine dependence, cigarettes, uncomplicated: Secondary | ICD-10-CM | POA: Insufficient documentation

## 2017-07-28 DIAGNOSIS — I10 Essential (primary) hypertension: Secondary | ICD-10-CM | POA: Insufficient documentation

## 2017-07-28 MED ORDER — KETOROLAC TROMETHAMINE 30 MG/ML IJ SOLN
60.0000 mg | Freq: Once | INTRAMUSCULAR | Status: AC
  Administered 2017-07-28: 60 mg via INTRAMUSCULAR
  Filled 2017-07-28: qty 2

## 2017-07-28 MED ORDER — ACETAMINOPHEN 325 MG PO TABS
650.0000 mg | ORAL_TABLET | Freq: Once | ORAL | Status: AC
  Administered 2017-07-28: 650 mg via ORAL
  Filled 2017-07-28: qty 2

## 2017-07-28 MED ORDER — IBUPROFEN 800 MG PO TABS: 800.0000 mg | ORAL_TABLET | Freq: Three times a day (TID) | ORAL | 0 refills | Status: DC | PRN

## 2017-07-28 MED ORDER — METAXALONE 800 MG PO TABS: 800.0000 mg | ORAL_TABLET | Freq: Three times a day (TID) | ORAL | 0 refills | Status: DC

## 2017-07-28 NOTE — ED Triage Notes (Signed)
Per EMS: Pt fell 6 ft from a ladder on his left hip 7 days ago.  VSS, A&Ox4.  Pt has Hx of stroke but no deficits.  Pt able to walk to ambulance.  Pt called 911 because he could not find a ride to ED.

## 2017-07-28 NOTE — Discharge Instructions (Signed)
Your x-rays were negative.  Ice and elevate your leg.

## 2017-07-28 NOTE — ED Notes (Signed)
Pt given bus pass home

## 2017-07-30 NOTE — ED Provider Notes (Signed)
[facility] DEPT Provider Note   CSN: [id number] Arrival date & time: 07/28/17  [place]     History   Chief Complaint Chief Complaint  Patient presents with  . Fall    HPI Jesus Watkins is a 46 y.o. male.  HPI Patient presents to the emergency department with left anterior thigh pain that started yesterday.  The patient states that he fell week ago, landing on his right buttocks and hip, but is having left hip and upper leg pain.  The patient was able to ambulate to the ambulance.  Per EMS, the patient states that he is unable to walk due to pain.  Patient states that he did not take any medications prior to arrival.  Patient denies shortness breath, nausea, vomiting, abdominal pain, weakness, dizziness, headache, blurred vision, back pain, numbness, near-syncope or syncope Past Medical History:  Diagnosis Date  . Acid reflux   . Essential hypertension   . Hyperglycemia 10/2016    Patient Active Problem List   Diagnosis Date Noted  . Heroin withdrawal ([place])   . Heroin abuse   . Sepsis due to pneumonia ([place])   . HCAP (healthcare-associated pneumonia)   . Acute renal failure ([place])   . 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 Severe sepsis ([place]) 12/14/2016  . Septic shock ([place]) 12/13/2016  . Abscess of antecubital fossa 11/05/2016  . Hyperglycemia 11/05/2016  . IVDA (intravenous drug abuse) complicating pregnancy 40/98/1191  . Leukocytosis 11/05/2016  . Essential hypertension 11/05/2016  . Homeless 11/05/2016  . Cellulitis     Past Surgical History:  Procedure Laterality Date  . bilateral arm abscesses    . DENTAL SURGERY    . I&D EXTREMITY Bilateral 11/05/2016   Procedure: IRRIGATION AND DEBRIDEMENT EXTREMITY;  Surgeon: Roseanne Kaufman, MD;  Location: [facility];  Service: Orthopedics;  Laterality: Bilateral;  . I&D EXTREMITY Bilateral 11/06/2016   Procedure: IRRIGATION AND DEBRIDEMENT AND REPAIR OF ASSOCIATED STRUCTURES RIGHT AND LEFT ARMS;  Surgeon: Roseanne Kaufman, MD;   Location: [place];  Service: Orthopedics;  Laterality: Bilateral;  . TEE WITHOUT CARDIOVERSION N/A 12/19/2016   Procedure: TRANSESOPHAGEAL ECHOCARDIOGRAM (TEE);  Surgeon: Sanda Klein, MD;  Location: [facility] ENDOSCOPY;  Service: Cardiovascular;  Laterality: N/A;       Home Medications    Prior to Admission medications   Medication Sig Start Date End Date Taking? Authorizing Provider  aspirin EC 81 MG EC tablet Take 1 tablet (81 mg total) by mouth daily. 12/23/16   Ghimire, Henreitta Leber, MD  folic acid (FOLVITE) 1 MG tablet Take 1 tablet (1 mg total) by mouth daily. 11/12/16   Mariel Aloe, MD  hydrOXYzine (ATARAX/VISTARIL) 25 MG tablet Take 1 tablet (25 mg total) by mouth every 6 (six) hours as needed for anxiety. 12/23/16   Ghimire, Henreitta Leber, MD  ibuprofen (ADVIL,MOTRIN) 800 MG tablet Take 1 tablet (800 mg total) by mouth every 8 (eight) hours as needed. 07/28/17   Allan Bacigalupi, Harrell Gave, PA-C  LORazepam (ATIVAN) 1 MG tablet Take 1 tablet (1 mg total) by mouth every 12 (twelve) hours as needed for anxiety. 12/23/16   Ghimire, Henreitta Leber, MD  metaxalone (SKELAXIN) 800 MG tablet Take 1 tablet (800 mg total) by mouth 3 (three) times daily. 07/28/17   Neiko Trivedi, Harrell Gave, PA-C  methocarbamol (ROBAXIN) 500 MG tablet Take 1 tablet (500 mg total) by mouth every 8 (eight) hours as needed for muscle spasms. 12/23/16   Ghimire, Henreitta Leber, MD  Multiple Vitamin (MULTIVITAMIN WITH MINERALS) TABS tablet Take  1 tablet by mouth daily. 11/12/16   Mariel Aloe, MD  naproxen (NAPROSYN) 500 MG tablet Take 1 tablet (500 mg total) by mouth 2 (two) times daily as needed (aching, pain, or discomfort). 12/23/16   Ghimire, Henreitta Leber, MD  thiamine 100 MG tablet Take 1 tablet (100 mg total) by mouth daily. 11/12/16   Mariel Aloe, MD  vancomycin IVPB Inject 1,000 mg into the vein every 12 (twelve) hours. Indication:  Pneumonia and presumed endocarditis Last Day of Therapy:  01/24/17 Labs - Sunday/Monday:  CBC/D, BMP, and vancomycin  trough. Labs - Thursday:  BMP and vancomycin trough Labs - Every other week:  ESR and CRP 12/23/16   Ghimire, Henreitta Leber, MD    Family History Family History  Problem Relation Age of Onset  . Family history unknown: Yes    Social History Social History  Substance Use Topics  . Smoking status: Current Every Day Smoker    Packs/day: 0.50    Types: Cigarettes  . Smokeless tobacco: Never Used  . Alcohol use No     Allergies   Patient has no known allergies.   Review of Systems Review of Systems All other systems negative except as documented in the HPI. All pertinent positives and negatives as reviewed in the HPI.  Physical Exam Updated Vital Signs BP (!) 152/78   Pulse 80   Temp 98.7 F (37.1 C) (Oral)   Resp 16   Ht _0  (1.702 m)   Wt 74.8 kg (165 lb)   SpO2 100%   BMI 25.84 kg/m   Physical Exam  Constitutional: He is oriented to person, place, and time. He appears well-developed and well-nourished. No distress.  HENT:  Head: Normocephalic and atraumatic.  Eyes: Pupils are equal, round, and reactive to light.  Pulmonary/Chest: Effort normal.  Musculoskeletal:       Left upper leg: He exhibits tenderness. He exhibits no bony tenderness, no swelling, no edema and no deformity.       Legs: Neurological: He is alert and oriented to person, place, and time.  Skin: Skin is warm and dry.  Psychiatric: He has a normal mood and affect.  Nursing note and vitals reviewed.    ED Treatments / Results  Labs (all labs ordered are listed, but only abnormal results are displayed) Labs Reviewed - No data to display  EKG  EKG Interpretation None       Radiology Dg Knee Complete 4 Views Left  Result Date: 07/28/2017 CLINICAL DATA:  Acute onset of left knee pain after falling off ladder 1 week ago. Initial encounter. EXAM: LEFT KNEE - COMPLETE 4+ VIEW COMPARISON:  None. FINDINGS: There is no evidence of fracture or dislocation. The joint spaces are preserved. No  significant degenerative change is seen; the patellofemoral joint is grossly unremarkable in appearance. No significant joint effusion is seen. The visualized soft tissues are normal in appearance. IMPRESSION: No evidence of fracture or dislocation Electronically Signed   By: Garald Balding M.D.   On: 07/28/2017 21:26   Dg Hip Unilat With Pelvis 2-3 Views Left  Result Date: 07/28/2017 CLINICAL DATA:  Acute onset of left hip pain after falling off ladder. Initial encounter. EXAM: DG HIP (WITH OR WITHOUT PELVIS) 2-3V LEFT COMPARISON:  None. FINDINGS: There is no evidence of fracture or dislocation. Both femoral heads are seated normally within their respective acetabula. The proximal left femur appears intact. No significant degenerative change is appreciated. The sacroiliac joints are unremarkable in appearance. The  visualized bowel gas pattern is grossly unremarkable in appearance. IMPRESSION: No evidence of fracture or dislocation. Electronically Signed   By: Garald Balding M.D.   On: 07/28/2017 21:25    Procedures Procedures (including critical care time)  Medications Ordered in ED Medications  ketorolac (TORADOL) 30 MG/ML injection 60 mg (60 mg Intramuscular Given 07/28/17 2019)  acetaminophen (TYLENOL) tablet 650 mg (650 mg Oral Given 07/28/17 2210)     Initial Impression / Assessment and Plan / ED Course  I have reviewed the triage vital signs and the nursing notes.  Pertinent labs & imaging results that were available during my care of the patient were reviewed by me and considered in my medical decision making (see chart for details).    Patient is asking for narcotic pain medication.  Upon reviewing his previous visits and admissions.  Patient has long-standing history of opioid and  IV drug abuse.  The patient will be discharged with NSAIDs and Skelaxin to use ice and heat on the areas that are sore.  Patient does not have any swelling or redness to the language no signs of infection.  The  pain is palpable over the anterior portion of the thigh.  There is no signs of altered appear to be blood clot in the leg  Final Clinical Impressions(s) / ED Diagnoses   Final diagnoses:  Pain of left anterior lower extremity    New Prescriptions Discharge Medication List as of 07/28/2017 10:05 PM    START taking these medications   Details  ibuprofen (ADVIL,MOTRIN) 800 MG tablet Take 1 tablet (800 mg total) by mouth every 8 (eight) hours as needed., Starting Tue 07/28/2017, Print    metaxalone (SKELAXIN) 800 MG tablet Take 1 tablet (800 mg total) by mouth 3 (three) times daily., Starting Tue 07/28/2017, Print         Tajia Szeliga, [place], PA-C 07/30/17 0145    Quintella Reichert, MD 07/30/17 [phone]

## 2017-08-15 ENCOUNTER — Inpatient Hospital Stay (HOSPITAL_BASED_OUTPATIENT_CLINIC_OR_DEPARTMENT_OTHER)
Admission: EM | Admit: 2017-08-15 | Discharge: 2017-08-22 | DRG: 539 | Disposition: A | Discharge status: Skilled Nursing Facility | Payer: Self-pay | Attending: Family Medicine | Admitting: Family Medicine

## 2017-08-15 ENCOUNTER — Encounter (HOSPITAL_BASED_OUTPATIENT_CLINIC_OR_DEPARTMENT_OTHER): Payer: Self-pay | Admitting: Emergency Medicine

## 2017-08-15 ENCOUNTER — Emergency Department (HOSPITAL_BASED_OUTPATIENT_CLINIC_OR_DEPARTMENT_OTHER): Payer: Self-pay

## 2017-08-15 DIAGNOSIS — Z8249 Family history of ischemic heart disease and other diseases of the circulatory system: Secondary | ICD-10-CM

## 2017-08-15 DIAGNOSIS — M545 Low back pain, unspecified: Secondary | ICD-10-CM

## 2017-08-15 DIAGNOSIS — B9561 Methicillin susceptible Staphylococcus aureus infection as the cause of diseases classified elsewhere: Secondary | ICD-10-CM | POA: Diagnosis present

## 2017-08-15 DIAGNOSIS — D72829 Elevated white blood cell count, unspecified: Secondary | ICD-10-CM | POA: Diagnosis present

## 2017-08-15 DIAGNOSIS — Z8673 Personal history of transient ischemic attack (TIA), and cerebral infarction without residual deficits: Secondary | ICD-10-CM

## 2017-08-15 DIAGNOSIS — Z720 Tobacco use: Secondary | ICD-10-CM | POA: Diagnosis present

## 2017-08-15 DIAGNOSIS — R739 Hyperglycemia, unspecified: Secondary | ICD-10-CM | POA: Diagnosis present

## 2017-08-15 DIAGNOSIS — M4646 Discitis, unspecified, lumbar region: Secondary | ICD-10-CM | POA: Diagnosis present

## 2017-08-15 DIAGNOSIS — M869 Osteomyelitis, unspecified: Secondary | ICD-10-CM

## 2017-08-15 DIAGNOSIS — Z79899 Other long term (current) drug therapy: Secondary | ICD-10-CM

## 2017-08-15 DIAGNOSIS — R1084 Generalized abdominal pain: Secondary | ICD-10-CM

## 2017-08-15 DIAGNOSIS — Z5900 Homelessness unspecified: Secondary | ICD-10-CM

## 2017-08-15 DIAGNOSIS — K592 Neurogenic bowel, not elsewhere classified: Secondary | ICD-10-CM | POA: Diagnosis present

## 2017-08-15 DIAGNOSIS — E871 Hypo-osmolality and hyponatremia: Secondary | ICD-10-CM | POA: Diagnosis present

## 2017-08-15 DIAGNOSIS — I10 Essential (primary) hypertension: Secondary | ICD-10-CM | POA: Diagnosis present

## 2017-08-15 DIAGNOSIS — R768 Other specified abnormal immunological findings in serum: Secondary | ICD-10-CM | POA: Diagnosis present

## 2017-08-15 DIAGNOSIS — I700 Atherosclerosis of aorta: Secondary | ICD-10-CM | POA: Diagnosis present

## 2017-08-15 DIAGNOSIS — F1110 Opioid abuse, uncomplicated: Secondary | ICD-10-CM | POA: Diagnosis present

## 2017-08-15 DIAGNOSIS — K5900 Constipation, unspecified: Secondary | ICD-10-CM | POA: Diagnosis present

## 2017-08-15 DIAGNOSIS — B1920 Unspecified viral hepatitis C without hepatic coma: Secondary | ICD-10-CM | POA: Diagnosis present

## 2017-08-15 DIAGNOSIS — M4640 Discitis, unspecified, site unspecified: Secondary | ICD-10-CM | POA: Diagnosis present

## 2017-08-15 DIAGNOSIS — F1910 Other psychoactive substance abuse, uncomplicated: Secondary | ICD-10-CM | POA: Diagnosis present

## 2017-08-15 DIAGNOSIS — M4626 Osteomyelitis of vertebra, lumbar region: Principal | ICD-10-CM | POA: Diagnosis present

## 2017-08-15 DIAGNOSIS — R945 Abnormal results of liver function studies: Secondary | ICD-10-CM | POA: Diagnosis present

## 2017-08-15 DIAGNOSIS — G061 Intraspinal abscess and granuloma: Secondary | ICD-10-CM | POA: Diagnosis present

## 2017-08-15 DIAGNOSIS — R7989 Other specified abnormal findings of blood chemistry: Secondary | ICD-10-CM | POA: Diagnosis present

## 2017-08-15 DIAGNOSIS — Z7982 Long term (current) use of aspirin: Secondary | ICD-10-CM

## 2017-08-15 HISTORY — DX: Cerebral infarction, unspecified: I63.9

## 2017-08-15 HISTORY — DX: Endocarditis, valve unspecified: I38

## 2017-08-15 HISTORY — DX: Cutaneous abscess, unspecified: L02.91

## 2017-08-15 HISTORY — DX: Depression, unspecified: F32.A

## 2017-08-15 HISTORY — DX: Anxiety disorder, unspecified: F41.9

## 2017-08-15 HISTORY — DX: Major depressive disorder, single episode, unspecified: F32.9

## 2017-08-15 LAB — CBC WITH DIFFERENTIAL/PLATELET
Basophils Absolute: 0 10*3/uL (ref 0.0–0.1)
Basophils Relative: 0 %
EOS ABS: 0.1 10*3/uL (ref 0.0–0.7)
Eosinophils Relative: 1 %
HEMATOCRIT: 39.4 % (ref 39.0–52.0)
Hemoglobin: 13 g/dL (ref 13.0–17.0)
Lymphocytes Relative: 12 %
Lymphs Abs: 1.6 10*3/uL (ref 0.7–4.0)
MCH: 27.7 pg (ref 26.0–34.0)
MCHC: 33 g/dL (ref 30.0–36.0)
MCV: 84 fL (ref 78.0–100.0)
MONOS PCT: 11 %
Monocytes Absolute: 1.5 10*3/uL — ABNORMAL HIGH (ref 0.1–1.0)
NEUTROS PCT: 76 %
Neutro Abs: 10.1 10*3/uL — ABNORMAL HIGH (ref 1.7–7.7)
Platelets: 488 10*3/uL — ABNORMAL HIGH (ref 150–400)
RBC: 4.69 MIL/uL (ref 4.22–5.81)
RDW: 13.9 % (ref 11.5–15.5)
WBC: 13.2 10*3/uL — AB (ref 4.0–10.5)

## 2017-08-15 LAB — COMPREHENSIVE METABOLIC PANEL
ALK PHOS: 246 U/L — AB (ref 38–126)
ALT: 106 U/L — ABNORMAL HIGH (ref 17–63)
ANION GAP: 11 (ref 5–15)
AST: 82 U/L — ABNORMAL HIGH (ref 15–41)
Albumin: 3.7 g/dL (ref 3.5–5.0)
BILIRUBIN TOTAL: 0.7 mg/dL (ref 0.3–1.2)
BUN: 26 mg/dL — ABNORMAL HIGH (ref 6–20)
CALCIUM: 9.5 mg/dL (ref 8.9–10.3)
CO2: 27 mmol/L (ref 22–32)
Chloride: 95 mmol/L — ABNORMAL LOW (ref 101–111)
Creatinine, Ser: 0.79 mg/dL (ref 0.61–1.24)
GFR calc non Af Amer: >60 mL/min (ref >60)
Glucose, Bld: 120 mg/dL — ABNORMAL HIGH (ref 65–99)
Potassium: 5.1 mmol/L (ref 3.5–5.1)
Sodium: 133 mmol/L — ABNORMAL LOW (ref 135–145)
TOTAL PROTEIN: 8.8 g/dL — AB (ref 6.5–8.1)

## 2017-08-15 LAB — LIPASE, BLOOD: LIPASE: 19 U/L (ref 11–51)

## 2017-08-15 LAB — I-STAT CG4 LACTIC ACID, ED: LACTIC ACID, VENOUS: 1.46 mmol/L (ref 0.5–1.9)

## 2017-08-15 MED ORDER — IBUPROFEN 800 MG PO TABS
800.0000 mg | ORAL_TABLET | Freq: Once | ORAL | Status: AC
  Administered 2017-08-16: 800 mg via ORAL
  Filled 2017-08-15: qty 1

## 2017-08-15 MED ORDER — IOPAMIDOL (ISOVUE-300) INJECTION 61%
100.0000 mL | Freq: Once | INTRAVENOUS | Status: AC | PRN
  Administered 2017-08-16: 100 mL via INTRAVENOUS

## 2017-08-15 NOTE — ED Triage Notes (Signed)
Patient reports that he has not had a BM in 2 weeks. THe patient reports that he has tried multiple methods to loosen his Bowels, now is having lower back pain and feels like he is impacted

## 2017-08-15 NOTE — ED Provider Notes (Signed)
[facility] DEPT [facility] Provider Note   CSN: [id number] Arrival date & time: 08/15/17  1845     History   Chief Complaint Chief Complaint  Patient presents with  . Constipation    HPI Jesus Watkins is a 46 y.o. male.  The history is provided by the patient and medical records.  Abdominal Pain   The pain is located in the suprapubic region, RLQ and LLQ. The quality of the pain is aching, cramping and pressure-like. The pain is at a severity of 8/10. The pain is severe. Associated symptoms include nausea and constipation. Pertinent negatives include fever, diarrhea, melena, vomiting, dysuria, frequency and headaches. Flatus: normal flatus. Nothing aggravates the symptoms. Nothing relieves the symptoms.  Constipation   This is a new problem. The current episode started more than 1 week ago (2 weeks no BM). Associated symptoms include abdominal pain. Pertinent negatives include no dysuria. Flatus: normal flatus. There has been adequate water intake. Treatments tried: self enema at home. The treatment provided no relief. Risk factors include a change in medication usage/dosage.    Past Medical History:  Diagnosis Date  . Acid reflux   . Essential hypertension   . Hyperglycemia 10/2016    Patient Active Problem List   Diagnosis Date Noted  . Heroin withdrawal ([place])   . Heroin abuse   . Sepsis due to pneumonia ([place])   . HCAP (healthcare-associated pneumonia)   . Acute renal failure ([place])   . 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 Severe sepsis ([place]) 12/14/2016  . Septic shock ([place]) 12/13/2016  . Abscess of antecubital fossa 11/05/2016  . Hyperglycemia 11/05/2016  . IVDA (intravenous drug abuse) complicating pregnancy 44/81/8563  . Leukocytosis 11/05/2016  . Essential hypertension 11/05/2016  . Homeless 11/05/2016  . Cellulitis     Past Surgical History:  Procedure Laterality Date  . bilateral arm abscesses    . DENTAL SURGERY    . I&D EXTREMITY Bilateral 11/05/2016    Procedure: IRRIGATION AND DEBRIDEMENT EXTREMITY;  Surgeon: Roseanne Kaufman, MD;  Location: [place];  Service: Orthopedics;  Laterality: Bilateral;  . I&D EXTREMITY Bilateral 11/06/2016   Procedure: IRRIGATION AND DEBRIDEMENT AND REPAIR OF ASSOCIATED STRUCTURES RIGHT AND LEFT ARMS;  Surgeon: Roseanne Kaufman, MD;  Location: [place];  Service: Orthopedics;  Laterality: Bilateral;  . TEE WITHOUT CARDIOVERSION N/A 12/19/2016   Procedure: TRANSESOPHAGEAL ECHOCARDIOGRAM (TEE);  Surgeon: Sanda Klein, MD;  Location: [facility] ENDOSCOPY;  Service: Cardiovascular;  Laterality: N/A;       Home Medications    Prior to Admission medications   Medication Sig Start Date End Date Taking? Authorizing Provider  aspirin EC 81 MG EC tablet Take 1 tablet (81 mg total) by mouth daily. 12/23/16   Ghimire, Henreitta Leber, MD  folic acid (FOLVITE) 1 MG tablet Take 1 tablet (1 mg total) by mouth daily. 11/12/16   Mariel Aloe, MD  hydrOXYzine (ATARAX/VISTARIL) 25 MG tablet Take 1 tablet (25 mg total) by mouth every 6 (six) hours as needed for anxiety. 12/23/16   Ghimire, Henreitta Leber, MD  ibuprofen (ADVIL,MOTRIN) 800 MG tablet Take 1 tablet (800 mg total) by mouth every 8 (eight) hours as needed. 07/28/17   Lawyer, Harrell Gave, PA-C  LORazepam (ATIVAN) 1 MG tablet Take 1 tablet (1 mg total) by mouth every 12 (twelve) hours as needed for anxiety. 12/23/16   Ghimire, Henreitta Leber, MD  metaxalone (SKELAXIN) 800 MG tablet Take 1 tablet (800 mg total) by mouth 3 (three) times daily. 07/28/17  Lawyer, Harrell Gave, PA-C  methocarbamol (ROBAXIN) 500 MG tablet Take 1 tablet (500 mg total) by mouth every 8 (eight) hours as needed for muscle spasms. 12/23/16   Ghimire, Henreitta Leber, MD  Multiple Vitamin (MULTIVITAMIN WITH MINERALS) TABS tablet Take 1 tablet by mouth daily. 11/12/16   Mariel Aloe, MD  naproxen (NAPROSYN) 500 MG tablet Take 1 tablet (500 mg total) by mouth 2 (two) times daily as needed (aching, pain, or discomfort). 12/23/16   Ghimire, Henreitta Leber, MD  thiamine 100 MG tablet Take 1 tablet (100 mg total) by mouth daily. 11/12/16   Mariel Aloe, MD  vancomycin IVPB Inject 1,000 mg into the vein every 12 (twelve) hours. Indication:  Pneumonia and presumed endocarditis Last Day of Therapy:  01/24/17 Labs - Sunday/Monday:  CBC/D, BMP, and vancomycin trough. Labs - Thursday:  BMP and vancomycin trough Labs - Every other week:  ESR and CRP 12/23/16   Ghimire, Henreitta Leber, MD    Family History Family History  Problem Relation Age of Onset  . Family history unknown: Yes    Social History Social History  Substance Use Topics  . Smoking status: Current Every Day Smoker    Packs/day: 0.50    Types: Cigarettes  . Smokeless tobacco: Never Used  . Alcohol use No     Allergies   Patient has no known allergies.   Review of Systems Review of Systems  Constitutional: Negative for chills, diaphoresis, fatigue and fever.  HENT: Negative for congestion and rhinorrhea.   Eyes: Negative for visual disturbance.  Respiratory: Negative for cough, chest tightness, shortness of breath, wheezing and stridor.   Cardiovascular: Negative for chest pain and palpitations.  Gastrointestinal: Positive for abdominal distention, abdominal pain, constipation and nausea. Negative for blood in stool, diarrhea, melena and vomiting. Flatus: normal flatus.  Genitourinary: Negative for dysuria, flank pain and frequency.  Musculoskeletal: Positive for back pain. Negative for neck pain and neck stiffness.  Skin: Negative for rash and wound.  Neurological: Negative for weakness, light-headedness, numbness and headaches.  Psychiatric/Behavioral: Negative for agitation.  All other systems reviewed and are negative.    Physical Exam Updated Vital Signs BP (!) 142/98 (BP Location: Right Arm)   Pulse 97   Temp 98.2 F (36.8 C) (Oral)   Resp 18   Ht 5' 7"  (1.702 m)   Wt 81.6 kg (180 lb)   SpO2 97%   BMI 28.19 kg/m   Physical Exam  Constitutional: He is  oriented to person, place, and time. He appears well-developed and well-nourished.  HENT:  Head: Normocephalic and atraumatic.  Mouth/Throat: Oropharynx is clear and moist. No oropharyngeal exudate.  Eyes: Pupils are equal, round, and reactive to light. Conjunctivae and EOM are normal.  Neck: Normal range of motion. Neck supple.  Cardiovascular: Normal rate and intact distal pulses.   No murmur heard. Pulmonary/Chest: Effort normal and breath sounds normal. No stridor. No respiratory distress. He has no wheezes. He exhibits no tenderness.  Abdominal: Soft. Normal appearance and bowel sounds are normal. There is tenderness in the right lower quadrant, suprapubic area and left lower quadrant. There is no rigidity and no CVA tenderness.    Musculoskeletal: He exhibits tenderness. He exhibits no edema.       Lumbar back: He exhibits tenderness and pain.       Back:  Neurological: He is alert and oriented to person, place, and time. No sensory deficit. He exhibits normal muscle tone.  Skin: Skin is warm and dry.  Capillary refill takes less than 2 seconds. No erythema. No pallor.  Psychiatric: He has a normal mood and affect.  Nursing note and vitals reviewed.    ED Treatments / Results  Labs (all labs ordered are listed, but only abnormal results are displayed) Labs Reviewed  CBC WITH DIFFERENTIAL/PLATELET - Abnormal; Notable for the following:       Result Value   WBC 13.2 (*)    Platelets 488 (*)    Neutro Abs 10.1 (*)    Monocytes Absolute 1.5 (*)    All other components within normal limits  COMPREHENSIVE METABOLIC PANEL - Abnormal; Notable for the following:    Sodium 133 (*)    Chloride 95 (*)    Glucose, Bld 120 (*)    BUN 26 (*)    Total Protein 8.8 (*)    AST 82 (*)    ALT 106 (*)    Alkaline Phosphatase 246 (*)    All other components within normal limits  I-STAT CG4 LACTIC ACID, ED - Abnormal; Notable for the following:    Lactic Acid, Venous 2.85 (*)    All other  components within normal limits  CULTURE, BLOOD (ROUTINE X 2)  CULTURE, BLOOD (ROUTINE X 2)  LIPASE, BLOOD  HEPATITIS PANEL, ACUTE  I-STAT CG4 LACTIC ACID, ED    EKG  EKG Interpretation None       Radiology Ct Abdomen Pelvis W Contrast  Result Date: 08/16/2017 CLINICAL DATA:  Abdominal and severe low back pain. Abdominal distention. No bowel movement for 2 weeks. Attempted cells enema today by a lotion bottle. EXAM: CT ABDOMEN AND PELVIS WITH CONTRAST TECHNIQUE: Multidetector CT imaging of the abdomen and pelvis was performed using the standard protocol following bolus administration of intravenous contrast. CONTRAST:  150m ISOVUE-300 IOPAMIDOL (ISOVUE-300) INJECTION 61% COMPARISON:  None. FINDINGS: Lower chest: The lung bases are clear. Hepatobiliary: No focal liver abnormality is seen. No gallstones, gallbladder wall thickening, or biliary dilatation. Pancreas: No ductal dilatation or inflammation. Spleen: Normal in size without focal abnormality. Adrenals/Urinary Tract: Normal adrenal glands. Mild motion artifact through the kidneys. Homogeneous enhancement with symmetric excretion on delayed phase imaging. Stomach/Bowel: Stomach is nondistended. No small bowel inflammation or obstruction. Fecalization of distal small bowel contents. Moderate large volume of stool throughout the entire colon, the distal most sigmoid colon is decompressed. No colonic wall thickening. No obstructing mass. Normal appendix. Vascular/Lymphatic: Aortic atherosclerosis without aneurysm. There are small retroperitoneal lymph nodes, for example aortocaval node measures 9 mm image 35 series 2. Additional small retroperitoneal nodes. Reproductive: Prostate is unremarkable. Other: Retroperitoneal edema and soft tissue stranding, most prominent in the region of L3-L4. No free air, free fluid, or intra-abdominal fluid collection. Musculoskeletal: Abnormal soft tissue density in the region of L3-L4 disc with adjacent  retroperitoneal edema. Semi density extends into bilateral psoas muscles without dominant fluid collection. Question of abnormal epidural density L3-L4 level. There are early endplate changes involving superior L4 and inferior L3 suspicious for osteomyelitis. IMPRESSION: 1. Findings suspicious for L3-L4 diskitis osteomyelitis with possible epidural component. Recommend lumbar spine MRI, with contrast if no contraindications. 2. Large colonic stool burden and fecalization of distal small bowel contents consistent with constipation and slow transit. 3.  Aortic Atherosclerosis (ICD10-I70.0). These results were called by telephone at the time of interpretation on 08/16/2017 at 12:38 am to Dr. CMarda Stalker, who verbally acknowledged these results. Electronically Signed   By: MJeb LeveringM.D.   On: 08/16/2017 00:39    Procedures Procedures (  including critical care time)  Medications Ordered in ED Medications  ibuprofen (ADVIL,MOTRIN) tablet 800 mg (800 mg Oral Given 08/16/17 0054)  iopamidol (ISOVUE-300) 61 % injection 100 mL (100 mLs Intravenous Contrast Given 08/16/17 0000)  piperacillin-tazobactam (ZOSYN) IVPB 3.375 g (3.375 g Intravenous New Bag/Given 08/16/17 0145)  vancomycin (VANCOCIN) IVPB 1000 mg/200 mL premix (1,000 mg Intravenous New Bag/Given 08/16/17 0147)  sodium chloride 0.9 % bolus 1,000 mL (0 mLs Intravenous Stopped 08/16/17 0158)    And  sodium chloride 0.9 % bolus 1,000 mL (0 mLs Intravenous Stopped 08/16/17 0056)    And  sodium chloride 0.9 % bolus 500 mL (500 mLs Intravenous New Bag/Given 08/16/17 0159)     Initial Impression / Assessment and Plan / ED Course  I have reviewed the triage vital signs and the nursing notes.  Pertinent labs & imaging results that were available during my care of the patient were reviewed by me and considered in my medical decision making (see chart for details).     Jesus Watkins is a 46 y.o. male With a past medical history significant for  IV drug abuse and prior endocarditis who presents with constipation, abdominal pain, and back pain. Patient reports that his last heroin use was three weeks ago and was through IV injection. Patient says that he has not had a bowel movement in two weeks and has developed back pain in that same timeframe. He reports no difficulty with urination. He says he is not done heroin since his constipation started. He does report using Suboxone. He describes his abdominal pain as tightness, cramping, and aching. He describes it as severe. He also reports severe back pain all across his low back. He denies numbness or tingling of his legs. He denies any traumatic injuries. He denies fevers, chills, chest pain, shortness of breath, or cough. He denies any neurologic deficits currently.  Of note, patient reports that he tried to use a self-made enema to assist his bowel movement today. He reports using a lotion bottle that he fill with warm water to help with an enema. He reports minimal success and relief. He denies any rectal bleeding after this attempt. He reports his back pain is 10 out of 10 severity.  On exam, patient has tenderness across his low back. Abdomen is also tender and slightly distended. Bowel sounds are present. No weakness or numbness in the lower extremities. Normal pulses in feet. Lungs clear. Heart sounds difficult to auscultated but no significant murmur was appreciated. No rashes seen. Patient has scars on his arms from previous abscesses.  Given patient's two weeks of constipation with no BM and significant abdominal pain, patient will have laboratory testing and imaging to look for obstruction or other cause of abdominal pain.  Laboratory testing revealed mild leukocytosis that is slightly worsen from prior. Lactic acid initially within normal range. Liver function elevated, will check hepatitis panel given IV drug use. Kidney function normal. Patient will have CT scan of the abdomen and  pelvis.  CT scan showed no evidence of impaction but did show large still burning and likely diskitis/osteomyelitis of the lumbar spine.   Suspect the spine infection is affecting his nerves controlling bowel movement leading to the constipation.  Cultures obtained and patient started on broad-spectrum antibiotics for further management. MRI of the lumbar spine to further evalutate infection. Patient admitted to hospitalist service for further management.    Final Clinical Impressions(s) / ED Diagnoses   Final diagnoses:  Discitis of lumbar region  Osteomyelitis, unspecified site, unspecified type (HCC)  Constipation, unspecified constipation type  Acute low back pain without sciatica, unspecified back pain laterality  Generalized abdominal pain    New Prescriptions New Prescriptions   No medications on file    Clinical Impression: 1. Discitis of lumbar region   2. Osteomyelitis, unspecified site, unspecified type ([place])   3. Constipation, unspecified constipation type   4. Acute low back pain without sciatica, unspecified back pain laterality   5. Generalized abdominal pain     Disposition: Admit to Hospitalist service    Tegeler, Gwenyth Allegra, MD 08/16/17 0300

## 2017-08-15 NOTE — ED Notes (Signed)
Alert, NAD, calm, interactive, resps e/u, speaking in clear complete sentences, no dyspnea noted, skin W&D, VSS, c/o chronic back pain and constipation, also intermittent nausea, (denies: sob, nausea, dizziness or visual changes). Here from [facility].

## 2017-08-16 ENCOUNTER — Inpatient Hospital Stay (HOSPITAL_COMMUNITY): Payer: Self-pay

## 2017-08-16 ENCOUNTER — Encounter (HOSPITAL_COMMUNITY): Payer: Self-pay | Admitting: Internal Medicine

## 2017-08-16 DIAGNOSIS — K5900 Constipation, unspecified: Secondary | ICD-10-CM | POA: Diagnosis present

## 2017-08-16 DIAGNOSIS — I700 Atherosclerosis of aorta: Secondary | ICD-10-CM | POA: Diagnosis present

## 2017-08-16 DIAGNOSIS — E871 Hypo-osmolality and hyponatremia: Secondary | ICD-10-CM | POA: Diagnosis present

## 2017-08-16 DIAGNOSIS — I38 Endocarditis, valve unspecified: Secondary | ICD-10-CM | POA: Insufficient documentation

## 2017-08-16 DIAGNOSIS — R945 Abnormal results of liver function studies: Secondary | ICD-10-CM | POA: Diagnosis present

## 2017-08-16 DIAGNOSIS — M4646 Discitis, unspecified, lumbar region: Secondary | ICD-10-CM

## 2017-08-16 DIAGNOSIS — R7989 Other specified abnormal findings of blood chemistry: Secondary | ICD-10-CM | POA: Diagnosis present

## 2017-08-16 DIAGNOSIS — Z720 Tobacco use: Secondary | ICD-10-CM | POA: Diagnosis present

## 2017-08-16 DIAGNOSIS — M4640 Discitis, unspecified, site unspecified: Secondary | ICD-10-CM | POA: Diagnosis present

## 2017-08-16 LAB — CBC
HCT: 37.1 % — ABNORMAL LOW (ref 39.0–52.0)
HEMOGLOBIN: 11.8 g/dL — AB (ref 13.0–17.0)
MCH: 27.1 pg (ref 26.0–34.0)
MCHC: 31.8 g/dL (ref 30.0–36.0)
MCV: 85.3 fL (ref 78.0–100.0)
PLATELETS: 445 10*3/uL — AB (ref 150–400)
RBC: 4.35 MIL/uL (ref 4.22–5.81)
RDW: 14.1 % (ref 11.5–15.5)
WBC: 8.6 10*3/uL (ref 4.0–10.5)

## 2017-08-16 LAB — I-STAT CG4 LACTIC ACID, ED
LACTIC ACID, VENOUS: 2.85 mmol/L — AB (ref 0.5–1.9)
Lactic Acid, Venous: 0.75 mmol/L (ref 0.5–1.9)

## 2017-08-16 LAB — CREATININE, SERUM
CREATININE: 0.84 mg/dL (ref 0.61–1.24)
GFR calc Af Amer: >60 mL/min (ref >60)

## 2017-08-16 LAB — HEMOGLOBIN A1C
HEMOGLOBIN A1C: 6.4 % — AB (ref 4.8–5.6)
MEAN PLASMA GLUCOSE: 136.98 mg/dL

## 2017-08-16 LAB — GLUCOSE, CAPILLARY
GLUCOSE-CAPILLARY: 164 mg/dL — AB (ref 65–99)
Glucose-Capillary: 84 mg/dL (ref 65–99)

## 2017-08-16 LAB — MRSA PCR SCREENING: MRSA BY PCR: NEGATIVE

## 2017-08-16 MED ORDER — FOLIC ACID 1 MG PO TABS: 1.0000 mg | ORAL_TABLET | Freq: Every day | ORAL | Status: DC

## 2017-08-16 MED ORDER — SODIUM CHLORIDE 0.9 % IV BOLUS (SEPSIS)
1000.0000 mL | Freq: Once | INTRAVENOUS | Status: AC
  Administered 2017-08-16: 1000 mL via INTRAVENOUS

## 2017-08-16 MED ORDER — POTASSIUM CHLORIDE IN NACL 20-0.9 MEQ/L-% IV SOLN
INTRAVENOUS | Status: AC
  Administered 2017-08-16: 12:00:00 via INTRAVENOUS
  Filled 2017-08-16: qty 1000

## 2017-08-16 MED ORDER — SODIUM CHLORIDE 0.9 % IV BOLUS (SEPSIS)
500.0000 mL | Freq: Once | INTRAVENOUS | Status: AC
  Administered 2017-08-16: 500 mL via INTRAVENOUS

## 2017-08-16 MED ORDER — VANCOMYCIN HCL IN DEXTROSE 1-5 GM/200ML-% IV SOLN
1000.0000 mg | Freq: Two times a day (BID) | INTRAVENOUS | Status: DC
  Administered 2017-08-16: 1000 mg via INTRAVENOUS

## 2017-08-16 MED ORDER — HYDROXYZINE HCL 25 MG PO TABS: 25.0000 mg | ORAL_TABLET | Freq: Four times a day (QID) | ORAL | Status: DC | PRN

## 2017-08-16 MED ORDER — INSULIN ASPART 100 UNIT/ML ~~LOC~~ SOLN
0.0000 [IU] | Freq: Three times a day (TID) | SUBCUTANEOUS | Status: DC
  Administered 2017-08-16 – 2017-08-19 (×2): 2 [IU] via SUBCUTANEOUS
  Administered 2017-08-19: 1 [IU] via SUBCUTANEOUS

## 2017-08-16 MED ORDER — METOCLOPRAMIDE HCL 5 MG/ML IJ SOLN
10.0000 mg | Freq: Four times a day (QID) | INTRAMUSCULAR | Status: DC
  Administered 2017-08-16 – 2017-08-19 (×9): 10 mg via INTRAVENOUS
  Filled 2017-08-16 (×9): qty 2

## 2017-08-16 MED ORDER — LORAZEPAM 1 MG PO TABS: 1.0000 mg | ORAL_TABLET | Freq: Two times a day (BID) | ORAL | Status: DC | PRN

## 2017-08-16 MED ORDER — SORBITOL 70 % SOLN
30.0000 mL | Freq: Every day | Status: DC | PRN
  Administered 2017-08-16 – 2017-08-20 (×2): 30 mL via ORAL
  Filled 2017-08-16 (×2): qty 30

## 2017-08-16 MED ORDER — THIAMINE HCL 100 MG PO TABS: 100.0000 mg | ORAL_TABLET | Freq: Every day | ORAL | Status: DC

## 2017-08-16 MED ORDER — METHOCARBAMOL 500 MG PO TABS: 500.0000 mg | ORAL_TABLET | Freq: Three times a day (TID) | ORAL | Status: DC | PRN

## 2017-08-16 MED ORDER — ONDANSETRON HCL 4 MG PO TABS: 4.0000 mg | ORAL_TABLET | Freq: Four times a day (QID) | ORAL | Status: DC | PRN

## 2017-08-16 MED ORDER — PIPERACILLIN-TAZOBACTAM 3.375 G IVPB 30 MIN
3.3750 g | Freq: Four times a day (QID) | INTRAVENOUS | Status: DC
  Administered 2017-08-16: 3.375 g via INTRAVENOUS

## 2017-08-16 MED ORDER — VANCOMYCIN HCL IN DEXTROSE 1-5 GM/200ML-% IV SOLN
1000.0000 mg | Freq: Three times a day (TID) | INTRAVENOUS | Status: DC
  Administered 2017-08-16 (×2): 1000 mg via INTRAVENOUS
  Filled 2017-08-16 (×4): qty 200

## 2017-08-16 MED ORDER — OXYCODONE HCL 5 MG PO TABS
15.0000 mg | ORAL_TABLET | ORAL | Status: DC | PRN
  Administered 2017-08-17: 15 mg via ORAL
  Filled 2017-08-16: qty 3

## 2017-08-16 MED ORDER — GADOBENATE DIMEGLUMINE 529 MG/ML IV SOLN
20.0000 mL | Freq: Once | INTRAVENOUS | Status: AC
  Administered 2017-08-16: 17 mL via INTRAVENOUS

## 2017-08-16 MED ORDER — VANCOMYCIN HCL IN DEXTROSE 1-5 GM/200ML-% IV SOLN
1000.0000 mg | Freq: Once | INTRAVENOUS | Status: AC
  Administered 2017-08-16: 1000 mg via INTRAVENOUS

## 2017-08-16 MED ORDER — IBUPROFEN 200 MG PO TABS
800.0000 mg | ORAL_TABLET | Freq: Three times a day (TID) | ORAL | Status: DC | PRN
  Administered 2017-08-16 (×2): 800 mg via ORAL
  Filled 2017-08-16 (×2): qty 4

## 2017-08-16 MED ORDER — ONDANSETRON HCL 4 MG/2ML IJ SOLN: 4.0000 mg | Freq: Four times a day (QID) | INTRAMUSCULAR | Status: DC | PRN

## 2017-08-16 MED ORDER — ASPIRIN 81 MG PO TBEC: 81.0000 mg | DELAYED_RELEASE_TABLET | Freq: Every day | ORAL | Status: DC

## 2017-08-16 MED ORDER — ADULT MULTIVITAMIN W/MINERALS CH: 1.0000 | ORAL_TABLET | Freq: Every day | ORAL | Status: DC

## 2017-08-16 MED ORDER — PIPERACILLIN-TAZOBACTAM 3.375 G IVPB
3.3750 g | Freq: Three times a day (TID) | INTRAVENOUS | Status: DC
  Administered 2017-08-16 (×2): 3.375 g via INTRAVENOUS
  Filled 2017-08-16 (×4): qty 50

## 2017-08-16 MED ORDER — OXYCODONE HCL 5 MG PO TABS: 30.0000 mg | ORAL_TABLET | ORAL | Status: DC | PRN

## 2017-08-16 MED ORDER — METAXALONE 800 MG PO TABS: 800.0000 mg | ORAL_TABLET | Freq: Three times a day (TID) | ORAL | Status: DC

## 2017-08-16 MED ORDER — MAGNESIUM CITRATE PO SOLN
1.0000 | Freq: Once | ORAL | Status: AC | PRN
  Administered 2017-08-16: 1 via ORAL
  Filled 2017-08-16: qty 296

## 2017-08-16 MED ORDER — PIPERACILLIN-TAZOBACTAM 3.375 G IVPB 30 MIN
3.3750 g | Freq: Once | INTRAVENOUS | Status: AC
  Administered 2017-08-16: 3.375 g via INTRAVENOUS
  Filled 2017-08-16: qty 50

## 2017-08-16 MED ORDER — ENOXAPARIN SODIUM 40 MG/0.4ML ~~LOC~~ SOLN
40.0000 mg | SUBCUTANEOUS | Status: DC
  Administered 2017-08-16: 40 mg via SUBCUTANEOUS
  Filled 2017-08-16: qty 0.4

## 2017-08-16 MED ORDER — OXYCODONE HCL ER 15 MG PO T12A
15.0000 mg | EXTENDED_RELEASE_TABLET | Freq: Two times a day (BID) | ORAL | Status: DC
  Administered 2017-08-16 – 2017-08-17 (×2): 15 mg via ORAL
  Filled 2017-08-16 (×2): qty 1

## 2017-08-16 NOTE — ED Notes (Signed)
No changes, remains alert, NAD, calm, pleasant, lying supine, HOB 15 degrees for comfort.

## 2017-08-16 NOTE — Progress Notes (Signed)
Patient arrived via [facility] from [facility]. Hospitalists notified. Nursing will continue to monitor.

## 2017-08-16 NOTE — Progress Notes (Signed)
SS enema was given as ordered with small to moderate results obtained. Prior to enema, sorbitol was given, then enema given. Pt ambulatory to bathroom and states he is feeling some relief from BM.  Also 120 mls of warm prune juice given.  Advised patient to let nursing visualize any stools that he has.

## 2017-08-16 NOTE — ED Notes (Signed)
Back from CT, no changes, alert, NAD, calm, interactive, polite, pleasant.

## 2017-08-16 NOTE — ED Notes (Signed)
Resting supine HOB 15 degrees, NAD, calm, no changes, interactive, skin W&D, bolus complete, repeat lactic drawn, pending [facility] arrival.

## 2017-08-16 NOTE — ED Notes (Signed)
[facility] called and stated that the wait for transport would be at least 2 hours.  Spoke with Dr. Corlis Leak and she stated that it was ok for the patient to wait.

## 2017-08-16 NOTE — ED Notes (Signed)
[facility] here, no changes, pain remains 8/10. Alert, NAD, calm.

## 2017-08-16 NOTE — ED Notes (Signed)
EDP at [facility] discussing results and admission plan.

## 2017-08-16 NOTE — Progress Notes (Signed)
Pharmacy Antibiotic Note  Jesus Watkins is a 46 y.o. male admitted on 08/15/2017 with Discitis.  Pharmacy has been consulted for Vancomycin and Zosyn dosing.  Pt with know hx IVDU and prior endocarditis.  CT (+)discitis, osteomyelitis of lumbar spine  Pt received Vancomycin 1000mg  IV and Zosyn 3.375g IV in ED ~2AM today.  Plan: Continue Vancomycin 1000mg  IV q8 Zosyn 3.375g IV q8 Watch renal fxn, f/u cultures, steady state Vanc trough  Height: 5\' 7"  (170.2 cm) Weight: 180 lb (81.6 kg) IBW/kg (Calculated) : 66.1  Temp (24hrs), Avg:98.2 F (36.8 C), Min:98.1 F (36.7 C), Max:98.2 F (36.8 C)   Recent Labs Lab 08/15/17 2257 08/15/17 2309 08/16/17 0057 08/16/17 0448  WBC 13.2*  --   --   --   CREATININE 0.79  --   --   --   LATICACIDVEN  --  1.46 2.85* 0.75    Estimated Creatinine Clearance: 119.2 mL/min (by C-G formula based on SCr of 0.79 mg/dL).    No Known Allergies  Antimicrobials this admission: 8/26 Vanc >>  8/26 Zosyn >>   Dose adjustments this admission:   Microbiology results: 8/26 BCx:  8/26 MRSA PCR:   Thank you for allowing pharmacy to be a part of this patient's care.  Alvester Morin., PharmD Clinical Pharmacist [facility]

## 2017-08-16 NOTE — H&P (Signed)
History and Physical    Jesus Watkins MOQ:[id number] DOB: 12-27-1970 DOA: 08/15/2017  PCP: Marliss Coots, NP  [facility]  I have personally extensively reviewed patient's old medical records in [place]  Chief Complaint: Constipation 3 weeks  HPI: Jesus Watkins is a 46 y.o. 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 He was seen in our ED on August 8 complaining of knee and hip pain at that time no blood work was obtained the patient was prescribed nonsteroidals and Skelaxin for his discomfort. Subsequent to that has developed significant constipation no bowel movement in a weeks. He also has complained of some abdominal pain radiating 8 out of 10 associated with nausea and constipation. He has tried a homemade enema with little results or relief. His pain is associated with constipation and abdominal pain he has no dysuria he has had normal flatus. He has had adequate water intake.   His last heroin use was 3 weeks ago through IV injection. He has developed back pain since he developed his constipation during that same timeframe. He has had no difficulty with urination. He is use some Suboxone but no heroin IV since developing the constipation. Abdominal pain as a tightness cramping and aching severe he also complains of severe back pain across his low back. But no numbness or tingling of his legs. He has no traumatic injuries he denies fevers chills chest pain shortness of breath or cough. He has no current neurological deficits.   ED Course: Patient was fully evaluated and found to have some abdominal tenderness and distention laboratory testing revealed a mild leukocytosis, normal lactic acid, elevated liver function tests normal kidney function. CT scan of the abdomen did not show any impaction but showed a  large stool burden and likely discitis/osteomyelitis of the L 3-L4 disc. Patient referred to Hospital medicine for management of discitis and severe constipation possibly related to neurogenic issues from the discitis.  Review of Systems:  Review of Systems  Constitutional: Negative for chills, diaphoresis and fever.  HENT: Negative for ear pain, hearing loss and tinnitus.   Eyes: Negative for blurred vision and double vision.  Respiratory: Negative for cough, hemoptysis and sputum production.   Cardiovascular: Negative for chest pain, palpitations, orthopnea and leg swelling.  Gastrointestinal: Positive for abdominal pain, constipation and nausea. Negative for blood in stool, heartburn, melena and vomiting.  Genitourinary: Negative for dysuria, frequency and urgency.  Musculoskeletal: Positive for back pain, joint pain (Knees and hips) and myalgias.  Skin: Negative for itching and rash.  Neurological: Negative for dizziness, tingling and headaches.  Endo/Heme/Allergies: Negative for environmental allergies. Does not bruise/bleed easily.  Psychiatric/Behavioral: Negative for depression and suicidal ideas.    Past Medical History:  Diagnosis Date  . Acid reflux   . 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 Essential hypertension   . Hyperglycemia 10/2016  . 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 bilateral arm abscesses    . DENTAL SURGERY    . I&D EXTREMITY Bilateral 11/05/2016   Procedure: IRRIGATION AND DEBRIDEMENT EXTREMITY;  Surgeon: Roseanne Kaufman, MD;  Location: [place];  Service: Orthopedics;  Laterality: Bilateral;  . I&D EXTREMITY Bilateral 11/06/2016   Procedure: IRRIGATION AND DEBRIDEMENT AND REPAIR OF ASSOCIATED STRUCTURES RIGHT AND LEFT ARMS;  Surgeon: Roseanne Kaufman, MD;  Location: [place];  Service: Orthopedics;  Laterality: Bilateral;  . TEE WITHOUT CARDIOVERSION N/A 12/19/2016   Procedure: TRANSESOPHAGEAL ECHOCARDIOGRAM (TEE);  Surgeon:  Sanda Klein, MD;  Location[facility] ENDOSCOPY;  Service: Cardiovascular;  Laterality: N/A;     reports that he has been smoking Cigarettes.  He has been smoking about 0.50 packs per day. He has never used smokeless tobacco. He reports that he uses drugs, including IV and Heroin. He reports that he does not drink alcohol.  No Known Allergies  Family History  Problem Relation Age of Onset  . Heart attack Mother   . Alcoholism Father   . Liver disease Father      Prior to Admission medications   Medication Sig Start Date End Date Taking? Authorizing Provider  aspirin EC 81 MG EC tablet Take 1 tablet (81 mg total) by mouth daily. 12/23/16   Ghimire, Henreitta Leber, MD  folic acid (FOLVITE) 1 MG tablet Take 1 tablet (1 mg total) by mouth daily. 11/12/16   Mariel Aloe, MD  hydrOXYzine (ATARAX/VISTARIL) 25 MG tablet Take 1 tablet (25 mg total) by mouth every 6 (six) hours as needed for anxiety. 12/23/16   Ghimire, Henreitta Leber, MD  ibuprofen (ADVIL,MOTRIN) 800 MG tablet Take 1 tablet (800 mg total) by mouth every 8 (eight) hours as needed. 07/28/17   Lawyer, Harrell Gave, PA-C  LORazepam (ATIVAN) 1 MG tablet Take 1 tablet (1 mg total) by mouth every 12 (twelve) hours as needed for anxiety. 12/23/16   Ghimire, Henreitta Leber, MD  metaxalone (SKELAXIN) 800 MG tablet Take 1 tablet (800 mg total) by mouth 3 (three) times daily. 07/28/17   Lawyer, Harrell Gave, PA-C  methocarbamol (ROBAXIN) 500 MG tablet Take 1 tablet (500 mg total) by mouth every 8 (eight) hours as needed for muscle spasms. 12/23/16   Ghimire, Henreitta Leber, MD  Multiple Vitamin (MULTIVITAMIN WITH MINERALS) TABS tablet Take 1 tablet by mouth daily. 11/12/16   Mariel Aloe, MD  naproxen (NAPROSYN) 500 MG tablet Take 1 tablet (500 mg total) by mouth 2 (two) times daily as needed (aching, pain, or discomfort). 12/23/16   Ghimire, Henreitta Leber, MD  thiamine 100 MG tablet Take 1 tablet (100 mg total) by mouth daily. 11/12/16   Mariel Aloe, MD  vancomycin IVPB  Inject 1,000 mg into the vein every 12 (twelve) hours. Indication:  Pneumonia and presumed endocarditis Last Day of Therapy:  01/24/17 Labs - Sunday/Monday:  CBC/D, BMP, and vancomycin trough. Labs - Thursday:  BMP and vancomycin trough Labs - Every other week:  ESR and CRP 12/23/16   Jonetta Osgood, MD    Physical Exam: Vitals:   08/16/17 0430 08/16/17 0437 08/16/17 0500 08/16/17 0530  BP: 118/71  103/69 98/72  Pulse: 64  63 (!) 53  Resp: 12  14 13   Temp:  98.1 F (36.7 C)    TempSrc:  Oral    SpO2: 100%  98% 100%  Weight:      Height:       .TCS Constitutional: NAD, calm, comfortable Vitals:   08/16/17 0430 08/16/17 0437 08/16/17 0500 08/16/17 0530  BP: 118/71  103/69 98/72  Pulse: 64  63 (!) 53  Resp: 12  14 13   Temp:  98.1 F (36.7 C)    TempSrc:  Oral    SpO2: 100%  98% 100%  Weight:      Height:       Eyes: PERRL, lids and conjunctivae normal ENMT: Mucous membranes are moist. Posterior pharynx clear of any  exudate or lesions.Normal dentition.  Neck: normal, supple, no masses, no thyromegaly Respiratory: clear to auscultation bilaterally, no wheezing, no crackles. Normal respiratory effort. No accessory muscle use.  Cardiovascular: Regular rate and rhythm, no murmurs / rubs / gallops. No extremity edema. 2+ pedal pulses. No carotid bruits.  Abdomen:Diffuse tenderness without rebound or guarding, no masses palpated. No hepatosplenomegaly. Bowel sounds positive.  Musculoskeletal: no clubbing / cyanosis. No joint deformity upper and lower extremities. Good ROM, no contractures. Normal muscle tone. Point tenderness at the lower lumbar and sacral areas of the spine on examination of the back. No CVA tenderness. Skin: no rashes, lesions, ulcers. No induration, healed scars on bilateral antecubital fossae, Scarred track marks noted Neurologic: CN 2-12 grossly intact. Sensation intact, DTR normal. Strength 5/5 in all 4.  Psychiatric: Normal judgment and insight. Alert and  oriented x 3. Normal mood.     Labs on Admission: I have personally reviewed following labs and imaging studies  CBC:  Recent Labs Lab 08/15/17 2257  WBC 13.2*  NEUTROABS 10.1*  HGB 13.0  HCT 39.4  MCV 84.0  PLT 948*   Basic Metabolic Panel:  Recent Labs Lab 08/15/17 2257  NA 133*  K 5.1  CL 95*  CO2 27  GLUCOSE 120*  BUN 26*  CREATININE 0.79  CALCIUM 9.5   GFR: Estimated Creatinine Clearance: 119.2 mL/min (by C-G formula based on SCr of 0.79 mg/dL). Liver Function Tests:  Recent Labs Lab 08/15/17 2257  AST 82*  ALT 106*  ALKPHOS 246*  BILITOT 0.7  PROT 8.8*  ALBUMIN 3.7    Recent Labs Lab 08/15/17 2257  LIPASE 19   No results for input(s): AMMONIA in the last 168 hours. Coagulation Profile: No results for input(s): INR, PROTIME in the last 168 hours. Cardiac Enzymes: No results for input(s): CKTOTAL, CKMB, CKMBINDEX, TROPONINI in the last 168 hours. BNP (last 3 results) No results for input(s): PROBNP in the last 8760 hours. HbA1C: No results for input(s): HGBA1C in the last 72 hours. CBG: No results for input(s): GLUCAP in the last 168 hours. Lipid Profile: No results for input(s): CHOL, HDL, LDLCALC, TRIG, CHOLHDL, LDLDIRECT in the last 72 hours. Thyroid Function Tests: No results for input(s): TSH, T4TOTAL, FREET4, T3FREE, THYROIDAB in the last 72 hours. Anemia Panel: No results for input(s): VITAMINB12, FOLATE, FERRITIN, TIBC, IRON, RETICCTPCT in the last 72 hours. Urine analysis:    Component Value Date/Time   COLORURINE YELLOW 12/13/2016 2310   APPEARANCEUR HAZY (A) 12/13/2016 2310   LABSPEC 1.034 (H) 12/13/2016 2310   PHURINE 5.0 12/13/2016 2310   GLUCOSEU NEGATIVE 12/13/2016 2310   HGBUR SMALL (A) 12/13/2016 2310   BILIRUBINUR NEGATIVE 12/13/2016 2310   KETONESUR NEGATIVE 12/13/2016 2310   PROTEINUR 100 (A) 12/13/2016 2310   NITRITE NEGATIVE 12/13/2016 2310   LEUKOCYTESUR NEGATIVE 12/13/2016 2310    Radiological Exams on  Admission: Ct Abdomen Pelvis W Contrast  Result Date: 08/16/2017 CLINICAL DATA:  Abdominal and severe low back pain. Abdominal distention. No bowel movement for 2 weeks. Attempted cells enema today by a lotion bottle. EXAM: CT ABDOMEN AND PELVIS WITH CONTRAST TECHNIQUE: Multidetector CT imaging of the abdomen and pelvis was performed using the standard protocol following bolus administration of intravenous contrast. CONTRAST:  148m ISOVUE-300 IOPAMIDOL (ISOVUE-300) INJECTION 61% COMPARISON:  None. FINDINGS: Lower chest: The lung bases are clear. Hepatobiliary: No focal liver abnormality is seen. No gallstones, gallbladder wall thickening, or biliary dilatation. Pancreas: No ductal dilatation or inflammation. Spleen: Normal in size  without focal abnormality. Adrenals/Urinary Tract: Normal adrenal glands. Mild motion artifact through the kidneys. Homogeneous enhancement with symmetric excretion on delayed phase imaging. Stomach/Bowel: Stomach is nondistended. No small bowel inflammation or obstruction. Fecalization of distal small bowel contents. Moderate large volume of stool throughout the entire colon, the distal most sigmoid colon is decompressed. No colonic wall thickening. No obstructing mass. Normal appendix. Vascular/Lymphatic: Aortic atherosclerosis without aneurysm. There are small retroperitoneal lymph nodes, for example aortocaval node measures 9 mm image 35 series 2. Additional small retroperitoneal nodes. Reproductive: Prostate is unremarkable. Other: Retroperitoneal edema and soft tissue stranding, most prominent in the region of L3-L4. No free air, free fluid, or intra-abdominal fluid collection. Musculoskeletal: Abnormal soft tissue density in the region of L3-L4 disc with adjacent retroperitoneal edema. Semi density extends into bilateral psoas muscles without dominant fluid collection. Question of abnormal epidural density L3-L4 level. There are early endplate changes involving superior L4 and  inferior L3 suspicious for osteomyelitis. IMPRESSION: 1. Findings suspicious for L3-L4 diskitis osteomyelitis with possible epidural component. Recommend lumbar spine MRI, with contrast if no contraindications. 2. Large colonic stool burden and fecalization of distal small bowel contents consistent with constipation and slow transit. 3.  Aortic Atherosclerosis (ICD10-I70.0). These results were called by telephone at the time of interpretation on 08/16/2017 at 12:38 am to Dr. Marda Stalker , who verbally acknowledged these results. Electronically Signed   By: Jeb Levering M.D.   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 Discitis of lumbar region: Patient will be admitted into the hospital we'll obtain an MRI of his lumbar sacral spine to further evaluate this potential area. There is concern for abscess. We'll start Zosyn and vancomycin. Will also check patient's nares for MRSA as he has had no prior history of MRSA. May be possible to discontinue his vancomycin if his MRSA is negative. Patient may require consultation with spine or neurosurgery pending results of MRI.  2. 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 They have been unsuccessful. We will try a high colonic soapsuds enema using high-volume and hot water. I'm also going to start him on Reglan 10 mg IV every 6 hours scheduled I believe that until his discitis improves patient may have some difficulty with normal bowel movements. Additionally when necessary medications are ordered for moderate and severe constipation.  3. Leukocytosis this is likely related to infection of his disks. Will monitor response to  treatment.  4. Heroin abuse: Patient undoubtedly has pain from his discitis. I will start him on scheduled OxyContin 15 mg every 12 hours I also ordered oxycodone 15 mg every 4 hours for moderate pain ibuprofen 800 mg every 8 hours for mild pain and oxycodone 30 mg every 4 hours for severe breakthrough pain. I do not believe that this patient should be treated with IV narcotics as this has been a chronic ongoing problem and should be able to be managed with oral medications.  5. Hyponatremia will limit free water, will hydrate patient and monitor closely.  6. Abnormal liver function tests: Hepatitis studies are being sent. He has had mildly elevated LFTs in the past. In December 2017 his hepatitis  profile was negative. ER physician has already sent a repeat hepatitis profile.  7. Homelessness: We'll continue to support patient and assist with placement at discharge as possible.  8. Aortic atherosclerosis: This was noted on CT scan will monitor.  9. Tobacco abuse: Patient will need smoking cessation counseling prior to discharge. He states he has not smoked in 2 weeks because he has been feeling bad.  10. Hyperglycemia: Patient has had multiple elevated readings of blood glucoses. We'll check a hemoglobin A1c. We'll start sliding scale insulin coverage for elevated blood glucoses. They should does not yet have a diagnosis of diabetes.   DVT prophylaxis: Lovenox Code Status: Full Family Communication: None available Disposition Plan: Patient homeless currently; to be determined Consults called: None yet Admission status: Inpatient   Lady Deutscher MD, FACP [facility] Pager [phone]  If 7PM-7AM, please contact night-coverage [email or web address] Password [facility]  08/16/2017, 8:33 AM

## 2017-08-16 NOTE — Progress Notes (Signed)
Patient states that he no longer want any narcotic medication for pain, only the Ibuprofen

## 2017-08-17 ENCOUNTER — Encounter (HOSPITAL_COMMUNITY): Payer: Self-pay | Admitting: General Surgery

## 2017-08-17 ENCOUNTER — Inpatient Hospital Stay (HOSPITAL_COMMUNITY): Payer: Self-pay

## 2017-08-17 DIAGNOSIS — Z8673 Personal history of transient ischemic attack (TIA), and cerebral infarction without residual deficits: Secondary | ICD-10-CM

## 2017-08-17 DIAGNOSIS — I10 Essential (primary) hypertension: Secondary | ICD-10-CM

## 2017-08-17 DIAGNOSIS — G061 Intraspinal abscess and granuloma: Secondary | ICD-10-CM

## 2017-08-17 DIAGNOSIS — M545 Low back pain: Secondary | ICD-10-CM

## 2017-08-17 DIAGNOSIS — L0291 Cutaneous abscess, unspecified: Secondary | ICD-10-CM

## 2017-08-17 DIAGNOSIS — Z590 Homelessness: Secondary | ICD-10-CM

## 2017-08-17 HISTORY — PX: IR LUMBAR DISC ASPIRATION W/IMG GUIDE: IMG5306

## 2017-08-17 HISTORY — DX: Cutaneous abscess, unspecified: L02.91

## 2017-08-17 LAB — GLUCOSE, CAPILLARY
GLUCOSE-CAPILLARY: 67 mg/dL (ref 65–99)
GLUCOSE-CAPILLARY: 95 mg/dL (ref 65–99)
GLUCOSE-CAPILLARY: 88 mg/dL (ref 65–99)
GLUCOSE-CAPILLARY: 153 mg/dL — AB (ref 65–99)

## 2017-08-17 LAB — TSH: TSH: 3.9 u[IU]/mL (ref 0.350–4.500)

## 2017-08-17 LAB — PROTIME-INR
INR: 1.08
PROTHROMBIN TIME: 14 s (ref 11.4–15.2)

## 2017-08-17 MED ORDER — BUPIVACAINE HCL (PF) 0.5 % IJ SOLN
INTRAMUSCULAR | Status: AC
  Administered 2017-08-17: 20 mL
  Filled 2017-08-17: qty 30

## 2017-08-17 MED ORDER — HYDROMORPHONE HCL 1 MG/ML IJ SOLN
INTRAMUSCULAR | Status: AC
  Filled 2017-08-17: qty 0.5

## 2017-08-17 MED ORDER — KETOROLAC TROMETHAMINE 15 MG/ML IJ SOLN
15.0000 mg | Freq: Four times a day (QID) | INTRAMUSCULAR | Status: DC | PRN
  Administered 2017-08-18 – 2017-08-22 (×15): 15 mg via INTRAVENOUS
  Filled 2017-08-17 (×15): qty 1

## 2017-08-17 MED ORDER — MIDAZOLAM HCL 2 MG/2ML IJ SOLN
INTRAMUSCULAR | Status: AC
  Filled 2017-08-17: qty 2

## 2017-08-17 MED ORDER — HYDROMORPHONE HCL 1 MG/ML IJ SOLN
INTRAMUSCULAR | Status: AC | PRN
  Administered 2017-08-17 (×3): 1 mg via INTRAVENOUS

## 2017-08-17 MED ORDER — MIDAZOLAM HCL 2 MG/2ML IJ SOLN
INTRAMUSCULAR | Status: AC | PRN
  Administered 2017-08-17 (×2): 1 mg via INTRAVENOUS

## 2017-08-17 MED ORDER — FENTANYL CITRATE (PF) 100 MCG/2ML IJ SOLN
INTRAMUSCULAR | Status: AC
  Administered 2017-08-17: 25 ug
  Filled 2017-08-17: qty 2

## 2017-08-17 MED ORDER — FENTANYL CITRATE (PF) 100 MCG/2ML IJ SOLN
25.0000 ug | Freq: Once | INTRAMUSCULAR | Status: AC
Start: 2017-08-17 — End: 2017-08-17
  Administered 2017-08-17: 25 ug via INTRAVENOUS

## 2017-08-17 MED ORDER — OXYCODONE HCL 5 MG PO TABS
10.0000 mg | ORAL_TABLET | ORAL | Status: DC | PRN
  Administered 2017-08-17 – 2017-08-22 (×17): 10 mg via ORAL
  Filled 2017-08-17 (×17): qty 2

## 2017-08-17 MED ORDER — OXYCODONE HCL ER 10 MG PO T12A
10.0000 mg | EXTENDED_RELEASE_TABLET | Freq: Two times a day (BID) | ORAL | Status: DC
  Administered 2017-08-17 – 2017-08-22 (×10): 10 mg via ORAL
  Filled 2017-08-17 (×10): qty 1

## 2017-08-17 MED ORDER — ENOXAPARIN SODIUM 40 MG/0.4ML ~~LOC~~ SOLN
40.0000 mg | SUBCUTANEOUS | Status: DC
  Administered 2017-08-18 – 2017-08-21 (×4): 40 mg via SUBCUTANEOUS
  Filled 2017-08-17 (×4): qty 0.4

## 2017-08-17 MED ORDER — FENTANYL CITRATE (PF) 100 MCG/2ML IJ SOLN
INTRAMUSCULAR | Status: AC
  Filled 2017-08-17: qty 2

## 2017-08-17 MED ORDER — MIDAZOLAM HCL 2 MG/2ML IJ SOLN
INTRAMUSCULAR | Status: AC
  Administered 2017-08-17: 16:00:00
  Filled 2017-08-17: qty 2

## 2017-08-17 MED ORDER — HYDROMORPHONE HCL 1 MG/ML IJ SOLN
INTRAMUSCULAR | Status: AC
  Filled 2017-08-17: qty 2

## 2017-08-17 MED ORDER — FENTANYL CITRATE (PF) 100 MCG/2ML IJ SOLN
INTRAMUSCULAR | Status: AC | PRN
  Administered 2017-08-17 (×2): 25 ug via INTRAVENOUS

## 2017-08-17 NOTE — Sedation Documentation (Signed)
Pt is moaning, complains of pain at biopsy site

## 2017-08-17 NOTE — Sedation Documentation (Signed)
Patient is resting

## 2017-08-17 NOTE — Sedation Documentation (Signed)
Vital signs stable. 

## 2017-08-17 NOTE — Sedation Documentation (Signed)
Pt is resting, pt complains of pain 9/10. MD advised, sedation given

## 2017-08-17 NOTE — Progress Notes (Signed)
PROGRESS NOTE                                                                                                                                                                                                             Patient Demographics:    Jesus Watkins, is a 46 y.o. male, DOB - [birth year], ZOX:[id number]  Admit date - 08/15/2017   Admitting Physician Briscoe Deutscher, MD  Outpatient Primary MD for the patient is Placey, Chales Abrahams, NP  LOS - 1  Outpatient Specialists:None  Chief Complaint  Patient presents with  . 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 He was discharged on IV vancomycin for 6 weeks duration. His back pain symptoms have worsened over the past 2 weeks and reports that he had fever of 101F at that rehabilitation. Vitals in the ED was normal. Blood work showed mild leukocytosis and transaminitis. Also had elevated lactic acid. MRI of the lumbar spine shows atypical discitis with osteomyelitis of the level of L3-L4 and extensive paraspinal inflammation. Small epidural abscess within the left foramen and diffuse anterior epidural enhancement noted. Patient admitted to hospitalist service.   Subjective:   Complains of low back pain that is controlled on current pain regimen. Remains afebrile. No tingling or numbness of his extremity is.   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 No neurological deficit, bowel or urinary symptoms (except for constipation for 2 weeks that relieved with enema.) Patient has been able to ambulate without difficulty except limitation with pain. IR consult appreciated. ID consulted for further recommendations. Disc aspirate will be sent for culture.  Pain control with OxyIR and ER. (Dose reduced and added when necessary Toradol).  Avoid IV narcotics and wean oral narcotics as tolerated. Check  2-D echo to rule out vegetation.   Active Problems:  Transaminitis Recheck labs in a.m. Pending hepatitis panel.  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  Constipation Resolved with enema.   tobacco abuse Counseled on cessation.  prediabetes Monitor on sliding-scale coverage    Code Status : Full code  Family Communication  : None at bedside  Disposition Plan  : Pending hospital course  Barriers For Discharge : Active symptoms  Consults  :  IR ID   Procedures  :  CT abdomen MRI lumbar spine Lumbar Disc aspiration (pending)  DVT Prophylaxis  :  Lovenox -   Lab Results  Component Value Date   PLT 445 (H) 08/16/2017    Antibiotics  :   Anti-infectives    Start     Dose/Rate Route Frequency Ordered Stop   08/16/17 1330  vancomycin (VANCOCIN) IVPB 1000 mg/200 mL premix  Status:  Discontinued     1,000 mg 200 mL/hr over 60 Minutes Intravenous Every 8 hours 08/16/17 1305 08/17/17 0759   08/16/17 1330  piperacillin-tazobactam (ZOSYN) IVPB 3.375 g  Status:  Discontinued     3.375 g 12.5 mL/hr over 240 Minutes Intravenous Every 8 hours 08/16/17 1305 08/17/17 0759   08/16/17 1230  piperacillin-tazobactam (ZOSYN) IVPB 3.375 g  Status:  Discontinued     3.375 g 100 mL/hr over 30 Minutes Intravenous Every 6 hours 08/16/17 1221 08/16/17 1255   08/16/17 1230  vancomycin (VANCOCIN) IVPB 1000 mg/200 mL premix  Status:   Discontinued     1,000 mg 200 mL/hr over 60 Minutes Intravenous Every 12 hours 08/16/17 1221 08/16/17 1255   08/16/17 0045  piperacillin-tazobactam (ZOSYN) IVPB 3.375 g     3.375 g 100 mL/hr over 30 Minutes Intravenous  Once 08/16/17 0042 08/16/17 0358   08/16/17 0045  vancomycin (VANCOCIN) IVPB 1000 mg/200 mL premix     1,000 mg 200 mL/hr over 60 Minutes Intravenous  Once 08/16/17 0042 08/16/17 0358        Objective:   Vitals:   08/16/17 0530 08/16/17 1433 08/16/17 1900 08/17/17 0427  BP: 98/72 (!) 109/51 110/70 (!) 105/55  Pulse: (!) 53 74 63 60  Resp: 13  15 16   Temp:  98.7 F (37.1 C) 98.9 F (37.2 C) 98.5 F (36.9 C)  TempSrc:  Oral Oral Oral  SpO2: 100% 96% 100% 100%  Weight:      Height:        Wt Readings from Last 3 Encounters:  08/15/17 81.6 kg (180 lb)  07/28/17 74.8 kg (165 lb)  01/20/17 74.8 kg (165 lb)     Intake/Output Summary (Last 24 hours) at 08/17/17 1336 Last data filed at 08/17/17 1100  Gross per 24 hour  Intake             1450 ml  Output              780 ml  Net              670 ml     Physical Exam  Gen: not in distress HEENT:  moist mucosa, supple neck Chest: clear b/l, no added sounds CVS: N S1&S2, no murmurs,  GI: soft, NT, ND,  Musculoskeletal: warm, Left lumbar paraspinal tenderness, CNS: Alert and oriented, normal strength and tone in lower extremities, no numbness    Data Review:    CBC  Recent Labs Lab 08/15/17 2257 08/16/17 1344  WBC 13.2* 8.6  HGB 13.0 11.8*  HCT 39.4 37.1*  PLT 488* 445*  MCV 84.0 85.3  MCH 27.7 27.1  MCHC 33.0 31.8  RDW 13.9 14.1  LYMPHSABS 1.6  --   MONOABS 1.5*  --   EOSABS 0.1  --   BASOSABS 0.0  --     Chemistries   Recent Labs Lab 08/15/17 2257 08/16/17 1344  NA 133*  --   K 5.1  --   CL 95*  --   CO2 27  --   GLUCOSE 120*  --   BUN 26*  --   CREATININE 0.79 0.84  CALCIUM 9.5  --   AST 82*  --   ALT 106*  --   ALKPHOS 246*  --   BILITOT 0.7  --     ------------------------------------------------------------------------------------------------------------------ No results for input(s): CHOL, HDL, LDLCALC, TRIG, CHOLHDL, LDLDIRECT in the last 72 hours.  Lab Results  Component Value Date   HGBA1C 6.4 (H) 08/16/2017   ------------------------------------------------------------------------------------------------------------------  Recent Labs  08/17/17 0339  TSH 3.900   ------------------------------------------------------------------------------------------------------------------ No results for input(s): VITAMINB12, FOLATE, FERRITIN, TIBC, IRON, RETICCTPCT in the last 72 hours.  Coagulation profile  Recent Labs Lab 08/17/17 1005  INR 1.08    No results for input(s): DDIMER in the last 72 hours.  Cardiac Enzymes No results for input(s): CKMB, TROPONINI, MYOGLOBIN in the last 168 hours.  Invalid input(s): CK ------------------------------------------------------------------------------------------------------------------ No results found for: BNP  Inpatient Medications  Scheduled Meds: . [START ON 08/18/2017] enoxaparin (LOVENOX) injection  40 mg Subcutaneous Q24H  . insulin aspart  0-9 Units Subcutaneous TID WC  . metoCLOPramide (REGLAN) injection  10 mg Intravenous Q6H  . oxyCODONE  15 mg Oral Q12H   Continuous Infusions: PRN Meds:.ibuprofen, ondansetron **OR** ondansetron (ZOFRAN) IV, oxyCODONE, oxyCODONE, sorbitol  Micro Results Recent Results (from the past 240 hour(s))  MRSA PCR Screening     Status: None   Collection Time: 08/16/17 12:09 PM  Result Value Ref Range Status   MRSA by PCR NEGATIVE NEGATIVE Final    Comment:        The GeneXpert MRSA Assay (FDA approved for NASAL specimens only), is one component of a comprehensive MRSA colonization surveillance program. It is not intended to diagnose MRSA infection nor to guide or monitor treatment for MRSA infections.     Radiology  Reports Abd 1 View (kub)  Result Date: 08/17/2017 CLINICAL DATA:  Back pain, constipation despite having multiple bowel movements yesterday. EXAM: ABDOMEN - 1 VIEW COMPARISON:  Abdominal and pelvic CT scan of August 16, 2017 FINDINGS: The colonic stool burden remains increased. There is no evidence of a small bowel obstruction or fecal impaction. No abnormal soft tissue calcifications are observed. The bony structures are unremarkable. IMPRESSION: Increased colonic stool burden compatible with constipation. No evidence of obstruction or fecal impaction. Electronically Signed   By: David  [place] M.D.   On: 08/17/2017 08:50   Mr Lumbar Spine W Wo Contrast  Result Date: 08/16/2017 CLINICAL DATA:  Abdominal and severe back pain. Constipation. History of intravenous drug abuse and endocarditis. CT findings suspicious for L3-4 discitis. EXAM: MRI LUMBAR SPINE WITHOUT AND WITH CONTRAST TECHNIQUE: Multiplanar and multiecho pulse sequences of the lumbar spine were obtained without and with intravenous contrast. CONTRAST:  <See Chart> MULTIHANCE GADOBENATE DIMEGLUMINE 529 MG/ML IV SOLN COMPARISON:  Abdominopelvic CT 08/16/2017. FINDINGS: Segmentation: Conventional anatomy assumed, with the last open disc space designated L5-S1. Alignment:  Normal. Vertebrae: There is diffuse endplate edema and enhancement at L3-4 consistent  with osteomyelitis. The intervening disc does not demonstrate significant T2 hyperintensity or abnormal enhancement. Paraspinal findings are described below. No other significant osseous findings. The visualized sacroiliac joints appear unremarkable. Conus medullaris: Extends to the upper L1 level and appears normal. There is no abnormal intradural enhancement. There is epidural enhancement at L3-4, further described below. Paraspinal and other soft tissues: There is extensive paraspinal inflammation surrounding the L3 and L4 vertebral bodies with fairly homogeneous enhancement following contrast.  There is asymmetric enhancement extending into the left L3-4 foramen. There is a small fluid collection inferiorly in the left L3-4 foramen which may contribute to left L3 nerve root encroachment. No other focal fluid collections are seen. There is anterior epidural enhancement at the L3 and L4 levels. Enhancement extends into the psoas musculature bilaterally. Probable slow flow in the iliac veins without definite DVT. Probable reactive retroperitoneal lymph nodes. Disc levels: No significant disc space findings from T11-12 through L2-3. L3-4: As above, of atypical findings of discitis, osteomyelitis and extensive paraspinal inflammation. There is anterior epidural enhancement and a small epidural abscess inferiorly in the left foramen, likely encroaching on the left L3 nerve root. These factors contribute to mass effect on the thecal sac and asymmetric narrowing of the left lateral recess. Marrow edema extends into the left L4 pedicle. No definite facet joint infection demonstrated. L4-5: Mild disc bulging, facet and ligamentous hypertrophy. No disc herniation, spinal stenosis or nerve root encroachment. L5-S1: There is disc bulging with a broad-based left foraminal disc protrusion. This may contribute to left L5 nerve root encroachment in the canal. Mild bilateral facet hypertrophy. IMPRESSION: 1. MRI confirms the impression of atypical discitis at L3-4 with associated osteomyelitis and extensive paraspinal inflammation. There is a small epidural abscess within the left foramen and diffuse anterior epidural enhancement. 2. No other foci of infection demonstrated. 3. Disc bulging, facet ligamentous hypertrophy at L4-5 without resulting spinal stenosis or nerve root encroachment. 4. Broad-based left foraminal disc protrusion at L5-S1 may contribute to left L5 nerve root encroachment. Electronically Signed   By: Carey Bullocks M.D.   On: 08/16/2017 10:53   Ct Abdomen Pelvis W Contrast  Result Date:  08/16/2017 CLINICAL DATA:  Abdominal and severe low back pain. Abdominal distention. No bowel movement for 2 weeks. Attempted cells enema today by a lotion bottle. EXAM: CT ABDOMEN AND PELVIS WITH CONTRAST TECHNIQUE: Multidetector CT imaging of the abdomen and pelvis was performed using the standard protocol following bolus administration of intravenous contrast. CONTRAST:  ISOVUE-300 IOPAMIDOL (ISOVUE-300) INJECTION 61% COMPARISON:  None. FINDINGS: Lower chest: The lung bases are clear. Hepatobiliary: No focal liver abnormality is seen. No gallstones, gallbladder wall thickening, or biliary dilatation. Pancreas: No ductal dilatation or inflammation. Spleen: Normal in size without focal abnormality. Adrenals/Urinary Tract: Normal adrenal glands. Mild motion artifact through the kidneys. Homogeneous enhancement with symmetric excretion on delayed phase imaging. Stomach/Bowel: Stomach is nondistended. No small bowel inflammation or obstruction. Fecalization of distal small bowel contents. Moderate large volume of stool throughout the entire colon, the distal most sigmoid colon is decompressed. No colonic wall thickening. No obstructing mass. Normal appendix. Vascular/Lymphatic: Aortic atherosclerosis without aneurysm. There are small retroperitoneal lymph nodes, for example aortocaval node measures 9 mm image 35 series 2. Additional small retroperitoneal nodes. Reproductive: Prostate is unremarkable. Other: Retroperitoneal edema and soft tissue stranding, most prominent in the region of L3-L4. No free air, free fluid, or intra-abdominal fluid collection. Musculoskeletal: Abnormal soft tissue density in the region of L3-L4 disc with adjacent  retroperitoneal edema. Semi density extends into bilateral psoas muscles without dominant fluid collection. Question of abnormal epidural density L3-L4 level. There are early endplate changes involving superior L4 and inferior L3 suspicious for osteomyelitis. IMPRESSION: 1.  Findings suspicious for L3-L4 diskitis osteomyelitis with possible epidural component. Recommend lumbar spine MRI, with contrast if no contraindications. 2. Large colonic stool burden and fecalization of distal small bowel contents consistent with constipation and slow transit. 3.  Aortic Atherosclerosis (ICD10-I70.0). These results were called by telephone at the time of interpretation on 08/16/2017 at 12:38 am to Dr. Lynden Oxford , who verbally acknowledged these results. Electronically Signed   By: Rubye Oaks M.D.   On: 08/16/2017 00:39   Dg Knee Complete 4 Views Left  Result Date: 07/28/2017 CLINICAL DATA:  Acute onset of left knee pain after falling off ladder 1 week ago. Initial encounter. EXAM: LEFT KNEE - COMPLETE 4+ VIEW COMPARISON:  None. FINDINGS: There is no evidence of fracture or dislocation. The joint spaces are preserved. No significant degenerative change is seen; the patellofemoral joint is grossly unremarkable in appearance. No significant joint effusion is seen. The visualized soft tissues are normal in appearance. IMPRESSION: No evidence of fracture or dislocation Electronically Signed   By: Roanna Raider M.D.   On: 07/28/2017 21:26   Dg Hip Unilat With Pelvis 2-3 Views Left  Result Date: 07/28/2017 CLINICAL DATA:  Acute onset of left hip pain after falling off ladder. Initial encounter. EXAM: DG HIP (WITH OR WITHOUT PELVIS) 2-3V LEFT COMPARISON:  None. FINDINGS: There is no evidence of fracture or dislocation. Both femoral heads are seated normally within their respective acetabula. The proximal left femur appears intact. No significant degenerative change is appreciated. The sacroiliac joints are unremarkable in appearance. The visualized bowel gas pattern is grossly unremarkable in appearance. IMPRESSION: No evidence of fracture or dislocation. Electronically Signed   By: Roanna Raider M.D.   On: 07/28/2017 21:25    Time Spent in minutes  25   Eddie North M.D on  08/17/2017 at 1:36 PM  Between 7am to 7pm - Pager - [phone]  After 7pm go to [email or web address] - password [facility]  [facility] -  Office  [phone]

## 2017-08-17 NOTE — Sedation Documentation (Signed)
Patient complains of pain.

## 2017-08-17 NOTE — Consult Note (Signed)
Chief Complaint: diskitis of L3-4 disc  Referring Physician:Dr. Flonnie Overman Dhungel  Supervising Physician: Luanne Bras  Patient Status: [facility] [place] - In-pt  HPI: Jesus Watkins is a 46 y.o. male with a history of heroin use.  He was clean for 6 months and then relapsed 3 weeks ago and then checked himself back into rehab.  About 2 weeks ago he started have back pain.  He presented to the ED where he had x-rays that were unrevealing.  His pain continued to worsen and he began to run fevers of around 101 at the rehab facility.  He was then brought back in overnight where he had an MRI that revealed atypical discitis at L3-4 with associated osteomyelitis and extensive paraspinal inflammation. There is a small epidural abscess within the left foramen and diffuse anterior epidural enhancement. We have been asked to see the patient for a disc aspiration for culture. Past Medical History:  Past Medical History:  Diagnosis Date  . Acid reflux   . 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 Essential hypertension   . Hyperglycemia 10/2016  . 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 bilateral arm abscesses    . DENTAL SURGERY    . I&D EXTREMITY Bilateral 11/05/2016   Procedure: IRRIGATION AND DEBRIDEMENT EXTREMITY;  Surgeon: Roseanne Kaufman, MD;  Location: [place];  Service: Orthopedics;  Laterality: Bilateral;  . I&D EXTREMITY Bilateral 11/06/2016   Procedure: IRRIGATION AND DEBRIDEMENT AND REPAIR OF ASSOCIATED STRUCTURES RIGHT AND LEFT ARMS;  Surgeon: Roseanne Kaufman, MD;  Location: [place];  Service: Orthopedics;  Laterality: Bilateral;  . TEE WITHOUT CARDIOVERSION N/A 12/19/2016   Procedure: TRANSESOPHAGEAL ECHOCARDIOGRAM (TEE);  Surgeon: Sanda Klein, MD;  Location: [facility];  Service: Cardiovascular;  Laterality: N/A;    Family History:  Family History  Problem Relation Age of Onset  . Heart attack Mother   . Alcoholism Father   .  Liver disease Father     Social History:  reports that he has been smoking Cigarettes.  He has been smoking about 0.50 packs per day. He has never used smokeless tobacco. He reports that he uses drugs, including IV and Heroin. He reports that he does not drink alcohol.  Allergies: No Known Allergies  Medications: Medications reviewed in epic  Please HPI for pertinent positives, otherwise complete 10 system ROS negative, except for constipation which is new.  History of CVA, but fully recovered.  Mallampati Score: MD Evaluation Airway: WNL Heart: WNL Abdomen: WNL Chest/ Lungs: WNL ASA  Classification: 3 Mallampati/Airway Score: One  Physical Exam: BP (!) 105/55 (BP Location: Left Arm)   Pulse 60   Temp 98.5 F (36.9 C) (Oral)   Resp 16   Ht 5' 7"  (1.702 m)   Wt 180 lb (81.6 kg)   SpO2 100%   BMI 28.19 kg/m   Body mass index is 28.19 kg/m. General: pleasant, WD, WN white male who is laying in bed in NAD HEENT: head is normocephalic, atraumatic.  Sclera are noninjected.  PERRL.  Ears and nose without any masses or lesions.  Mouth is pink and moist Heart: regular, rate, and rhythm.  Normal s1,s2. No obvious murmurs, gallops, or rubs noted.  Palpable radial pulses bilaterally Lungs: CTAB, no wheezes, rhonchi, or rales noted.  Respiratory effort nonlabored Abd: soft, NT, ND, +BS, no masses, hernias, or organomegaly Psych: A&Ox3 with an appropriate affect.   Labs: Results for orders placed or performed during the  hospital encounter of 08/15/17 (from the past 48 hour(s))  CBC with Differential     Status: Abnormal   Collection Time: 08/15/17 10:57 PM  Result Value Ref Range   WBC 13.2 (H) 4.0 - 10.5 K/uL   RBC 4.69 4.22 - 5.81 MIL/uL   Hemoglobin 13.0 13.0 - 17.0 g/dL   HCT 39.4 39.0 - 52.0 %   MCV 84.0 78.0 - 100.0 fL   MCH 27.7 26.0 - 34.0 pg   MCHC 33.0 30.0 - 36.0 g/dL   RDW 13.9 11.5 - 15.5 %   Platelets 488 (H) 150 - 400 K/uL   Neutrophils Relative % 76 %    Neutro Abs 10.1 (H) 1.7 - 7.7 K/uL   Lymphocytes Relative 12 %   Lymphs Abs 1.6 0.7 - 4.0 K/uL   Monocytes Relative 11 %   Monocytes Absolute 1.5 (H) 0.1 - 1.0 K/uL   Eosinophils Relative 1 %   Eosinophils Absolute 0.1 0.0 - 0.7 K/uL   Basophils Relative 0 %   Basophils Absolute 0.0 0.0 - 0.1 K/uL  Comprehensive metabolic panel     Status: Abnormal   Collection Time: 08/15/17 10:57 PM  Result Value Ref Range   Sodium 133 (L) 135 - 145 mmol/L   Potassium 5.1 3.5 - 5.1 mmol/L   Chloride 95 (L) 101 - 111 mmol/L   CO2 27 22 - 32 mmol/L   Glucose, Bld 120 (H) 65 - 99 mg/dL   BUN 26 (H) 6 - 20 mg/dL   Creatinine, Ser 0.79 0.61 - 1.24 mg/dL   Calcium 9.5 8.9 - 10.3 mg/dL   Total Protein 8.8 (H) 6.5 - 8.1 g/dL   Albumin 3.7 3.5 - 5.0 g/dL   AST 82 (H) 15 - 41 U/L   ALT 106 (H) 17 - 63 U/L   Alkaline Phosphatase 246 (H) 38 - 126 U/L   Total Bilirubin 0.7 0.3 - 1.2 mg/dL   GFR calc non Af Amer >60 >60 mL/min   GFR calc Af Amer >60 >60 mL/min    Comment: (NOTE) The eGFR has been calculated using the CKD EPI equation. This calculation has not been validated in all clinical situations. eGFR's persistently <60 mL/min signify possible Chronic Kidney Disease.    Anion gap 11 5 - 15  Lipase, blood     Status: None   Collection Time: 08/15/17 10:57 PM  Result Value Ref Range   Lipase 19 11 - 51 U/L  I-Stat CG4 Lactic Acid, ED     Status: None   Collection Time: 08/15/17 11:09 PM  Result Value Ref Range   Lactic Acid, Venous 1.46 0.5 - 1.9 mmol/L  I-Stat CG4 Lactic Acid, ED     Status: Abnormal   Collection Time: 08/16/17 12:57 AM  Result Value Ref Range   Lactic Acid, Venous 2.85 (HH) 0.5 - 1.9 mmol/L   Comment NOTIFIED PHYSICIAN   I-Stat CG4 Lactic Acid, ED     Status: None   Collection Time: 08/16/17  4:48 AM  Result Value Ref Range   Lactic Acid, Venous 0.75 0.5 - 1.9 mmol/L  MRSA PCR Screening     Status: None   Collection Time: 08/16/17 12:09 PM  Result Value Ref Range    MRSA by PCR NEGATIVE NEGATIVE    Comment:        The GeneXpert MRSA Assay (FDA approved for NASAL specimens only), is one component of a comprehensive MRSA colonization surveillance program. It is not intended to diagnose MRSA  infection nor to guide or monitor treatment for MRSA infections.   CBC     Status: Abnormal   Collection Time: 08/16/17  1:44 PM  Result Value Ref Range   WBC 8.6 4.0 - 10.5 K/uL   RBC 4.35 4.22 - 5.81 MIL/uL   Hemoglobin 11.8 (L) 13.0 - 17.0 g/dL   HCT 37.1 (L) 39.0 - 52.0 %   MCV 85.3 78.0 - 100.0 fL   MCH 27.1 26.0 - 34.0 pg   MCHC 31.8 30.0 - 36.0 g/dL   RDW 14.1 11.5 - 15.5 %   Platelets 445 (H) 150 - 400 K/uL  Creatinine, serum     Status: None   Collection Time: 08/16/17  1:44 PM  Result Value Ref Range   Creatinine, Ser 0.84 0.61 - 1.24 mg/dL   GFR calc non Af Amer >60 >60 mL/min   GFR calc Af Amer >60 >60 mL/min    Comment: (NOTE) The eGFR has been calculated using the CKD EPI equation. This calculation has not been validated in all clinical situations. eGFR's persistently <60 mL/min signify possible Chronic Kidney Disease.   Hemoglobin A1c     Status: Abnormal   Collection Time: 08/16/17  1:44 PM  Result Value Ref Range   Hgb A1c MFr Bld 6.4 (H) 4.8 - 5.6 %    Comment: (NOTE) Pre diabetes:          5.7%-6.4% Diabetes:              >6.4% Glycemic control for   <7.0% adults with diabetes    Mean Plasma Glucose 136.98 mg/dL  Glucose, capillary     Status: Abnormal   Collection Time: 08/16/17  4:45 PM  Result Value Ref Range   Glucose-Capillary 164 (H) 65 - 99 mg/dL  Glucose, capillary     Status: None   Collection Time: 08/16/17  9:31 PM  Result Value Ref Range   Glucose-Capillary 84 65 - 99 mg/dL  TSH     Status: None   Collection Time: 08/17/17  3:39 AM  Result Value Ref Range   TSH 3.900 0.350 - 4.500 uIU/mL    Comment: Performed by a 3rd Generation assay with a functional sensitivity of <=0.01 uIU/mL.  Glucose, capillary      Status: None   Collection Time: 08/17/17  6:31 AM  Result Value Ref Range   Glucose-Capillary 67 65 - 99 mg/dL  Protime-INR     Status: None   Collection Time: 08/17/17 10:05 AM  Result Value Ref Range   Prothrombin Time 14.0 11.4 - 15.2 seconds   INR 1.08     Imaging: Abd 1 View (kub)  Result Date: 08/17/2017 CLINICAL DATA:  Back pain, constipation despite having multiple bowel movements yesterday. EXAM: ABDOMEN - 1 VIEW COMPARISON:  Abdominal and pelvic CT scan of August 16, 2017 FINDINGS: The colonic stool burden remains increased. There is no evidence of a small bowel obstruction or fecal impaction. No abnormal soft tissue calcifications are observed. The bony structures are unremarkable. IMPRESSION: Increased colonic stool burden compatible with constipation. No evidence of obstruction or fecal impaction. Electronically Signed   By: David  Martinique M.D.   On: 08/17/2017 08:50   Mr Lumbar Spine W Wo Contrast  Result Date: 08/16/2017 CLINICAL DATA:  Abdominal and severe back pain. Constipation. History of intravenous drug abuse and endocarditis. CT findings suspicious for L3-4 discitis. EXAM: MRI LUMBAR SPINE WITHOUT AND WITH CONTRAST TECHNIQUE: Multiplanar and multiecho pulse sequences of the lumbar spine were  obtained without and with intravenous contrast. CONTRAST:  <See Chart> MULTIHANCE GADOBENATE DIMEGLUMINE 529 MG/ML IV SOLN COMPARISON:  Abdominopelvic CT 08/16/2017. FINDINGS: Segmentation: Conventional anatomy assumed, with the last open disc space designated L5-S1. Alignment:  Normal. Vertebrae: There is diffuse endplate edema and enhancement at L3-4 consistent with osteomyelitis. The intervening disc does not demonstrate significant T2 hyperintensity or abnormal enhancement. Paraspinal findings are described below. No other significant osseous findings. The visualized sacroiliac joints appear unremarkable. Conus medullaris: Extends to the upper L1 level and appears normal. There is no  abnormal intradural enhancement. There is epidural enhancement at L3-4, further described below. Paraspinal and other soft tissues: There is extensive paraspinal inflammation surrounding the L3 and L4 vertebral bodies with fairly homogeneous enhancement following contrast. There is asymmetric enhancement extending into the left L3-4 foramen. There is a small fluid collection inferiorly in the left L3-4 foramen which may contribute to left L3 nerve root encroachment. No other focal fluid collections are seen. There is anterior epidural enhancement at the L3 and L4 levels. Enhancement extends into the psoas musculature bilaterally. Probable slow flow in the iliac veins without definite DVT. Probable reactive retroperitoneal lymph nodes. Disc levels: No significant disc space findings from T11-12 through L2-3. L3-4: As above, of atypical findings of discitis, osteomyelitis and extensive paraspinal inflammation. There is anterior epidural enhancement and a small epidural abscess inferiorly in the left foramen, likely encroaching on the left L3 nerve root. These factors contribute to mass effect on the thecal sac and asymmetric narrowing of the left lateral recess. Marrow edema extends into the left L4 pedicle. No definite facet joint infection demonstrated. L4-5: Mild disc bulging, facet and ligamentous hypertrophy. No disc herniation, spinal stenosis or nerve root encroachment. L5-S1: There is disc bulging with a broad-based left foraminal disc protrusion. This may contribute to left L5 nerve root encroachment in the canal. Mild bilateral facet hypertrophy. IMPRESSION: 1. MRI confirms the impression of atypical discitis at L3-4 with associated osteomyelitis and extensive paraspinal inflammation. There is a small epidural abscess within the left foramen and diffuse anterior epidural enhancement. 2. No other foci of infection demonstrated. 3. Disc bulging, facet ligamentous hypertrophy at L4-5 without resulting spinal  stenosis or nerve root encroachment. 4. Broad-based left foraminal disc protrusion at L5-S1 may contribute to left L5 nerve root encroachment. Electronically Signed   By: Richardean Sale M.D.   On: 08/16/2017 10:53   Ct Abdomen Pelvis W Contrast  Result Date: 08/16/2017 CLINICAL DATA:  Abdominal and severe low back pain. Abdominal distention. No bowel movement for 2 weeks. Attempted cells enema today by a lotion bottle. EXAM: CT ABDOMEN AND PELVIS WITH CONTRAST TECHNIQUE: Multidetector CT imaging of the abdomen and pelvis was performed using the standard protocol following bolus administration of intravenous contrast. CONTRAST:  126m ISOVUE-300 IOPAMIDOL (ISOVUE-300) INJECTION 61% COMPARISON:  None. FINDINGS: Lower chest: The lung bases are clear. Hepatobiliary: No focal liver abnormality is seen. No gallstones, gallbladder wall thickening, or biliary dilatation. Pancreas: No ductal dilatation or inflammation. Spleen: Normal in size without focal abnormality. Adrenals/Urinary Tract: Normal adrenal glands. Mild motion artifact through the kidneys. Homogeneous enhancement with symmetric excretion on delayed phase imaging. Stomach/Bowel: Stomach is nondistended. No small bowel inflammation or obstruction. Fecalization of distal small bowel contents. Moderate large volume of stool throughout the entire colon, the distal most sigmoid colon is decompressed. No colonic wall thickening. No obstructing mass. Normal appendix. Vascular/Lymphatic: Aortic atherosclerosis without aneurysm. There are small retroperitoneal lymph nodes, for example aortocaval node measures 9  mm image 35 series 2. Additional small retroperitoneal nodes. Reproductive: Prostate is unremarkable. Other: Retroperitoneal edema and soft tissue stranding, most prominent in the region of L3-L4. No free air, free fluid, or intra-abdominal fluid collection. Musculoskeletal: Abnormal soft tissue density in the region of L3-L4 disc with adjacent  retroperitoneal edema. Semi density extends into bilateral psoas muscles without dominant fluid collection. Question of abnormal epidural density L3-L4 level. There are early endplate changes involving superior L4 and inferior L3 suspicious for osteomyelitis. IMPRESSION: 1. Findings suspicious for L3-L4 diskitis osteomyelitis with possible epidural component. Recommend lumbar spine MRI, with contrast if no contraindications. 2. Large colonic stool burden and fecalization of distal small bowel contents consistent with constipation and slow transit. 3.  Aortic Atherosclerosis (ICD10-I70.0). These results were called by telephone at the time of interpretation on 08/16/2017 at 12:38 am to Dr. Marda Stalker , who verbally acknowledged these results. Electronically Signed   By: Jeb Levering M.D.   On: 08/16/2017 00:39    Assessment/Plan 1. L3-4 diskitis  We will plan to proceed with a disc aspiration at this level today if schedule allows.  He is NPO.  He ate at 0700 this morning.  The procedure would have to be done at least sometime after 1300.  Labs have all been reviewed. Risks and benefits discussed with the patient including bleeding, infection, damage to adjacent structures. All of the patient's questions were answered, patient is agreeable to proceed. Consent signed and in chart.  Thank you for this interesting consult.  I greatly enjoyed meeting Jesus Watkins and look forward to participating in their care.  A copy of this report was sent to the requesting provider on this date.  Electronically Signed: Henreitta Cea 08/17/2017, 11:02 AM   I spent a total of 40 Minutes    in face to face in clinical consultation, greater than 50% of which was counseling/coordinating care for L3-4 diskitis

## 2017-08-17 NOTE — Procedures (Addendum)
S/P L3 L4 disc aspiration under fluoro x 2 passes

## 2017-08-17 NOTE — Consult Note (Signed)
[facility]    Date of Admission:  08/15/2017          Reason for Consult: Lumbar infection    Referring Provider: Dr. Theda Belfast Dhungel  Assessment: He has lumbar infection, most likely as a result of his recent injecting drug use. I agree withholding antibiotic therapy pending results of his lumbar aspirate.   Plan: 1. Hold antibiotics pending lumbar aspirate 2. 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 bupivacaine      . [START ON 08/18/2017] enoxaparin (LOVENOX) injection  40 mg Subcutaneous Q24H  . fentaNYL (SUBLIMAZE) injection  25 mcg Intravenous Once  . insulin aspart  0-9 Units Subcutaneous TID WC  . metoCLOPramide (REGLAN) injection  10 mg Intravenous Q6H  . midazolam      . oxyCODONE  10 mg Oral Q12H    HPI: Jesus Watkins is a 46 y.o. 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 He was presumed to have left-sided endocarditis and treated with 6 weeks of IV vancomycin in a skilled nursing facility. He had complete neurologic recovery. He entered into drug treatment and was drug free for 6 months before relapsing recently with IV heroin use. Shortly after starting again that he begin develop severe low back pain with radiation down his left leg, abdominal pain and constipation. He was also having intermittent fevers and chills. He decided to quit using drugs again and entered day [facility] treatment facility for 4 days. Because of severe pain he was admitted here and found to have L3-for discitis, osteomyelitis and abscess. He was initially started on vancomycin and piperacillin tazobactam but this was held  for interventional radiology to aspirate the disc space. Blood cultures are negative.   Review of Systems: Review of Systems  Constitutional: Positive for chills, fever and malaise/fatigue. Negative for diaphoresis and weight loss.  HENT: Negative for sore throat.   Respiratory: Negative for cough, sputum production and shortness of breath.   Cardiovascular: Negative for chest pain.  Gastrointestinal: Positive for constipation. Negative for abdominal pain, diarrhea, nausea and vomiting.  Genitourinary: Negative for dysuria.  Musculoskeletal: Positive for back pain and joint pain. Negative for myalgias.  Skin: Negative for rash.  Neurological: Negative for dizziness, sensory change and focal weakness.  Psychiatric/Behavioral: Positive for substance abuse. Negative for depression.    Past Medical History:  Diagnosis Date  . Acid reflux   . 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 Essential hypertension   . Hyperglycemia 10/2016  . 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 Smoking status: Current Every Day Smoker    Packs/day: 0.50    Types: Cigarettes  . Smokeless tobacco: Never Used     Comment: Has not smoked since 07/29/2017 due to illness  . Alcohol use No    Family History  Problem Relation Age of Onset  . Heart attack Mother   . Alcoholism Father   . Liver disease Father    No Known Allergies  OBJECTIVE: Blood pressure 112/66, pulse 75, temperature 98.1 F (36.7 C), temperature source Oral, resp. rate  16, height 5\' 7"  (1.702 m), weight 180 lb (81.6 kg), SpO2 98 %.  Physical Exam  Constitutional: He is oriented to person, place, and time.  He is in relatively good spirits resting quietly in bed.  HENT:  Mouth/Throat: No oropharyngeal exudate.  Cardiovascular: Normal rate and regular rhythm.   No murmur heard. Pulmonary/Chest: Effort normal and breath sounds normal. He has no wheezes. He has no rales.  Abdominal: Soft. He exhibits no  distension. There is no tenderness.  Musculoskeletal: Normal range of motion. He exhibits no edema or tenderness.  Neurological: He is alert and oriented to person, place, and time.  Skin: No rash noted.  Psychiatric: Mood and affect normal.    Lab Results Lab Results  Component Value Date   WBC 8.6 08/16/2017   HGB 11.8 (L) 08/16/2017   HCT 37.1 (L) 08/16/2017   MCV 85.3 08/16/2017   PLT 445 (H) 08/16/2017    Lab Results  Component Value Date   CREATININE 0.84 08/16/2017   BUN 26 (H) 08/15/2017   NA 133 (L) 08/15/2017   K 5.1 08/15/2017   CL 95 (L) 08/15/2017   CO2 27 08/15/2017    Lab Results  Component Value Date   ALT 106 (H) 08/15/2017   AST 82 (H) 08/15/2017   ALKPHOS 246 (H) 08/15/2017   BILITOT 0.7 08/15/2017     Microbiology: Recent Results (from the past 240 hour(s))  Blood Culture (routine x 2)     Status: None (Preliminary result)   Collection Time: 08/16/17  1:35 AM  Result Value Ref Range Status   Specimen Description BLOOD RIGHT ARM  Final   Special Requests   Final    BOTTLES DRAWN AEROBIC AND ANAEROBIC Blood Culture results may not be optimal due to an excessive volume of blood received in culture bottles   Culture   Final    NO GROWTH 1 DAY Performed at [facility], [address], [place], [place] [address]    Report Status PENDING  Incomplete  Blood Culture (routine x 2)     Status: None (Preliminary result)   Collection Time: 08/16/17  1:40 AM  Result Value Ref Range Status   Specimen Description BLOOD RIGHT ARM  Final   Special Requests IN PEDIATRIC BOTTLE Blood Culture adequate volume  Final   Culture   Final    NO GROWTH 1 DAY Performed at [facility], [address], [place], [place] [address]    Report Status PENDING  Incomplete  MRSA PCR Screening     Status: None   Collection Time: 08/16/17 12:09 PM  Result Value Ref Range Status   MRSA by PCR NEGATIVE NEGATIVE Final    Comment:        The GeneXpert MRSA Assay  (FDA approved for NASAL specimens only), is one component of a comprehensive MRSA colonization surveillance program. It is not intended to diagnose MRSA infection nor to guide or monitor treatment for MRSA infections.     Cliffton Asters, MD [facility] [phone] pager   [phone] cell 08/17/2017, 3:58 PM

## 2017-08-18 ENCOUNTER — Other Ambulatory Visit (HOSPITAL_COMMUNITY): Payer: Self-pay

## 2017-08-18 ENCOUNTER — Encounter (HOSPITAL_COMMUNITY): Payer: Self-pay | Admitting: Interventional Radiology

## 2017-08-18 ENCOUNTER — Inpatient Hospital Stay (HOSPITAL_COMMUNITY): Payer: Self-pay

## 2017-08-18 DIAGNOSIS — I330 Acute and subacute infective endocarditis: Secondary | ICD-10-CM

## 2017-08-18 LAB — CBC
HEMATOCRIT: 33.7 % — AB (ref 39.0–52.0)
HEMOGLOBIN: 11.2 g/dL — AB (ref 13.0–17.0)
MCH: 27.9 pg (ref 26.0–34.0)
MCHC: 33.2 g/dL (ref 30.0–36.0)
MCV: 84 fL (ref 78.0–100.0)
Platelets: 372 10*3/uL (ref 150–400)
RBC: 4.01 MIL/uL — ABNORMAL LOW (ref 4.22–5.81)
RDW: 14.1 % (ref 11.5–15.5)
WBC: 9.2 10*3/uL (ref 4.0–10.5)

## 2017-08-18 LAB — HEPATITIS PANEL, ACUTE
HCV Ab: >11 s/co ratio — ABNORMAL HIGH (ref 0.0–0.9)
HEP A IGM: NEGATIVE
HEP B C IGM: NEGATIVE
HEP B S AG: NEGATIVE

## 2017-08-18 LAB — COMPREHENSIVE METABOLIC PANEL
ALBUMIN: 3 g/dL — AB (ref 3.5–5.0)
ALT: 90 U/L — ABNORMAL HIGH (ref 17–63)
ANION GAP: 10 (ref 5–15)
AST: 77 U/L — AB (ref 15–41)
Alkaline Phosphatase: 190 U/L — ABNORMAL HIGH (ref 38–126)
BUN: 13 mg/dL (ref 6–20)
CHLORIDE: 99 mmol/L — AB (ref 101–111)
CO2: 23 mmol/L (ref 22–32)
Calcium: 8.8 mg/dL — ABNORMAL LOW (ref 8.9–10.3)
Creatinine, Ser: 0.75 mg/dL (ref 0.61–1.24)
GFR calc Af Amer: >60 mL/min (ref >60)
Glucose, Bld: 82 mg/dL (ref 65–99)
POTASSIUM: 4.3 mmol/L (ref 3.5–5.1)
Sodium: 132 mmol/L — ABNORMAL LOW (ref 135–145)
Total Bilirubin: 0.7 mg/dL (ref 0.3–1.2)
Total Protein: 7 g/dL (ref 6.5–8.1)

## 2017-08-18 LAB — ACID FAST SMEAR (AFB): ACID FAST SMEAR - AFSCU2: NEGATIVE

## 2017-08-18 LAB — ECHOCARDIOGRAM COMPLETE
Height: 67 in
WEIGHTICAEL: 2880 [oz_av]

## 2017-08-18 LAB — GLUCOSE, CAPILLARY
GLUCOSE-CAPILLARY: 105 mg/dL — AB (ref 65–99)
GLUCOSE-CAPILLARY: 118 mg/dL — AB (ref 65–99)
Glucose-Capillary: 153 mg/dL — ABNORMAL HIGH (ref 65–99)
Glucose-Capillary: 81 mg/dL (ref 65–99)

## 2017-08-18 LAB — HIV ANTIBODY (ROUTINE TESTING W REFLEX): HIV Screen 4th Generation wRfx: NONREACTIVE

## 2017-08-18 MED ORDER — VANCOMYCIN HCL IN DEXTROSE 1-5 GM/200ML-% IV SOLN
1000.0000 mg | Freq: Three times a day (TID) | INTRAVENOUS | Status: DC
  Administered 2017-08-18 – 2017-08-19 (×3): 1000 mg via INTRAVENOUS
  Filled 2017-08-18 (×5): qty 200

## 2017-08-18 NOTE — Progress Notes (Signed)
  Echocardiogram 2D Echocardiogram has been performed.  Jesus Watkins 08/18/2017, 10:12 AM

## 2017-08-18 NOTE — Progress Notes (Signed)
Lab called with culture of lumbar aspiration growing staph auereus. MD notified.   Sim Boast, RN

## 2017-08-18 NOTE — Progress Notes (Signed)
Pharmacy Antibiotic Note  Jesus Watkins is a 46 y.o. male admitted on 08/15/2017 with Discitis.  Pharmacy has been consulted for Vancomycin dosing.  Pt with know hx IVDU and prior endocarditis.  CT (+)discitis, osteomyelitis of lumbar spine  Pt received Vancomycin 1000mg  IV and Zosyn 3.375g IV in ED ~2AM today. Was on vancomycin 1000 mg every 8 hours which was stopped and held on 8/27 prior to lumbar aspirate. Cx growing moderate staph aureus, sensitivities to follow. Discussed with ID service to restart antibiotics. Renal function remains stable.   Plan: Continue Vancomycin 1000mg  IV q8 Obtain trough on 8/29 1100 (prior to 4th dose) Watch renal fxn, clinical picture, and cx results  Height: 5\' 7"  (170.2 cm) Weight: 180 lb (81.6 kg) IBW/kg (Calculated) : 66.1  Temp (24hrs), Avg:98.6 F (37 C), Min:98.1 F (36.7 C), Max:99 F (37.2 C)   Recent Labs Lab 08/15/17 2257 08/15/17 2309 08/16/17 0057 08/16/17 0448 08/16/17 1344 08/18/17 0618  WBC 13.2*  --   --   --  8.6 9.2  CREATININE 0.79  --   --   --  0.84 0.75  LATICACIDVEN  --  1.46 2.85* 0.75  --   --     Estimated Creatinine Clearance: 119.2 mL/min (by C-G formula based on SCr of 0.75 mg/dL).    No Known Allergies  Antimicrobials this admission: 8/26 Vanc >> held 8/27 for lumbar aspirate>> restarted 8/28 8/26 Zosyn >> 8/27  Dose adjustments this admission:   Microbiology results: 8/26 BCx: ntd 8/26 MRSA PCR: neg 8/27 Disc fluid: mod staph aureus  Thank you for allowing pharmacy to be a part of this patient's care.  Girard Cooter, PharmD Clinical Pharmacist  Pager: [phone] [facility]

## 2017-08-18 NOTE — Progress Notes (Signed)
PROGRESS NOTE                                                                                                                                                                                                             Patient Demographics:    Jesus Watkins, is a 46 y.o. male, DOB - 03-06-1971, ZOX:[id number]  Admit date - 08/15/2017   Admitting Physician Briscoe Deutscher, MD  Outpatient Primary MD for the patient is Placey, Chales Abrahams, NP  LOS - 2  Outpatient Specialists:None  Chief Complaint  Patient presents with  . 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 He was discharged on IV vancomycin for 6 weeks duration. His back pain symptoms have worsened over the past 2 weeks and reports that he had fever of 101F at that rehabilitation. Vitals in the ED was normal. Blood work showed mild leukocytosis and transaminitis. Also had elevated lactic acid. MRI of the lumbar spine shows atypical discitis with osteomyelitis of the level of L3-L4 and extensive paraspinal inflammation. Small epidural abscess within the left foramen and diffuse anterior epidural enhancement noted. Patient admitted to hospitalist service.   Subjective:   Low back pain persistent but seemed to tolerate current pain medication. No weakness or numbness in the extremities. Underwent lumbar disc aspirate yesterday. Cultures preliminary growing staph aureus. Not enough samples for cell count and differential.    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 No neurological deficit, bowel or urinary symptoms. Pain control with OxyIR and ER (stable on reduced dose). Avoid IV narcotics. 2-D echo done to rule out vegetation.  Lumbar disc aspirated by IR on 8/27, preliminary growing moderate staph aureus. Culture sent for fungus and AFB.Marland Kitchen Antibiotic recommendations per ID.    Active Problems:  Transaminitis Improving in a.m. labs.  Hep C antibody pending.  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  Constipation Resolved with enema.   tobacco abuse Counseled on cessation.  prediabetes Monitor on sliding-scale coverage    Code Status : Full code  Family Communication  : None at bedside  Disposition Plan  : Pending final culture results and antibiotic course determined.  Barriers For Discharge : Active symptoms  Consults  :  IR ID   Procedures  :  CT abdomen MRI lumbar spine Lumbar Disc aspiration   DVT Prophylaxis  :  Lovenox -   Lab Results  Component Value Date   PLT 372 08/18/2017    Antibiotics  :   Anti-infectives    Start     Dose/Rate Route Frequency Ordered Stop   08/18/17 1130  vancomycin (VANCOCIN) IVPB 1000 mg/200 mL premix     1,000 mg 200 mL/hr over 60 Minutes Intravenous Every 8 hours 08/18/17 1049     08/16/17 1330  vancomycin (VANCOCIN) IVPB 1000 mg/200 mL premix  Status:  Discontinued     1,000 mg 200 mL/hr over 60 Minutes Intravenous Every 8 hours 08/16/17 1305 08/17/17 0759   08/16/17 1330  piperacillin-tazobactam (ZOSYN) IVPB 3.375 g  Status:  Discontinued     3.375 g 12.5 mL/hr over 240 Minutes Intravenous Every 8 hours 08/16/17 1305 08/17/17 0759   08/16/17 1230  piperacillin-tazobactam (ZOSYN) IVPB 3.375 g  Status:  Discontinued     3.375 g 100 mL/hr over 30 Minutes Intravenous Every 6 hours 08/16/17 1221  08/16/17 1255   08/16/17 1230  vancomycin (VANCOCIN) IVPB 1000 mg/200 mL premix  Status:  Discontinued     1,000 mg 200 mL/hr over 60 Minutes Intravenous Every 12 hours 08/16/17 1221 08/16/17 1255   08/16/17 0045  piperacillin-tazobactam (ZOSYN) IVPB 3.375 g     3.375 g 100 mL/hr over 30 Minutes Intravenous  Once 08/16/17 0042 08/16/17 0358   08/16/17 0045  vancomycin (VANCOCIN) IVPB 1000 mg/200 mL premix     1,000 mg 200 mL/hr over 60 Minutes Intravenous  Once 08/16/17 0042 08/16/17 0358        Objective:   Vitals:   08/17/17 1623 08/17/17 1628 08/17/17 2209 08/18/17 0452  BP: 130/78 126/79 115/65 (!) 124/55  Pulse: 89 86 85 75  Resp: 12 12  18   Temp:   98.8 F (37.1 C) 99 F (37.2 C)  TempSrc:   Oral Oral  SpO2: 99% 100% 99% 100%  Weight:      Height:        Wt Readings from Last 3 Encounters:  08/15/17 81.6 kg (180 lb)  07/28/17 74.8 kg (165 lb)  01/20/17 74.8 kg (165 lb)     Intake/Output Summary (Last 24 hours) at 08/18/17 1143 Last data filed at 08/18/17 0830  Gross per 24 hour  Intake              480 ml  Output                0 ml  Net              480 ml     Physical Exam  Gen: not in distress HEENT:  moist mucosa, supple neck Chest: clear b/l, no added sounds CVS: N S1&S2, no murmurs,  GI: soft, NT, ND,  Musculoskeletal: warm, Left lumbar paraspinal tenderness,(better from yesterday) CNS:  no lower extremity neurological deficit    Data Review:  CBC  Recent Labs Lab 08/15/17 2257 08/16/17 1344 08/18/17 0618  WBC 13.2* 8.6 9.2  HGB 13.0 11.8* 11.2*  HCT 39.4 37.1* 33.7*  PLT 488* 445* 372  MCV 84.0 85.3 84.0  MCH 27.7 27.1 27.9  MCHC 33.0 31.8 33.2  RDW 13.9 14.1 14.1  LYMPHSABS 1.6  --   --   MONOABS 1.5*  --   --   EOSABS 0.1  --   --   BASOSABS 0.0  --   --     Chemistries   Recent Labs Lab 08/15/17 2257 08/16/17 1344 08/18/17 0618  NA 133*  --  132*  K 5.1  --  4.3  CL 95*  --  99*  CO2 27  --  23  GLUCOSE 120*   --  82  BUN 26*  --  13  CREATININE 0.79 0.84 0.75  CALCIUM 9.5  --  8.8*  AST 82*  --  77*  ALT 106*  --  90*  ALKPHOS 246*  --  190*  BILITOT 0.7  --  0.7   ------------------------------------------------------------------------------------------------------------------ No results for input(s): CHOL, HDL, LDLCALC, TRIG, CHOLHDL, LDLDIRECT in the last 72 hours.  Lab Results  Component Value Date   HGBA1C 6.4 (H) 08/16/2017   ------------------------------------------------------------------------------------------------------------------  Recent Labs  08/17/17 0339  TSH 3.900   ------------------------------------------------------------------------------------------------------------------ No results for input(s): VITAMINB12, FOLATE, FERRITIN, TIBC, IRON, RETICCTPCT in the last 72 hours.  Coagulation profile  Recent Labs Lab 08/17/17 1005  INR 1.08    No results for input(s): DDIMER in the last 72 hours.  Cardiac Enzymes No results for input(s): CKMB, TROPONINI, MYOGLOBIN in the last 168 hours.  Invalid input(s): CK ------------------------------------------------------------------------------------------------------------------ No results found for: BNP  Inpatient Medications  Scheduled Meds: . enoxaparin (LOVENOX) injection  40 mg Subcutaneous Q24H  . insulin aspart  0-9 Units Subcutaneous TID WC  . metoCLOPramide (REGLAN) injection  10 mg Intravenous Q6H  . oxyCODONE  10 mg Oral Q12H   Continuous Infusions: . vancomycin     PRN Meds:.ketorolac, ondansetron **OR** ondansetron (ZOFRAN) IV, oxyCODONE, sorbitol  Micro Results Recent Results (from the past 240 hour(s))  Blood Culture (routine x 2)     Status: None (Preliminary result)   Collection Time: 08/16/17  1:35 AM  Result Value Ref Range Status   Specimen Description BLOOD RIGHT ARM  Final   Special Requests   Final    BOTTLES DRAWN AEROBIC AND ANAEROBIC Blood Culture results may not be optimal  due to an excessive volume of blood received in culture bottles   Culture   Final    NO GROWTH 1 DAY Performed at [facility], [address], [place], [place] [address]    Report Status PENDING  Incomplete  Blood Culture (routine x 2)     Status: None (Preliminary result)   Collection Time: 08/16/17  1:40 AM  Result Value Ref Range Status   Specimen Description BLOOD RIGHT ARM  Final   Special Requests IN PEDIATRIC BOTTLE Blood Culture adequate volume  Final   Culture   Final    NO GROWTH 1 DAY Performed at [facility], [address] [place], [place] [address]    Report Status PENDING  Incomplete  MRSA PCR Screening     Status: None   Collection Time: 08/16/17 12:09 PM  Result Value Ref Range Status   MRSA by PCR NEGATIVE NEGATIVE Final    Comment:        The  GeneXpert MRSA Assay (FDA approved for NASAL specimens only), is one component of a comprehensive MRSA colonization surveillance program. It is not intended to diagnose MRSA infection nor to guide or monitor treatment for MRSA infections.   Body fluid culture     Status: None (Preliminary result)   Collection Time: 08/17/17  5:06 PM  Result Value Ref Range Status   Specimen Description FLUID  Final   Special Requests DISC L3 L4  Final   Gram Stain   Final    RARE WBC PRESENT,BOTH PMN AND MONONUCLEAR NO ORGANISMS SEEN    Culture   Final    MODERATE STAPHYLOCOCCUS AUREUS SUSCEPTIBILITIES TO FOLLOW CRITICAL RESULT CALLED TO, READ BACK BY AND VERIFIED WITH: H. MACINTOSH RN, AT 1041 08/18/17 BY D. VANHOOK REGARDING CULTURE GROWTH    Report Status PENDING  Incomplete    Radiology Reports Abd 1 View (kub)  Result Date: 08/17/2017 CLINICAL DATA:  Back pain, constipation despite having multiple bowel movements yesterday. EXAM: ABDOMEN - 1 VIEW COMPARISON:  Abdominal and pelvic CT scan of August 16, 2017 FINDINGS: The colonic stool burden remains increased. There is no evidence of a small bowel  obstruction or fecal impaction. No abnormal soft tissue calcifications are observed. The bony structures are unremarkable. IMPRESSION: Increased colonic stool burden compatible with constipation. No evidence of obstruction or fecal impaction. Electronically Signed   By: David  [place] M.D.   On: 08/17/2017 08:50   Mr Lumbar Spine W Wo Contrast  Result Date: 08/16/2017 CLINICAL DATA:  Abdominal and severe back pain. Constipation. History of intravenous drug abuse and endocarditis. CT findings suspicious for L3-4 discitis. EXAM: MRI LUMBAR SPINE WITHOUT AND WITH CONTRAST TECHNIQUE: Multiplanar and multiecho pulse sequences of the lumbar spine were obtained without and with intravenous contrast. CONTRAST:  <See Chart> MULTIHANCE GADOBENATE DIMEGLUMINE 529 MG/ML IV SOLN COMPARISON:  Abdominopelvic CT 08/16/2017. FINDINGS: Segmentation: Conventional anatomy assumed, with the last open disc space designated L5-S1. Alignment:  Normal. Vertebrae: There is diffuse endplate edema and enhancement at L3-4 consistent with osteomyelitis. The intervening disc does not demonstrate significant T2 hyperintensity or abnormal enhancement. Paraspinal findings are described below. No other significant osseous findings. The visualized sacroiliac joints appear unremarkable. Conus medullaris: Extends to the upper L1 level and appears normal. There is no abnormal intradural enhancement. There is epidural enhancement at L3-4, further described below. Paraspinal and other soft tissues: There is extensive paraspinal inflammation surrounding the L3 and L4 vertebral bodies with fairly homogeneous enhancement following contrast. There is asymmetric enhancement extending into the left L3-4 foramen. There is a small fluid collection inferiorly in the left L3-4 foramen which may contribute to left L3 nerve root encroachment. No other focal fluid collections are seen. There is anterior epidural enhancement at the L3 and L4 levels. Enhancement  extends into the psoas musculature bilaterally. Probable slow flow in the iliac veins without definite DVT. Probable reactive retroperitoneal lymph nodes. Disc levels: No significant disc space findings from T11-12 through L2-3. L3-4: As above, of atypical findings of discitis, osteomyelitis and extensive paraspinal inflammation. There is anterior epidural enhancement and a small epidural abscess inferiorly in the left foramen, likely encroaching on the left L3 nerve root. These factors contribute to mass effect on the thecal sac and asymmetric narrowing of the left lateral recess. Marrow edema extends into the left L4 pedicle. No definite facet joint infection demonstrated. L4-5: Mild disc bulging, facet and ligamentous hypertrophy. No disc herniation, spinal stenosis or nerve root encroachment. L5-S1: There is disc bulging  with a broad-based left foraminal disc protrusion. This may contribute to left L5 nerve root encroachment in the canal. Mild bilateral facet hypertrophy. IMPRESSION: 1. MRI confirms the impression of atypical discitis at L3-4 with associated osteomyelitis and extensive paraspinal inflammation. There is a small epidural abscess within the left foramen and diffuse anterior epidural enhancement. 2. No other foci of infection demonstrated. 3. Disc bulging, facet ligamentous hypertrophy at L4-5 without resulting spinal stenosis or nerve root encroachment. 4. Broad-based left foraminal disc protrusion at L5-S1 may contribute to left L5 nerve root encroachment. Electronically Signed   By: Carey Bullocks M.D.   On: 08/16/2017 10:53   Ct Abdomen Pelvis W Contrast  Result Date: 08/16/2017 CLINICAL DATA:  Abdominal and severe low back pain. Abdominal distention. No bowel movement for 2 weeks. Attempted cells enema today by a lotion bottle. EXAM: CT ABDOMEN AND PELVIS WITH CONTRAST TECHNIQUE: Multidetector CT imaging of the abdomen and pelvis was performed using the standard protocol following bolus  administration of intravenous contrast. CONTRAST:  ISOVUE-300 IOPAMIDOL (ISOVUE-300) INJECTION 61% COMPARISON:  None. FINDINGS: Lower chest: The lung bases are clear. Hepatobiliary: No focal liver abnormality is seen. No gallstones, gallbladder wall thickening, or biliary dilatation. Pancreas: No ductal dilatation or inflammation. Spleen: Normal in size without focal abnormality. Adrenals/Urinary Tract: Normal adrenal glands. Mild motion artifact through the kidneys. Homogeneous enhancement with symmetric excretion on delayed phase imaging. Stomach/Bowel: Stomach is nondistended. No small bowel inflammation or obstruction. Fecalization of distal small bowel contents. Moderate large volume of stool throughout the entire colon, the distal most sigmoid colon is decompressed. No colonic wall thickening. No obstructing mass. Normal appendix. Vascular/Lymphatic: Aortic atherosclerosis without aneurysm. There are small retroperitoneal lymph nodes, for example aortocaval node measures 9 mm image 35 series 2. Additional small retroperitoneal nodes. Reproductive: Prostate is unremarkable. Other: Retroperitoneal edema and soft tissue stranding, most prominent in the region of L3-L4. No free air, free fluid, or intra-abdominal fluid collection. Musculoskeletal: Abnormal soft tissue density in the region of L3-L4 disc with adjacent retroperitoneal edema. Semi density extends into bilateral psoas muscles without dominant fluid collection. Question of abnormal epidural density L3-L4 level. There are early endplate changes involving superior L4 and inferior L3 suspicious for osteomyelitis. IMPRESSION: 1. Findings suspicious for L3-L4 diskitis osteomyelitis with possible epidural component. Recommend lumbar spine MRI, with contrast if no contraindications. 2. Large colonic stool burden and fecalization of distal small bowel contents consistent with constipation and slow transit. 3.  Aortic Atherosclerosis (ICD10-I70.0). These  results were called by telephone at the time of interpretation on 08/16/2017 at 12:38 am to Dr. Lynden Oxford , who verbally acknowledged these results. Electronically Signed   By: Rubye Oaks M.D.   On: 08/16/2017 00:39   Dg Knee Complete 4 Views Left  Result Date: 07/28/2017 CLINICAL DATA:  Acute onset of left knee pain after falling off ladder 1 week ago. Initial encounter. EXAM: LEFT KNEE - COMPLETE 4+ VIEW COMPARISON:  None. FINDINGS: There is no evidence of fracture or dislocation. The joint spaces are preserved. No significant degenerative change is seen; the patellofemoral joint is grossly unremarkable in appearance. No significant joint effusion is seen. The visualized soft tissues are normal in appearance. IMPRESSION: No evidence of fracture or dislocation Electronically Signed   By: Roanna Raider M.D.   On: 07/28/2017 21:26   Dg Hip Unilat With Pelvis 2-3 Views Left  Result Date: 07/28/2017 CLINICAL DATA:  Acute onset of left hip pain after falling off ladder. Initial encounter. EXAM:  DG HIP (WITH OR WITHOUT PELVIS) 2-3V LEFT COMPARISON:  None. FINDINGS: There is no evidence of fracture or dislocation. Both femoral heads are seated normally within their respective acetabula. The proximal left femur appears intact. No significant degenerative change is appreciated. The sacroiliac joints are unremarkable in appearance. The visualized bowel gas pattern is grossly unremarkable in appearance. IMPRESSION: No evidence of fracture or dislocation. Electronically Signed   By: Roanna Raider M.D.   On: 07/28/2017 21:25   Ir Lumbar Disc Aspiration W/img Guide  Result Date: 08/18/2017 INDICATION: Severe low back pain.  Discitis osteomyelitis at L3-L4. EXAM: FLUOROSCOPIC GUIDED DISC ASPIRATION MEDICATIONS: The patient is currently admitted to the hospital and receiving intravenous antibiotics. The antibiotics were administered within an appropriate time frame prior to the initiation of the  procedure. ANESTHESIA/SEDATION: Fentanyl 2 mcg IV; Versed 50 mg IV.  Dilaudid 3 mg IV. Moderate Sedation Time:  20 minutes. The patient was continuously monitored during the procedure by the interventional radiology nurse under my direct supervision. COMPLICATIONS: None immediate. PROCEDURE: Informed written consent was obtained from the patient after a thorough discussion of the procedural risks, benefits and alternatives. All questions were addressed. Maximal Sterile Barrier Technique was utilized including caps, mask, sterile gowns, sterile gloves, sterile drape, hand hygiene and skin antiseptic. A timeout was performed prior to the initiation of the procedure. The patient was laid prone on the fluoroscopic table. The skin overlying the lumbar region was then prepped and draped in the usual sterile fashion. The right L3-L4 disc space was then identified in a posterolateral view. The skin overlying this was then infiltrated with 0.25% bupivacaine. Using biplane intermittent fluoroscopy, a 21 gauge Franseen biopsy needle was then advanced into the L3-L4 disc space. Using a 20 mL syringe, aspiration was then performed. A second pass was made with a new sterile Franseen needle into a different location at L3-L4. Again using a 20 mL syringe, aspiration was obtained. The total amount of aspirate was minimal. This was sent for microbiological analysis, however. Hemostasis was achieved at the skin entry site. The patient tolerated the procedure well. IMPRESSION: Status post fluoroscopic guided disc aspiration at L3-L4 with 2 passes with a 21 gauge Franseen needle. Electronically Signed   By: Julieanne Cotton M.D.   On: 08/17/2017 18:36    Time Spent in minutes  25   Eddie North M.D on 08/18/2017 at 11:43 AM  Between 7am to 7pm - Pager - [phone]  After 7pm go to [email or web address] - password [facility]  [facility] -  Office  [phone]

## 2017-08-18 NOTE — Progress Notes (Signed)
Patient ID: Jesus Watkins, male   DOB: [birth year], 46 y.o.   MRN: [id number]          [facility]  Date of Admission:  08/15/2017     ASSESSMENT: His lumbar aspirate cultures are growing staph aureus. I have restarted IV vancomycin pending antibiotic susceptibility results. He is very motivated to get well. He is willing to go to a skilled nursing facility to complete antibiotic therapylike he did in January.  PLAN: 1. PICC placement 2. Continue vancomycin pending final culture results 3. 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 enoxaparin (LOVENOX) injection  40 mg Subcutaneous Q24H  . insulin aspart  0-9 Units Subcutaneous TID WC  . metoCLOPramide (REGLAN) injection  10 mg Intravenous Q6H  . oxyCODONE  10 mg Oral Q12H    SUBJECTIVE: He is having severe low back pain. He was not able to sleep last night.  Review of Systems: Review of Systems  Constitutional: Negative for chills, diaphoresis and fever.  Respiratory: Negative for cough.   Cardiovascular: Negative for chest pain.  Gastrointestinal: Negative for abdominal pain, diarrhea, nausea and vomiting.  Musculoskeletal: Positive for back pain. Negative for myalgias.  Skin: Negative for rash.  Neurological: Negative for headaches.  Psychiatric/Behavioral: Positive for substance abuse.    No Known Allergies  OBJECTIVE: Vitals:   08/17/17 1623 08/17/17 1628 08/17/17 2209 08/18/17 0452  BP: 130/78 126/79 115/65 (!) 124/55  Pulse: 89 86 85 75  Resp: 12 12  18   Temp:   98.8 F (37.1 C) 99 F (37.2 C)  TempSrc:   Oral Oral  SpO2: 99% 100% 99% 100%  Weight:      Height:       Body mass index is 28.19 kg/m.  Physical  Exam  Constitutional: He is oriented to person, place, and time.  He is uncomfortable due to pain.  Cardiovascular: Normal rate and regular rhythm.   No murmur heard. Pulmonary/Chest: Effort normal and breath sounds normal. He has no wheezes. He has no rales.  Abdominal: Soft. There is no tenderness.  Neurological: He is alert and oriented to person, place, and time.  Skin: No rash noted.  Psychiatric: Mood and affect normal.    Lab Results Lab Results  Component Value Date   WBC 9.2 08/18/2017   HGB 11.2 (L) 08/18/2017   HCT 33.7 (L) 08/18/2017   MCV 84.0 08/18/2017   PLT 372 08/18/2017    Lab Results  Component Value Date   CREATININE 0.75 08/18/2017   BUN 13 08/18/2017   NA 132 (L) 08/18/2017   K 4.3 08/18/2017   CL 99 (L) 08/18/2017   CO2 23 08/18/2017    Lab Results  Component Value Date   ALT 90 (H) 08/18/2017   AST 77 (H) 08/18/2017   ALKPHOS 190 (H) 08/18/2017   BILITOT 0.7 08/18/2017     Microbiology: Recent Results (from the past 240 hour(s))  Blood Culture (routine x 2)     Status: None (Preliminary result)   Collection Time: 08/16/17  1:35 AM  Result Value Ref Range Status   Specimen Description BLOOD RIGHT ARM  Final   Special Requests   Final  BOTTLES DRAWN AEROBIC AND ANAEROBIC Blood Culture results may not be optimal due to an excessive volume of blood received in culture bottles   Culture   Final    NO GROWTH 2 DAYS Performed at [facility], [address], [place], [place] [address]    Report Status PENDING  Incomplete  Blood Culture (routine x 2)     Status: None (Preliminary result)   Collection Time: 08/16/17  1:40 AM  Result Value Ref Range Status   Specimen Description BLOOD RIGHT ARM  Final   Special Requests IN PEDIATRIC BOTTLE Blood Culture adequate volume  Final   Culture   Final    NO GROWTH 2 DAYS Performed at [facility], [address], [place], [place] [address]    Report Status PENDING  Incomplete  MRSA PCR  Screening     Status: None   Collection Time: 08/16/17 12:09 PM  Result Value Ref Range Status   MRSA by PCR NEGATIVE NEGATIVE Final    Comment:        The GeneXpert MRSA Assay (FDA approved for NASAL specimens only), is one component of a comprehensive MRSA colonization surveillance program. It is not intended to diagnose MRSA infection nor to guide or monitor treatment for MRSA infections.   Body fluid culture     Status: None (Preliminary result)   Collection Time: 08/17/17  5:06 PM  Result Value Ref Range Status   Specimen Description FLUID  Final   Special Requests DISC L3 L4  Final   Gram Stain   Final    RARE WBC PRESENT,BOTH PMN AND MONONUCLEAR NO ORGANISMS SEEN    Culture   Final    MODERATE STAPHYLOCOCCUS AUREUS SUSCEPTIBILITIES TO FOLLOW CRITICAL RESULT CALLED TO, READ BACK BY AND VERIFIED WITH: H. MACINTOSH RN, AT 1041 08/18/17 BY D. VANHOOK REGARDING CULTURE GROWTH    Report Status PENDING  Incomplete  Culture, fungus without smear     Status: None (Preliminary result)   Collection Time: 08/17/17  5:06 PM  Result Value Ref Range Status   Specimen Description FLUID  Final   Special Requests DISC L3 L4  Final   Culture NO FUNGUS ISOLATED AFTER 1 DAY  Final   Report Status PENDING  Incomplete    Cliffton Asters, MD [facility] [phone] pager   [phone] cell 08/18/2017, 1:23 PM

## 2017-08-19 DIAGNOSIS — R1084 Generalized abdominal pain: Secondary | ICD-10-CM

## 2017-08-19 DIAGNOSIS — R768 Other specified abnormal immunological findings in serum: Secondary | ICD-10-CM

## 2017-08-19 DIAGNOSIS — D72829 Elevated white blood cell count, unspecified: Secondary | ICD-10-CM

## 2017-08-19 DIAGNOSIS — K5900 Constipation, unspecified: Secondary | ICD-10-CM

## 2017-08-19 DIAGNOSIS — M4640 Discitis, unspecified, site unspecified: Secondary | ICD-10-CM

## 2017-08-19 DIAGNOSIS — G061 Intraspinal abscess and granuloma: Secondary | ICD-10-CM

## 2017-08-19 DIAGNOSIS — R739 Hyperglycemia, unspecified: Secondary | ICD-10-CM

## 2017-08-19 DIAGNOSIS — M545 Low back pain, unspecified: Secondary | ICD-10-CM

## 2017-08-19 DIAGNOSIS — E871 Hypo-osmolality and hyponatremia: Secondary | ICD-10-CM

## 2017-08-19 DIAGNOSIS — R945 Abnormal results of liver function studies: Secondary | ICD-10-CM

## 2017-08-19 DIAGNOSIS — I700 Atherosclerosis of aorta: Secondary | ICD-10-CM

## 2017-08-19 LAB — BODY FLUID CULTURE

## 2017-08-19 LAB — GLUCOSE, CAPILLARY
GLUCOSE-CAPILLARY: 158 mg/dL — AB (ref 65–99)
GLUCOSE-CAPILLARY: 87 mg/dL (ref 65–99)
GLUCOSE-CAPILLARY: 89 mg/dL (ref 65–99)
Glucose-Capillary: 139 mg/dL — ABNORMAL HIGH (ref 65–99)

## 2017-08-19 LAB — HEPATITIS C ANTIBODY: HCV Ab: 10.5 s/co ratio — ABNORMAL HIGH (ref 0.0–0.9)

## 2017-08-19 MED ORDER — ACETAMINOPHEN 325 MG PO TABS
650.0000 mg | ORAL_TABLET | ORAL | Status: DC | PRN
  Administered 2017-08-19 – 2017-08-22 (×2): 650 mg via ORAL
  Filled 2017-08-19 (×2): qty 2

## 2017-08-19 MED ORDER — CEFAZOLIN SODIUM-DEXTROSE 2-4 GM/100ML-% IV SOLN
2.0000 g | Freq: Three times a day (TID) | INTRAVENOUS | Status: DC
  Administered 2017-08-19 – 2017-08-22 (×9): 2 g via INTRAVENOUS
  Filled 2017-08-19 (×11): qty 100

## 2017-08-19 MED ORDER — SODIUM CHLORIDE 0.9% FLUSH
10.0000 mL | INTRAVENOUS | Status: DC | PRN
  Administered 2017-08-22: 10 mL
  Filled 2017-08-19: qty 40

## 2017-08-19 MED ORDER — METOCLOPRAMIDE HCL 10 MG PO TABS: 10.0000 mg | ORAL_TABLET | Freq: Three times a day (TID) | ORAL | Status: DC | PRN

## 2017-08-19 NOTE — Progress Notes (Signed)
PROGRESS NOTE                                                                                                                                                                                                             Patient Demographics:    Jesus Watkins, is a 46 y.o. male, DOB - 1971/02/17, WUJ:[id number]  Admit date - 08/15/2017   Admitting Physician Briscoe Deutscher, MD  Outpatient Primary MD for the patient is Placey, Chales Abrahams, NP  LOS - 3  Outpatient Specialists:None  Chief Complaint  Patient presents with  . 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 He was discharged on IV vancomycin for 6 weeks duration. His back pain symptoms have worsened over the past 2 weeks and reports that he had fever of 101F at that rehabilitation. Vitals in the ED was normal. Blood work showed mild leukocytosis and transaminitis. Also had elevated lactic acid. MRI of the lumbar spine shows atypical discitis with osteomyelitis of the level of L3-L4 and extensive paraspinal inflammation. Small epidural abscess within the left foramen and diffuse anterior epidural enhancement noted. Patient admitted to hospitalist service.   Subjective:   Low back pain persistent, patient is willing to go to Rehab for full treatment.     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 No neurological deficit, bowel or urinary symptoms. Pain  control with OxyIR and ER (stable on reduced dose). Avoid IV narcotics. 2-D echo done to rule out vegetation. ID has recommended IV cefazolin thru 09/29/17  Lumbar disc aspirated by IR on 8/27, preliminary growing moderate staph aureus. Culture sent for fungus and AFB.Marland Kitchen Antibiotic recommendations per ID.  Transaminitis Improving in a.m. labs. Hep C antibody positive and viral load ordered by ID, Dr. Orvan Falconer will follow him outpatient to discuss treatment.   IV heroin use  Reports being clean for the past 3 weeks and currently in rehabilitation.  Constipation Resolved with enema. Laxatives as needed.     tobacco abuse Counseled on cessation.  prediabetes Monitor on sliding-scale coverage  Code Status : Full code  Family Communication  : None at bedside  Disposition Plan  : SNF placement, consulted  CSW  Barriers For Discharge : Active symptoms  Consults  :  IR ID  Procedures  :  CT abdomen MRI lumbar spine Lumbar Disc aspiration   DVT Prophylaxis  :  Lovenox -   Lab Results  Component Value Date   PLT 372 08/18/2017    Antibiotics  :   Anti-infectives    Start     Dose/Rate Route Frequency Ordered Stop   08/19/17 0945  ceFAZolin (ANCEF) IVPB 2g/100 mL premix     2 g 200 mL/hr over 30 Minutes Intravenous Every 8 hours 08/19/17 0936     08/18/17 1130  vancomycin (VANCOCIN) IVPB 1000 mg/200 mL premix  Status:  Discontinued     1,000 mg 200 mL/hr over 60 Minutes Intravenous Every 8 hours 08/18/17 1049 08/19/17 0936   08/16/17 1330  vancomycin (VANCOCIN) IVPB 1000 mg/200 mL premix  Status:  Discontinued     1,000 mg 200 mL/hr over 60 Minutes Intravenous Every 8 hours 08/16/17 1305 08/17/17 0759   08/16/17 1330  piperacillin-tazobactam (ZOSYN) IVPB 3.375 g  Status:  Discontinued     3.375 g 12.5 mL/hr over 240 Minutes Intravenous Every 8 hours 08/16/17 1305 08/17/17 0759   08/16/17 1230  piperacillin-tazobactam (ZOSYN) IVPB 3.375 g  Status:  Discontinued      3.375 g 100 mL/hr over 30 Minutes Intravenous Every 6 hours 08/16/17 1221 08/16/17 1255   08/16/17 1230  vancomycin (VANCOCIN) IVPB 1000 mg/200 mL premix  Status:  Discontinued     1,000 mg 200 mL/hr over 60 Minutes Intravenous Every 12 hours 08/16/17 1221 08/16/17 1255   08/16/17 0045  piperacillin-tazobactam (ZOSYN) IVPB 3.375 g     3.375 g 100 mL/hr over 30 Minutes Intravenous  Once 08/16/17 0042 08/16/17 0358   08/16/17 0045  vancomycin (VANCOCIN) IVPB 1000 mg/200 mL premix     1,000 mg 200 mL/hr over 60 Minutes Intravenous  Once 08/16/17 0042 08/16/17 0358        Objective:   Vitals:   08/18/17 1809 08/18/17 2332 08/19/17 0200 08/19/17 0534  BP: 112/66 119/62  110/60  Pulse: 76 80  75  Resp:  18  18  Temp: 99.2 F (37.3 C) (!) 100.6 F (38.1 C) 98.4 F (36.9 C) 98 F (36.7 C)  TempSrc: Oral Oral  Oral  SpO2: 99% 99%  99%  Weight:      Height:        Wt Readings from Last 3 Encounters:  08/15/17 81.6 kg (180 lb)  07/28/17 74.8 kg (165 lb)  01/20/17 74.8 kg (165 lb)     Intake/Output Summary (Last 24 hours) at 08/19/17 1254 Last data filed at 08/18/17 1800  Gross per 24 hour  Intake              760 ml  Output                0 ml  Net              760 ml   Physical Exam  Gen: not in distress awake, alert, cooperative.  HEENT:  moist mucosa, supple neck Chest: clear b/l, no added sounds  CVS: N S1&S2, no murmurs.   GI: soft, NT, ND, no HSM  Musculoskeletal: warm, Left lumbar paraspinal tenderness CNS:  no lower extremity neurological deficit    Data Review:    CBC  Recent Labs Lab 08/15/17 2257 08/16/17 1344 08/18/17 0618  WBC 13.2* 8.6 9.2  HGB 13.0 11.8* 11.2*  HCT 39.4 37.1* 33.7*  PLT 488* 445* 372  MCV 84.0 85.3 84.0  MCH 27.7 27.1 27.9  MCHC 33.0 31.8 33.2  RDW 13.9 14.1 14.1  LYMPHSABS 1.6  --   --   MONOABS 1.5*  --   --   EOSABS 0.1  --   --   BASOSABS 0.0  --   --     Chemistries   Recent Labs Lab 08/15/17 2257  08/16/17 1344 08/18/17 0618  NA 133*  --  132*  K 5.1  --  4.3  CL 95*  --  99*  CO2 27  --  23  GLUCOSE 120*  --  82  BUN 26*  --  13  CREATININE 0.79 0.84 0.75  CALCIUM 9.5  --  8.8*  AST 82*  --  77*  ALT 106*  --  90*  ALKPHOS 246*  --  190*  BILITOT 0.7  --  0.7   ------------------------------------------------------------------------------------------------------------------ No results for input(s): CHOL, HDL, LDLCALC, TRIG, CHOLHDL, LDLDIRECT in the last 72 hours.  Lab Results  Component Value Date   HGBA1C 6.4 (H) 08/16/2017   ------------------------------------------------------------------------------------------------------------------  Recent Labs  08/17/17 0339  TSH 3.900   ------------------------------------------------------------------------------------------------------------------ No results for input(s): VITAMINB12, FOLATE, FERRITIN, TIBC, IRON, RETICCTPCT in the last 72 hours.  Coagulation profile  Recent Labs Lab 08/17/17 1005  INR 1.08    No results for input(s): DDIMER in the last 72 hours.  Cardiac Enzymes No results for input(s): CKMB, TROPONINI, MYOGLOBIN in the last 168 hours.  Invalid input(s): CK ------------------------------------------------------------------------------------------------------------------ No results found for: BNP  Inpatient Medications  Scheduled Meds: . enoxaparin (LOVENOX) injection  40 mg Subcutaneous Q24H  . insulin aspart  0-9 Units Subcutaneous TID WC  . metoCLOPramide (REGLAN) injection  10 mg Intravenous Q6H  . oxyCODONE  10 mg Oral Q12H   Continuous Infusions: .  ceFAZolin (ANCEF) IV Stopped (08/19/17 1123)   PRN Meds:.acetaminophen, ketorolac, ondansetron **OR** ondansetron (ZOFRAN) IV, oxyCODONE, sorbitol  Micro Results Recent Results (from the past 240 hour(s))  Blood Culture (routine x 2)     Status: None (Preliminary result)   Collection Time: 08/16/17  1:35 AM  Result Value Ref  Range Status   Specimen Description BLOOD RIGHT ARM  Final   Special Requests   Final    BOTTLES DRAWN AEROBIC AND ANAEROBIC Blood Culture results may not be optimal due to an excessive volume of blood received in culture bottles   Culture   Final    NO GROWTH 3 DAYS Performed at [facility], [address] [place], [place] [address]    Report Status PENDING  Incomplete  Blood Culture (routine x 2)     Status: None (Preliminary result)   Collection Time: 08/16/17  1:40 AM  Result Value Ref Range Status   Specimen Description BLOOD RIGHT ARM  Final   Special Requests IN PEDIATRIC BOTTLE Blood Culture adequate volume  Final   Culture   Final    NO GROWTH 3 DAYS Performed at [facility], [address], [place], [place] [address]    Report Status PENDING  Incomplete  MRSA PCR  Screening     Status: None   Collection Time: 08/16/17 12:09 PM  Result Value Ref Range Status   MRSA by PCR NEGATIVE NEGATIVE Final    Comment:        The GeneXpert MRSA Assay (FDA approved for NASAL specimens only), is one component of a comprehensive MRSA colonization surveillance program. It is not intended to diagnose MRSA infection nor to guide or monitor treatment for MRSA infections.   Body fluid culture     Status: None (Preliminary result)   Collection Time: 08/17/17  5:06 PM  Result Value Ref Range Status   Specimen Description FLUID  Final   Special Requests DISC L3 L4  Final   Gram Stain   Final    RARE WBC PRESENT,BOTH PMN AND MONONUCLEAR NO ORGANISMS SEEN    Culture   Final    MODERATE STAPHYLOCOCCUS AUREUS CRITICAL RESULT CALLED TO, READ BACK BY AND VERIFIED WITH: H. MACINTOSH RN, AT 1041 08/18/17 BY D. VANHOOK REGARDING CULTURE GROWTH    Report Status PENDING  Incomplete   Organism ID, Bacteria STAPHYLOCOCCUS AUREUS  Final      Susceptibility   Staphylococcus aureus - MIC*    CIPROFLOXACIN <=0.5 SENSITIVE Sensitive     ERYTHROMYCIN >=8 RESISTANT Resistant      GENTAMICIN <=0.5 SENSITIVE Sensitive     OXACILLIN 0.5 SENSITIVE Sensitive     TETRACYCLINE <=1 SENSITIVE Sensitive     VANCOMYCIN <=0.5 SENSITIVE Sensitive     TRIMETH/SULFA <=10 SENSITIVE Sensitive     CLINDAMYCIN <=0.25 SENSITIVE Sensitive     RIFAMPIN <=0.5 SENSITIVE Sensitive     Inducible Clindamycin NEGATIVE Sensitive     * MODERATE STAPHYLOCOCCUS AUREUS  Culture, fungus without smear     Status: None (Preliminary result)   Collection Time: 08/17/17  5:06 PM  Result Value Ref Range Status   Specimen Description FLUID  Final   Special Requests DISC L3 L4  Final   Culture NO FUNGUS ISOLATED AFTER 2 DAYS  Final   Report Status PENDING  Incomplete  Acid Fast Smear (AFB)     Status: None   Collection Time: 08/17/17  5:06 PM  Result Value Ref Range Status   AFB Specimen Processing Concentration  Final   Acid Fast Smear Negative  Final    Comment: (NOTE) Performed At: [facility] [address] [place], [place] [id number] Mila Homer MD [id number]    Source (AFB) FLUID  Final    Comment: DISC L3 L4    Radiology Reports Abd 1 View (kub)  Result Date: 08/17/2017 CLINICAL DATA:  Back pain, constipation despite having multiple bowel movements yesterday. EXAM: ABDOMEN - 1 VIEW COMPARISON:  Abdominal and pelvic CT scan of August 16, 2017 FINDINGS: The colonic stool burden remains increased. There is no evidence of a small bowel obstruction or fecal impaction. No abnormal soft tissue calcifications are observed. The bony structures are unremarkable. IMPRESSION: Increased colonic stool burden compatible with constipation. No evidence of obstruction or fecal impaction. Electronically Signed   By: David  [place]az[place]and M.D.   On: 08/17/2017 08:50   Mr Lumbar Spine W Wo Contrast  Result Date: 08/16/2017 CLINICAL DATA:  Abdominal and severe back pain. Constipation. History of intravenous drug abuse and endocarditis. CT findings suspicious for L3-4 discitis. EXAM: MRI LUMBAR SPINE  WITHOUT AND WITH CONTRAST TECHNIQUE: Multiplanar and multiecho pulse sequences of the lumbar spine were obtained without and with intravenous contrast. CONTRAST:  <See Chart> MULTIHANCE GADOBENATE DIMEGLUMINE 529 MG/ML IV SOLN  COMPARISON:  Abdominopelvic CT 08/16/2017. FINDINGS: Segmentation: Conventional anatomy assumed, with the last open disc space designated L5-S1. Alignment:  Normal. Vertebrae: There is diffuse endplate edema and enhancement at L3-4 consistent with osteomyelitis. The intervening disc does not demonstrate significant T2 hyperintensity or abnormal enhancement. Paraspinal findings are described below. No other significant osseous findings. The visualized sacroiliac joints appear unremarkable. Conus medullaris: Extends to the upper L1 level and appears normal. There is no abnormal intradural enhancement. There is epidural enhancement at L3-4, further described below. Paraspinal and other soft tissues: There is extensive paraspinal inflammation surrounding the L3 and L4 vertebral bodies with fairly homogeneous enhancement following contrast. There is asymmetric enhancement extending into the left L3-4 foramen. There is a small fluid collection inferiorly in the left L3-4 foramen which may contribute to left L3 nerve root encroachment. No other focal fluid collections are seen. There is anterior epidural enhancement at the L3 and L4 levels. Enhancement extends into the psoas musculature bilaterally. Probable slow flow in the iliac veins without definite DVT. Probable reactive retroperitoneal lymph nodes. Disc levels: No significant disc space findings from T11-12 through L2-3. L3-4: As above, of atypical findings of discitis, osteomyelitis and extensive paraspinal inflammation. There is anterior epidural enhancement and a small epidural abscess inferiorly in the left foramen, likely encroaching on the left L3 nerve root. These factors contribute to mass effect on the thecal sac and asymmetric  narrowing of the left lateral recess. Marrow edema extends into the left L4 pedicle. No definite facet joint infection demonstrated. L4-5: Mild disc bulging, facet and ligamentous hypertrophy. No disc herniation, spinal stenosis or nerve root encroachment. L5-S1: There is disc bulging with a broad-based left foraminal disc protrusion. This may contribute to left L5 nerve root encroachment in the canal. Mild bilateral facet hypertrophy. IMPRESSION: 1. MRI confirms the impression of atypical discitis at L3-4 with associated osteomyelitis and extensive paraspinal inflammation. There is a small epidural abscess within the left foramen and diffuse anterior epidural enhancement. 2. No other foci of infection demonstrated. 3. Disc bulging, facet ligamentous hypertrophy at L4-5 without resulting spinal stenosis or nerve root encroachment. 4. Broad-based left foraminal disc protrusion at L5-S1 may contribute to left L5 nerve root encroachment. Electronically Signed   By: Carey Bullocks M.D.   On: 08/16/2017 10:53   Ct Abdomen Pelvis W Contrast  Result Date: 08/16/2017 CLINICAL DATA:  Abdominal and severe low back pain. Abdominal distention. No bowel movement for 2 weeks. Attempted cells enema today by a lotion bottle. EXAM: CT ABDOMEN AND PELVIS WITH CONTRAST TECHNIQUE: Multidetector CT imaging of the abdomen and pelvis was performed using the standard protocol following bolus administration of intravenous contrast. CONTRAST:  ISOVUE-300 IOPAMIDOL (ISOVUE-300) INJECTION 61% COMPARISON:  None. FINDINGS: Lower chest: The lung bases are clear. Hepatobiliary: No focal liver abnormality is seen. No gallstones, gallbladder wall thickening, or biliary dilatation. Pancreas: No ductal dilatation or inflammation. Spleen: Normal in size without focal abnormality. Adrenals/Urinary Tract: Normal adrenal glands. Mild motion artifact through the kidneys. Homogeneous enhancement with symmetric excretion on delayed phase imaging.  Stomach/Bowel: Stomach is nondistended. No small bowel inflammation or obstruction. Fecalization of distal small bowel contents. Moderate large volume of stool throughout the entire colon, the distal most sigmoid colon is decompressed. No colonic wall thickening. No obstructing mass. Normal appendix. Vascular/Lymphatic: Aortic atherosclerosis without aneurysm. There are small retroperitoneal lymph nodes, for example aortocaval node measures 9 mm image 35 series 2. Additional small retroperitoneal nodes. Reproductive: Prostate is unremarkable. Other: Retroperitoneal edema and  soft tissue stranding, most prominent in the region of L3-L4. No free air, free fluid, or intra-abdominal fluid collection. Musculoskeletal: Abnormal soft tissue density in the region of L3-L4 disc with adjacent retroperitoneal edema. Semi density extends into bilateral psoas muscles without dominant fluid collection. Question of abnormal epidural density L3-L4 level. There are early endplate changes involving superior L4 and inferior L3 suspicious for osteomyelitis. IMPRESSION: 1. Findings suspicious for L3-L4 diskitis osteomyelitis with possible epidural component. Recommend lumbar spine MRI, with contrast if no contraindications. 2. Large colonic stool burden and fecalization of distal small bowel contents consistent with constipation and slow transit. 3.  Aortic Atherosclerosis (ICD10-I70.0). These results were called by telephone at the time of interpretation on 08/16/2017 at 12:38 am to Dr. Lynden OxfordHRISTOPHER TEGELER , who verbally acknowledged these results. Electronically Signed   By: Rubye OaksMelanie  Ehinger M.D.   On: 08/16/2017 00:39   Dg Knee Complete 4 Views Left  Result Date: 07/28/2017 CLINICAL DATA:  Acute onset of left knee pain after falling off ladder 1 week ago. Initial encounter. EXAM: LEFT KNEE - COMPLETE 4+ VIEW COMPARISON:  None. FINDINGS: There is no evidence of fracture or dislocation. The joint spaces are preserved. No significant  degenerative change is seen; the patellofemoral joint is grossly unremarkable in appearance. No significant joint effusion is seen. The visualized soft tissues are normal in appearance. IMPRESSION: No evidence of fracture or dislocation Electronically Signed   By: Roanna RaiderJeffery  Chang M.D.   On: 07/28/2017 21:26   Dg Hip Unilat With Pelvis 2-3 Views Left  Result Date: 07/28/2017 CLINICAL DATA:  Acute onset of left hip pain after falling off ladder. Initial encounter. EXAM: DG HIP (WITH OR WITHOUT PELVIS) 2-3V LEFT COMPARISON:  None. FINDINGS: There is no evidence of fracture or dislocation. Both femoral heads are seated normally within their respective acetabula. The proximal left femur appears intact. No significant degenerative change is appreciated. The sacroiliac joints are unremarkable in appearance. The visualized bowel gas pattern is grossly unremarkable in appearance. IMPRESSION: No evidence of fracture or dislocation. Electronically Signed   By: Roanna RaiderJeffery  Chang M.D.   On: 07/28/2017 21:25   Ir Lumbar Disc Aspiration W/img Guide  Result Date: 08/18/2017 INDICATION: Severe low back pain.  Discitis osteomyelitis at L3-L4. EXAM: FLUOROSCOPIC GUIDED DISC ASPIRATION MEDICATIONS: The patient is currently admitted to the hospital and receiving intravenous antibiotics. The antibiotics were administered within an appropriate time frame prior to the initiation of the procedure. ANESTHESIA/SEDATION: Fentanyl 2 mcg IV; Versed 50 mg IV.  Dilaudid 3 mg IV. Moderate Sedation Time:  20 minutes. The patient was continuously monitored during the procedure by the interventional radiology nurse under my direct supervision. COMPLICATIONS: None immediate. PROCEDURE: Informed written consent was obtained from the patient after a thorough discussion of the procedural risks, benefits and alternatives. All questions were addressed. Maximal Sterile Barrier Technique was utilized including caps, mask, sterile gowns, sterile gloves,  sterile drape, hand hygiene and skin antiseptic. A timeout was performed prior to the initiation of the procedure. The patient was laid prone on the fluoroscopic table. The skin overlying the lumbar region was then prepped and draped in the usual sterile fashion. The right L3-L4 disc space was then identified in a posterolateral view. The skin overlying this was then infiltrated with 0.25% bupivacaine. Using biplane intermittent fluoroscopy, a 21 gauge Franseen biopsy needle was then advanced into the L3-L4 disc space. Using a 20 mL syringe, aspiration was then performed. A second pass was made with a new sterile Anabel BeneFranseen  needle into a different location at L3-L4. Again using a 20 mL syringe, aspiration was obtained. The total amount of aspirate was minimal. This was sent for microbiological analysis, however. Hemostasis was achieved at the skin entry site. The patient tolerated the procedure well. IMPRESSION: Status post fluoroscopic guided disc aspiration at L3-L4 with 2 passes with a 21 gauge Franseen needle. Electronically Signed   By: Julieanne Cotton M.D.   On: 08/17/2017 18:36   Time Spent in minutes  25  Clanford Johnson M.D on 08/19/2017 at 12:54 PM  Between 7am to 7pm - Pager - [phone]  After 7pm go to [email or web address] - password [facility]  [facility] -  Office  [phone]

## 2017-08-19 NOTE — Progress Notes (Signed)
Peripherally Inserted Central Catheter/Midline Placement  The IV Nurse has discussed with the patient and/or persons authorized to consent for the patient, the purpose of this procedure and the potential benefits and risks involved with this procedure.  The benefits include less needle sticks, lab draws from the catheter, and the patient may be discharged home with the catheter. Risks include, but not limited to, infection, bleeding, blood clot (thrombus formation), and puncture of an artery; nerve damage and irregular heartbeat and possibility to perform a PICC exchange if needed/ordered by physician.  Alternatives to this procedure were also discussed.  Bard Power PICC patient education guide, fact sheet on infection prevention and patient information card has been provided to patient /or left at bedside.    PICC/Midline Placement Documentation  PICC Single Lumen 08/19/17 PICC Left Basilic 42 cm 0 cm (Active)  Indication for Insertion or Continuance of Line Home intravenous therapies (PICC only) 08/19/2017  4:00 PM  Exposed Catheter (cm) 0 cm 08/19/2017  4:00 PM  Dressing Change Due 08/26/17 08/19/2017  4:00 PM       Stacie GlazeJoyce, Andersson Larrabee Horton 08/19/2017, 4:15 PM

## 2017-08-19 NOTE — Progress Notes (Signed)
Patient ID: Jesus Watkins, male   DOB: 11/08/1971, 46 y.o.   MRN: [id number]          [facility] for Infectious Disease  Date of Admission:  08/15/2017      Day 2 vancomycin  ASSESSMENT: His lumbar aspirate cultures have grown MSSA. I will treat him with IV cefazolin for 6 weeks. He is waiting PICC placement.  Since his hospitalization in January he has developed hepatitis C antibody. He tells me that he never shares needles when injecting drugs but he has shared spoons to mix his drugs. I have ordered a hepatitis C viral load. He will be following up with me in clinic where we can review this and determine if he needs treatment for active hepatitis C. Fortunately he is HIV antibody negative.  PLAN: 1. PICC placement 2. Change vancomycin to cefazolin 3. Check a hepatitis C viral load  Diagnosis: Lumbar discitis  Culture Result: MSSA  No Known Allergies  OPAT Orders Discharge antibiotics: Per pharmacy protocol cefazolin  Duration: 6 weeks End Date: 09/29/2017  [facility] Care Per Protocol:  Labs weekly while on IV antibiotics: _x_ CBC with differential _x_ BMP __ CMP _x_ CRP _x_ ESR __ Vancomycin trough  __ Please pull PIC at completion of IV antibiotics _x_ Please leave PIC in place until doctor has seen patient or been notified  Fax weekly labs to [phone]  Clinic Follow Up Appt: I will arrange follow-up in my clinic in early October  Principal Problem:   Discitis of lumbar region Active Problems:   Hyperglycemia   Leukocytosis   Homeless   Heroin abuse   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 enoxaparin (LOVENOX) injection  40 mg Subcutaneous Q24H  . insulin aspart  0-9 Units Subcutaneous TID WC  . metoCLOPramide (REGLAN) injection  10 mg Intravenous Q6H  . oxyCODONE  10 mg Oral Q12H    SUBJECTIVE: His back  pain is under much better control with Toradol. He was able to get some sleep last night. He is still agreeable with skilled nursing facility placement to complete 6 weeks of IV antibiotic therapy.  Review of Systems: Review of Systems  Constitutional: Negative for chills, diaphoresis and fever.  Respiratory: Negative for cough.   Cardiovascular: Negative for chest pain.  Gastrointestinal: Negative for abdominal pain, diarrhea, nausea and vomiting.  Musculoskeletal: Positive for back pain. Negative for myalgias.  Skin: Negative for rash.  Neurological: Negative for headaches.  Psychiatric/Behavioral: Positive for substance abuse.    No Known Allergies  OBJECTIVE: Vitals:   08/18/17 1809 08/18/17 2332 08/19/17 0200 08/19/17 0534  BP: 112/66 119/62  110/60  Pulse: 76 80  75  Resp:  18  18  Temp: 99.2 F (37.3 C) (!) 100.6 F (38.1 C) 98.4 F (36.9 C) 98 F (36.7 C)  TempSrc: Oral Oral  Oral  SpO2: 99% 99%  99%  Weight:      Height:       Body mass index is 28.19 kg/m.  Physical Exam  Constitutional: He is oriented to person, place, and time.  He is smiling and in good spirits. He appears much more comfortable today.  Cardiovascular: Normal rate and regular rhythm.   No murmur heard. Pulmonary/Chest: Effort normal and breath sounds normal. He has no wheezes. He has no rales.  Abdominal: Soft. There is  no tenderness.  Neurological: He is alert and oriented to person, place, and time.  Skin: No rash noted.  Psychiatric: Mood and affect normal.    Lab Results Lab Results  Component Value Date   WBC 9.2 08/18/2017   HGB 11.2 (L) 08/18/2017   HCT 33.7 (L) 08/18/2017   MCV 84.0 08/18/2017   PLT 372 08/18/2017    Lab Results  Component Value Date   CREATININE 0.75 08/18/2017   BUN 13 08/18/2017   NA 132 (L) 08/18/2017   K 4.3 08/18/2017   CL 99 (L) 08/18/2017   CO2 23 08/18/2017    Lab Results  Component Value Date   ALT 90 (H) 08/18/2017   AST 77 (H)  08/18/2017   ALKPHOS 190 (H) 08/18/2017   BILITOT 0.7 08/18/2017     Microbiology: Recent Results (from the past 240 hour(s))  Blood Culture (routine x 2)     Status: None (Preliminary result)   Collection Time: 08/16/17  1:35 AM  Result Value Ref Range Status   Specimen Description BLOOD RIGHT ARM  Final   Special Requests   Final    BOTTLES DRAWN AEROBIC AND ANAEROBIC Blood Culture results may not be optimal due to an excessive volume of blood received in culture bottles   Culture   Final    NO GROWTH 3 DAYS Performed at [facility], [place] [address], [place], [place] [address]    Report Status PENDING  Incomplete  Blood Culture (routine x 2)     Status: None (Preliminary result)   Collection Time: 08/16/17  1:40 AM  Result Value Ref Range Status   Specimen Description BLOOD RIGHT ARM  Final   Special Requests IN PEDIATRIC BOTTLE Blood Culture adequate volume  Final   Culture   Final    NO GROWTH 3 DAYS Performed at [facility], [place] [address], [place], [place] [address]    Report Status PENDING  Incomplete  MRSA PCR Screening     Status: None   Collection Time: 08/16/17 12:09 PM  Result Value Ref Range Status   MRSA by PCR NEGATIVE NEGATIVE Final    Comment:        The GeneXpert MRSA Assay (FDA approved for NASAL specimens only), is one component of a comprehensive MRSA colonization surveillance program. It is not intended to diagnose MRSA infection nor to guide or monitor treatment for MRSA infections.   Body fluid culture     Status: None (Preliminary result)   Collection Time: 08/17/17  5:06 PM  Result Value Ref Range Status   Specimen Description FLUID  Final   Special Requests DISC L3 L4  Final   Gram Stain   Final    RARE WBC PRESENT,BOTH PMN AND MONONUCLEAR NO ORGANISMS SEEN    Culture   Final    MODERATE STAPHYLOCOCCUS AUREUS CRITICAL RESULT CALLED TO, READ BACK BY AND VERIFIED WITH: H. MACINTOSH RN, AT 1041 08/18/17 BY D. VANHOOK REGARDING  CULTURE GROWTH    Report Status PENDING  Incomplete   Organism ID, Bacteria STAPHYLOCOCCUS AUREUS  Final      Susceptibility   Staphylococcus aureus - MIC*    CIPROFLOXACIN <=0.5 SENSITIVE Sensitive     ERYTHROMYCIN >=8 RESISTANT Resistant     GENTAMICIN <=0.5 SENSITIVE Sensitive     OXACILLIN 0.5 SENSITIVE Sensitive     TETRACYCLINE <=1 SENSITIVE Sensitive     VANCOMYCIN <=0.5 SENSITIVE Sensitive     TRIMETH/SULFA <=10 SENSITIVE Sensitive  CLINDAMYCIN <=0.25 SENSITIVE Sensitive     RIFAMPIN <=0.5 SENSITIVE Sensitive     Inducible Clindamycin NEGATIVE Sensitive     * MODERATE STAPHYLOCOCCUS AUREUS  Culture, fungus without smear     Status: None (Preliminary result)   Collection Time: 08/17/17  5:06 PM  Result Value Ref Range Status   Specimen Description FLUID  Final   Special Requests DISC L3 L4  Final   Culture NO FUNGUS ISOLATED AFTER 2 DAYS  Final   Report Status PENDING  Incomplete  Acid Fast Smear (AFB)     Status: None   Collection Time: 08/17/17  5:06 PM  Result Value Ref Range Status   AFB Specimen Processing Concentration  Final   Acid Fast Smear Negative  Final    Comment: (NOTE) Performed At: [facility] [address] [place], [place] [id number] Lindon Romp MD [id number]    Source (AFB) FLUID  Final    Comment: [place] L3 L4    Michel Bickers, MD [facility] for Infectious Disease [facility] [phone] pager   [phone] cell 08/19/2017, 12:04 PM

## 2017-08-19 NOTE — Social Work (Signed)
CSW f/u with clinical supervisor for approval for LOG.  Patient has history of IV drug use, has been clean for the past 3 weeks, will need 6 weeks of Cefazolin. CSW will f/u for SNF placement.  CSW sent SNF's request to: [facility], [place], [address] of [place] and [place], [address] and [facility], [facility] [place] and [address].  CSW will f/u to see if SNf's can offer bed and will take LOG.  Keene Breath, LCSW Clinical Social Worker [phone]

## 2017-08-19 NOTE — [place] FL2 (Signed)
[place] MEDICAID FL2 LEVEL OF CARE SCREENING TOOL     IDENTIFICATION  Patient Name: Jesus Watkins Birthdate: [birth year] Sex: male Admission Date (Current Location): 08/15/2017  [facility] and [place] Number:  Producer, television/film/video and Address:  [facility], [address] [place], [place] [address]      Provider Number: [id number]  Attending Physician Name and Address:  Cleora Fleet, MD  Relative Name and Phone Number:       Current Level of Care: Hospital Recommended Level of Care: Skilled Nursing Facility Prior Approval Number:    Date Approved/Denied: 08/19/17 PASRR Number: [id number]  Discharge Plan: SNF    Current Diagnoses: Patient Active Problem List   Diagnosis Date Noted  . Hepatitis C antibody test positive 08/19/2017  . 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 Acute low back pain without sciatica   . Generalized abdominal pain   . Discitis 08/16/2017  . Constipation 08/16/2017  . Hyponatremia 08/16/2017  . Abnormal liver function tests 08/16/2017  . Aortic atherosclerosis (HCC) 08/16/2017  . Tobacco abuse 08/16/2017  . Heroin withdrawal (HCC)   . Heroin abuse   . Infarction of left temporal lobe (HCC)   . Hyperglycemia 11/05/2016  . Leukocytosis 11/05/2016  . Essential hypertension 11/05/2016  . Homeless 11/05/2016    Orientation RESPIRATION BLADDER Height & Weight     Self, Time, Situation, Place  Normal Continent Weight: 180 lb (81.6 kg) Height:  5\' 7"  (170.2 cm)  BEHAVIORAL SYMPTOMS/MOOD NEUROLOGICAL BOWEL NUTRITION STATUS      Continent Diet (See DC Summary)  AMBULATORY STATUS COMMUNICATION OF NEEDS Skin     Verbally Other (Comment) (Low Back Pain/Discitis)                       Personal Care Assistance Level of Assistance              Functional Limitations Info             SPECIAL CARE FACTORS FREQUENCY                       Contractures      Additional Factors Info   Code Status, Allergies, Insulin Sliding Scale Code Status Info: Full Code Allergies Info: No Known Allergies   Insulin Sliding Scale Info: Insulin Daily       Current Medications (08/19/2017):  This is the current hospital active medication list Current Facility-Administered Medications  Medication Dose Route Frequency Provider Last Rate Last Dose  . acetaminophen (TYLENOL) tablet 650 mg  650 mg Oral Q4H PRN Schorr, Roma Kayser, NP   650 mg at 08/19/17 0052  . ceFAZolin (ANCEF) IVPB 2g/100 mL premix  2 g Intravenous Q8H Cliffton Asters, MD   Stopped at 08/19/17 1123  . enoxaparin (LOVENOX) injection 40 mg  40 mg Subcutaneous Q24H Barnetta Chapel, PA-C   40 mg at 08/19/17 1204  . insulin aspart (novoLOG) injection 0-9 Units  0-9 Units Subcutaneous TID WC Lahoma Crocker, MD   2 Units at 08/19/17 1214  . ketorolac (TORADOL) 15 MG/ML injection 15 mg  15 mg Intravenous Q6H PRN Dhungel, Nishant, MD   15 mg at 08/19/17 1204  . metoCLOPramide (REGLAN) injection 10 mg  10 mg Intravenous Q6H Lahoma Crocker, MD   10 mg at 08/19/17 1204  . ondansetron (ZOFRAN) tablet 4 mg  4 mg Oral Q6H PRN Lahoma Crocker, MD  Or  . ondansetron (ZOFRAN) injection 4 mg  4 mg Intravenous Q6H PRN Lahoma Crocker, MD      . oxyCODONE (Oxy IR/ROXICODONE) immediate release tablet 10 mg  10 mg Oral Q4H PRN Dhungel, Nishant, MD   10 mg at 08/19/17 1204  . oxyCODONE (OXYCONTIN) 12 hr tablet 10 mg  10 mg Oral Q12H Dhungel, Nishant, MD   10 mg at 08/19/17 1053  . sorbitol 70 % solution 30 mL  30 mL Oral Daily PRN Lahoma Crocker, MD   30 mL at 08/16/17 1325     Discharge Medications: Please see discharge summary for a list of discharge medications.  Relevant Imaging Results:  Relevant Lab Results:   Additional Information SS#:[id number]  Tresa Moore, LCSW

## 2017-08-20 DIAGNOSIS — F1110 Opioid abuse, uncomplicated: Secondary | ICD-10-CM

## 2017-08-20 LAB — GLUCOSE, CAPILLARY
GLUCOSE-CAPILLARY: 120 mg/dL — AB (ref 65–99)
GLUCOSE-CAPILLARY: 93 mg/dL (ref 65–99)
GLUCOSE-CAPILLARY: 89 mg/dL (ref 65–99)
GLUCOSE-CAPILLARY: 189 mg/dL — AB (ref 65–99)

## 2017-08-20 LAB — C-REACTIVE PROTEIN: CRP: 1.9 mg/dL — ABNORMAL HIGH (ref <1.0)

## 2017-08-20 LAB — SEDIMENTATION RATE: SED RATE: 35 mm/h — AB (ref 0–16)

## 2017-08-20 NOTE — Progress Notes (Addendum)
PROGRESS NOTE                                                                                                                                                                                                      Patient Demographics:    Jesus Watkins, is a 46 y.o. male, DOB - [birth year], ZOX:[id number]  Admit date - 08/15/2017   Admitting Physician Briscoe Deutscher, MD  Outpatient Primary MD for the patient is Placey, Chales Abrahams, NP  LOS - 4  Outpatient Specialists:None  Chief Complaint  Patient presents with  . 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 He was discharged on IV vancomycin for 6 weeks duration. His back pain symptoms have worsened over the past 2 weeks and reports that he had fever of 101F at that rehabilitation. Vitals in the ED was normal. Blood work showed mild leukocytosis and transaminitis. Also had elevated lactic acid. MRI of the lumbar spine shows atypical discitis with osteomyelitis of the level of L3-L4 and extensive paraspinal inflammation. Small epidural abscess within the left foramen and diffuse anterior epidural enhancement noted. Patient admitted to hospitalist service.   Subjective:   Pt worried about pain meds in rehab, says back is still hurting.     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 No neurological deficit, bowel or urinary symptoms. Pain control with OxyIR and ER  (stable on reduced dose). Avoiding IV narcotics. 2-D echo done to rule out vegetation. ID has recommended IV cefazolin thru 09/29/17, PICC line placed 08/19/17.   Lumbar disc aspirated by IR on 8/27, preliminary growing moderate staph aureus. Culture sent for fungus and AFB.Marland Kitchen Antibiotic recommendations per ID.  Transaminitis Improving in a.m. labs. Hep C antibody positive and viral load ordered by ID, Dr. Orvan Falconer will follow him outpatient to discuss treatment.   Hepatitis C - Pt to follow up with  ID outpatient for treatment considerations.  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  Constipation Resolved with enema. Laxatives as needed.     tobacco abuse Counseled on cessation.  prediabetes Monitor on sliding-scale coverage  Code Status : Full code  Family Communication  : None at bedside  Disposition Plan  : SNF placement, consulted  CSW  Barriers For Discharge : Active symptoms  Consults  :  IR ID  Procedures  :  CT abdomen MRI lumbar spine Lumbar Disc aspiration   DVT Prophylaxis  :  Lovenox -   Lab Results  Component Value Date   PLT 372 08/18/2017    Antibiotics  :   Anti-infectives    Start     Dose/Rate Route Frequency Ordered Stop   08/19/17 0945  ceFAZolin (ANCEF) IVPB 2g/100 mL premix     2 g 200 mL/hr over 30 Minutes Intravenous Every 8 hours 08/19/17 0936     08/18/17 1130  vancomycin (VANCOCIN) IVPB 1000 mg/200 mL premix  Status:  Discontinued     1,000 mg 200 mL/hr over 60 Minutes Intravenous Every 8 hours 08/18/17 1049 08/19/17 0936   08/16/17 1330  vancomycin (VANCOCIN) IVPB 1000 mg/200 mL premix  Status:  Discontinued     1,000 mg 200 mL/hr over 60 Minutes Intravenous Every 8 hours 08/16/17 1305 08/17/17 0759   08/16/17 1330  piperacillin-tazobactam (ZOSYN) IVPB 3.375 g  Status:  Discontinued     3.375 g 12.5 mL/hr over 240 Minutes Intravenous Every 8 hours 08/16/17 1305 08/17/17 0759    08/16/17 1230  piperacillin-tazobactam (ZOSYN) IVPB 3.375 g  Status:  Discontinued     3.375 g 100 mL/hr over 30 Minutes Intravenous Every 6 hours 08/16/17 1221 08/16/17 1255   08/16/17 1230  vancomycin (VANCOCIN) IVPB 1000 mg/200 mL premix  Status:  Discontinued     1,000 mg 200 mL/hr over 60 Minutes Intravenous Every 12 hours 08/16/17 1221 08/16/17 1255   08/16/17 0045  piperacillin-tazobactam (ZOSYN) IVPB 3.375 g     3.375 g 100 mL/hr over 30 Minutes Intravenous  Once 08/16/17 0042 08/16/17 0358   08/16/17 0045  vancomycin (VANCOCIN) IVPB 1000 mg/200 mL premix     1,000 mg 200 mL/hr over 60 Minutes Intravenous  Once 08/16/17 0042 08/16/17 0358        Objective:   Vitals:   08/19/17 0200 08/19/17 0534 08/19/17 1730 08/20/17 0508  BP:  110/60 129/66 117/62  Pulse:  75 83 75  Resp:  18  18  Temp: 98.4 F (36.9 C) 98 F (36.7 C) 98.7 F (37.1 C) 98.1 F (36.7 C)  TempSrc:  Oral Oral Oral  SpO2:  99% 100% 100%  Weight:      Height:        Wt Readings from Last 3 Encounters:  08/15/17 81.6 kg (180 lb)  07/28/17 74.8 kg (165 lb)  01/20/17 74.8 kg (165 lb)    No intake or output data in the 24 hours ending 08/20/17 0955 Physical Exam  Gen: not in distress awake, alert, cooperative.  HEENT:  moist mucosa, supple neck Chest: clear b/l, no added sounds CVS: N S1&S2, no murmurs.   GI: soft, NT, ND, no HSM  Musculoskeletal: warm, Left lumbar paraspinal tenderness CNS:  no lower extremity neurological deficit    Data Review:    CBC  Recent Labs Lab 08/15/17 2257 08/16/17 1344 08/18/17 0618  WBC 13.2* 8.6 9.2  HGB 13.0 11.8*  11.2*  HCT 39.4 37.1* 33.7*  PLT 488* 445* 372  MCV 84.0 85.3 84.0  MCH 27.7 27.1 27.9  MCHC 33.0 31.8 33.2  RDW 13.9 14.1 14.1  LYMPHSABS 1.6  --   --   MONOABS 1.5*  --   --   EOSABS 0.1  --   --   BASOSABS 0.0  --   --     Chemistries   Recent Labs Lab 08/15/17 2257 08/16/17 1344 08/18/17 0618  NA 133*  --  132*  K 5.1  --   4.3  CL 95*  --  99*  CO2 27  --  23  GLUCOSE 120*  --  82  BUN 26*  --  13  CREATININE 0.79 0.84 0.75  CALCIUM 9.5  --  8.8*  AST 82*  --  77*  ALT 106*  --  90*  ALKPHOS 246*  --  190*  BILITOT 0.7  --  0.7   ------------------------------------------------------------------------------------------------------------------ No results for input(s): CHOL, HDL, LDLCALC, TRIG, CHOLHDL, LDLDIRECT in the last 72 hours.  Lab Results  Component Value Date   HGBA1C 6.4 (H) 08/16/2017   ------------------------------------------------------------------------------------------------------------------ No results for input(s): TSH, T4TOTAL, T3FREE, THYROIDAB in the last 72 hours.  Invalid input(s): FREET3 ------------------------------------------------------------------------------------------------------------------ No results for input(s): VITAMINB12, FOLATE, FERRITIN, TIBC, IRON, RETICCTPCT in the last 72 hours.  Coagulation profile  Recent Labs Lab 08/17/17 1005  INR 1.08    No results for input(s): DDIMER in the last 72 hours.  Cardiac Enzymes No results for input(s): CKMB, TROPONINI, MYOGLOBIN in the last 168 hours.  Invalid input(s): CK ------------------------------------------------------------------------------------------------------------------ No results found for: BNP  Inpatient Medications  Scheduled Meds: . enoxaparin (LOVENOX) injection  40 mg Subcutaneous Q24H  . insulin aspart  0-9 Units Subcutaneous TID WC  . oxyCODONE  10 mg Oral Q12H   Continuous Infusions: .  ceFAZolin (ANCEF) IV Stopped (08/20/17 0538)   PRN Meds:.acetaminophen, ketorolac, metoCLOPramide, ondansetron **OR** ondansetron (ZOFRAN) IV, oxyCODONE, sodium chloride flush, sorbitol  Micro Results Recent Results (from the past 240 hour(s))  Blood Culture (routine x 2)     Status: None (Preliminary result)   Collection Time: 08/16/17  1:35 AM  Result Value Ref Range Status   Specimen  Description BLOOD RIGHT ARM  Final   Special Requests   Final    BOTTLES DRAWN AEROBIC AND ANAEROBIC Blood Culture results may not be optimal due to an excessive volume of blood received in culture bottles   Culture   Final    NO GROWTH 3 DAYS Performed at [facility], [address] [place], [place] [address]    Report Status PENDING  Incomplete  Blood Culture (routine x 2)     Status: None (Preliminary result)   Collection Time: 08/16/17  1:40 AM  Result Value Ref Range Status   Specimen Description BLOOD RIGHT ARM  Final   Special Requests IN PEDIATRIC BOTTLE Blood Culture adequate volume  Final   Culture   Final    NO GROWTH 3 DAYS Performed at [facility], [address] [place], [place] [address]    Report Status PENDING  Incomplete  MRSA PCR Screening     Status: None   Collection Time: 08/16/17 12:09 PM  Result Value Ref Range Status   MRSA by PCR NEGATIVE NEGATIVE Final    Comment:        The GeneXpert MRSA Assay (FDA approved for NASAL specimens only), is one component of a comprehensive  MRSA colonization surveillance program. It is not intended to diagnose MRSA infection nor to guide or monitor treatment for MRSA infections.   Body fluid culture     Status: None   Collection Time: 08/17/17  5:06 PM  Result Value Ref Range Status   Specimen Description FLUID  Final   Special Requests DISC L3 L4  Final   Gram Stain   Final    RARE WBC PRESENT,BOTH PMN AND MONONUCLEAR NO ORGANISMS SEEN    Culture   Final    MODERATE STAPHYLOCOCCUS AUREUS CRITICAL RESULT CALLED TO, READ BACK BY AND VERIFIED WITH: H. MACINTOSH RN, AT 1041 08/18/17 BY D. VANHOOK REGARDING CULTURE GROWTH    Report Status 08/19/2017 FINAL  Final   Organism ID, Bacteria STAPHYLOCOCCUS AUREUS  Final      Susceptibility   Staphylococcus aureus - MIC*    CIPROFLOXACIN <=0.5 SENSITIVE Sensitive     ERYTHROMYCIN >=8 RESISTANT Resistant     GENTAMICIN <=0.5 SENSITIVE Sensitive      OXACILLIN 0.5 SENSITIVE Sensitive     TETRACYCLINE <=1 SENSITIVE Sensitive     VANCOMYCIN <=0.5 SENSITIVE Sensitive     TRIMETH/SULFA <=10 SENSITIVE Sensitive     CLINDAMYCIN <=0.25 SENSITIVE Sensitive     RIFAMPIN <=0.5 SENSITIVE Sensitive     Inducible Clindamycin NEGATIVE Sensitive     * MODERATE STAPHYLOCOCCUS AUREUS  Culture, fungus without smear     Status: None (Preliminary result)   Collection Time: 08/17/17  5:06 PM  Result Value Ref Range Status   Specimen Description FLUID  Final   Special Requests DISC L3 L4  Final   Culture NO FUNGUS ISOLATED AFTER 2 DAYS  Final   Report Status PENDING  Incomplete  Acid Fast Smear (AFB)     Status: None   Collection Time: 08/17/17  5:06 PM  Result Value Ref Range Status   AFB Specimen Processing Concentration  Final   Acid Fast Smear Negative  Final    Comment: (NOTE) Performed At: [facility] [address] [place], [place] [id number] Mila Homer MD WU:[id number]    Source (AFB) FLUID  Final    Comment: DISC L3 L4    Radiology Reports Abd 1 View (kub)  Result Date: 08/17/2017 CLINICAL DATA:  Back pain, constipation despite having multiple bowel movements yesterday. EXAM: ABDOMEN - 1 VIEW COMPARISON:  Abdominal and pelvic CT scan of August 16, 2017 FINDINGS: The colonic stool burden remains increased. There is no evidence of a small bowel obstruction or fecal impaction. No abnormal soft tissue calcifications are observed. The bony structures are unremarkable. IMPRESSION: Increased colonic stool burden compatible with constipation. No evidence of obstruction or fecal impaction. Electronically Signed   By: David  [place] M.D.   On: 08/17/2017 08:50   Mr Lumbar Spine W Wo Contrast  Result Date: 08/16/2017 CLINICAL DATA:  Abdominal and severe back pain. Constipation. History of intravenous drug abuse and endocarditis. CT findings suspicious for L3-4 discitis. EXAM: MRI LUMBAR SPINE WITHOUT AND WITH CONTRAST TECHNIQUE:  Multiplanar and multiecho pulse sequences of the lumbar spine were obtained without and with intravenous contrast. CONTRAST:  <See Chart> MULTIHANCE GADOBENATE DIMEGLUMINE 529 MG/ML IV SOLN COMPARISON:  Abdominopelvic CT 08/16/2017. FINDINGS: Segmentation: Conventional anatomy assumed, with the last open disc space designated L5-S1. Alignment:  Normal. Vertebrae: There is diffuse endplate edema and enhancement at L3-4 consistent with osteomyelitis. The intervening disc does not demonstrate significant T2 hyperintensity or abnormal enhancement. Paraspinal findings are described below. No other significant osseous findings.  The visualized sacroiliac joints appear unremarkable. Conus medullaris: Extends to the upper L1 level and appears normal. There is no abnormal intradural enhancement. There is epidural enhancement at L3-4, further described below. Paraspinal and other soft tissues: There is extensive paraspinal inflammation surrounding the L3 and L4 vertebral bodies with fairly homogeneous enhancement following contrast. There is asymmetric enhancement extending into the left L3-4 foramen. There is a small fluid collection inferiorly in the left L3-4 foramen which may contribute to left L3 nerve root encroachment. No other focal fluid collections are seen. There is anterior epidural enhancement at the L3 and L4 levels. Enhancement extends into the psoas musculature bilaterally. Probable slow flow in the iliac veins without definite DVT. Probable reactive retroperitoneal lymph nodes. Disc levels: No significant disc space findings from T11-12 through L2-3. L3-4: As above, of atypical findings of discitis, osteomyelitis and extensive paraspinal inflammation. There is anterior epidural enhancement and a small epidural abscess inferiorly in the left foramen, likely encroaching on the left L3 nerve root. These factors contribute to mass effect on the thecal sac and asymmetric narrowing of the left lateral recess. Marrow  edema extends into the left L4 pedicle. No definite facet joint infection demonstrated. L4-5: Mild disc bulging, facet and ligamentous hypertrophy. No disc herniation, spinal stenosis or nerve root encroachment. L5-S1: There is disc bulging with a broad-based left foraminal disc protrusion. This may contribute to left L5 nerve root encroachment in the canal. Mild bilateral facet hypertrophy. IMPRESSION: 1. MRI confirms the impression of atypical discitis at L3-4 with associated osteomyelitis and extensive paraspinal inflammation. There is a small epidural abscess within the left foramen and diffuse anterior epidural enhancement. 2. No other foci of infection demonstrated. 3. Disc bulging, facet ligamentous hypertrophy at L4-5 without resulting spinal stenosis or nerve root encroachment. 4. Broad-based left foraminal disc protrusion at L5-S1 may contribute to left L5 nerve root encroachment. Electronically Signed   By: Carey Bullocks M.D.   On: 08/16/2017 10:53   Ct Abdomen Pelvis W Contrast  Result Date: 08/16/2017 CLINICAL DATA:  Abdominal and severe low back pain. Abdominal distention. No bowel movement for 2 weeks. Attempted cells enema today by a lotion bottle. EXAM: CT ABDOMEN AND PELVIS WITH CONTRAST TECHNIQUE: Multidetector CT imaging of the abdomen and pelvis was performed using the standard protocol following bolus administration of intravenous contrast. CONTRAST:  ISOVUE-300 IOPAMIDOL (ISOVUE-300) INJECTION 61% COMPARISON:  None. FINDINGS: Lower chest: The lung bases are clear. Hepatobiliary: No focal liver abnormality is seen. No gallstones, gallbladder wall thickening, or biliary dilatation. Pancreas: No ductal dilatation or inflammation. Spleen: Normal in size without focal abnormality. Adrenals/Urinary Tract: Normal adrenal glands. Mild motion artifact through the kidneys. Homogeneous enhancement with symmetric excretion on delayed phase imaging. Stomach/Bowel: Stomach is nondistended. No  small bowel inflammation or obstruction. Fecalization of distal small bowel contents. Moderate large volume of stool throughout the entire colon, the distal most sigmoid colon is decompressed. No colonic wall thickening. No obstructing mass. Normal appendix. Vascular/Lymphatic: Aortic atherosclerosis without aneurysm. There are small retroperitoneal lymph nodes, for example aortocaval node measures 9 mm image 35 series 2. Additional small retroperitoneal nodes. Reproductive: Prostate is unremarkable. Other: Retroperitoneal edema and soft tissue stranding, most prominent in the region of L3-L4. No free air, free fluid, or intra-abdominal fluid collection. Musculoskeletal: Abnormal soft tissue density in the region of L3-L4 disc with adjacent retroperitoneal edema. Semi density extends into bilateral psoas muscles without dominant fluid collection. Question of abnormal epidural density L3-L4 level. There are early endplate  changes involving superior L4 and inferior L3 suspicious for osteomyelitis. IMPRESSION: 1. Findings suspicious for L3-L4 diskitis osteomyelitis with possible epidural component. Recommend lumbar spine MRI, with contrast if no contraindications. 2. Large colonic stool burden and fecalization of distal small bowel contents consistent with constipation and slow transit. 3.  Aortic Atherosclerosis (ICD10-I70.0). These results were called by telephone at the time of interpretation on 08/16/2017 at 12:38 am to Dr. Lynden Oxford , who verbally acknowledged these results. Electronically Signed   By: Rubye Oaks M.D.   On: 08/16/2017 00:39   Dg Knee Complete 4 Views Left  Result Date: 07/28/2017 CLINICAL DATA:  Acute onset of left knee pain after falling off ladder 1 week ago. Initial encounter. EXAM: LEFT KNEE - COMPLETE 4+ VIEW COMPARISON:  None. FINDINGS: There is no evidence of fracture or dislocation. The joint spaces are preserved. No significant degenerative change is seen; the  patellofemoral joint is grossly unremarkable in appearance. No significant joint effusion is seen. The visualized soft tissues are normal in appearance. IMPRESSION: No evidence of fracture or dislocation Electronically Signed   By: Roanna Raider M.D.   On: 07/28/2017 21:26   Dg Hip Unilat With Pelvis 2-3 Views Left  Result Date: 07/28/2017 CLINICAL DATA:  Acute onset of left hip pain after falling off ladder. Initial encounter. EXAM: DG HIP (WITH OR WITHOUT PELVIS) 2-3V LEFT COMPARISON:  None. FINDINGS: There is no evidence of fracture or dislocation. Both femoral heads are seated normally within their respective acetabula. The proximal left femur appears intact. No significant degenerative change is appreciated. The sacroiliac joints are unremarkable in appearance. The visualized bowel gas pattern is grossly unremarkable in appearance. IMPRESSION: No evidence of fracture or dislocation. Electronically Signed   By: Roanna Raider M.D.   On: 07/28/2017 21:25   Ir Lumbar Disc Aspiration W/img Guide  Result Date: 08/18/2017 INDICATION: Severe low back pain.  Discitis osteomyelitis at L3-L4. EXAM: FLUOROSCOPIC GUIDED DISC ASPIRATION MEDICATIONS: The patient is currently admitted to the hospital and receiving intravenous antibiotics. The antibiotics were administered within an appropriate time frame prior to the initiation of the procedure. ANESTHESIA/SEDATION: Fentanyl 2 mcg IV; Versed 50 mg IV.  Dilaudid 3 mg IV. Moderate Sedation Time:  20 minutes. The patient was continuously monitored during the procedure by the interventional radiology nurse under my direct supervision. COMPLICATIONS: None immediate. PROCEDURE: Informed written consent was obtained from the patient after a thorough discussion of the procedural risks, benefits and alternatives. All questions were addressed. Maximal Sterile Barrier Technique was utilized including caps, mask, sterile gowns, sterile gloves, sterile drape, hand hygiene and skin  antiseptic. A timeout was performed prior to the initiation of the procedure. The patient was laid prone on the fluoroscopic table. The skin overlying the lumbar region was then prepped and draped in the usual sterile fashion. The right L3-L4 disc space was then identified in a posterolateral view. The skin overlying this was then infiltrated with 0.25% bupivacaine. Using biplane intermittent fluoroscopy, a 21 gauge Franseen biopsy needle was then advanced into the L3-L4 disc space. Using a 20 mL syringe, aspiration was then performed. A second pass was made with a new sterile Franseen needle into a different location at L3-L4. Again using a 20 mL syringe, aspiration was obtained. The total amount of aspirate was minimal. This was sent for microbiological analysis, however. Hemostasis was achieved at the skin entry site. The patient tolerated the procedure well. IMPRESSION: Status post fluoroscopic guided disc aspiration at L3-L4 with 2 passes  with a 21 gauge Franseen needle. Electronically Signed   By: Julieanne Cotton M.D.   On: 08/17/2017 18:36   Time Spent in minutes 22  Clanford Johnson M.D on 08/20/2017 at 9:55 AM  Between 7am to 7pm - Pager - [phone]  After 7pm go to [email or web address] - password [facility]  [facility] -  Office  [phone]

## 2017-08-20 NOTE — Progress Notes (Signed)
Patient ID: Jesus Watkins, male   DOB: [birth year], 46 y.o.   MRN: [id number]          [facility] for Infectious Disease  Date of Admission:  08/15/2017      Day 3 antibiotics/2 cefazolin  ASSESSMENT: His lumbar aspirate cultures have grown MSSA. I will treat him with IV cefazolin for 6 weeks.   PLAN: 1. Continue cefazolin 2. I will sign off now  Diagnosis: Lumbar discitis  Culture Result: MSSA  No Known Allergies  OPAT Orders Discharge antibiotics: Per pharmacy protocol cefazolin  Duration: 6 weeks End Date: 09/29/2017  [facility] Care Per Protocol:  Labs weekly while on IV antibiotics: _x_ CBC with differential _x_ BMP __ CMP _x_ CRP _x_ ESR __ Vancomycin trough  __ Please pull PIC at completion of IV antibiotics _x_ Please leave PIC in place until doctor has seen patient or been notified  Fax weekly labs to [phone]  Clinic Follow Up Appt: I will arrange follow-up in my clinic in early October  Principal Problem:   Discitis Active Problems:   Hyperglycemia   Leukocytosis   Homeless   Heroin abuse   Constipation   Hyponatremia   Abnormal liver function tests   Aortic atherosclerosis (HCC)   Tobacco abuse   Hepatitis C antibody test positive   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 enoxaparin (LOVENOX) injection  40 mg Subcutaneous Q24H  . insulin aspart  0-9 Units Subcutaneous TID WC  . oxyCODONE  10 mg Oral Q12H    SUBJECTIVE: His pain is under good control.  Review of Systems: Review of Systems  Constitutional: Negative for chills, diaphoresis and fever.  Respiratory: Negative for cough.   Cardiovascular: Negative for chest pain.  Gastrointestinal: Negative for abdominal pain, diarrhea, nausea and vomiting.  Musculoskeletal: Positive for back pain. Negative for myalgias.  Skin: Negative for rash.  Neurological: Negative for headaches.    Psychiatric/Behavioral: Positive for substance abuse.    No Known Allergies  OBJECTIVE: Vitals:   08/19/17 0200 08/19/17 0534 08/19/17 1730 08/20/17 0508  BP:  110/60 129/66 117/62  Pulse:  75 83 75  Resp:  18  18  Temp: 98.4 F (36.9 C) 98 F (36.7 C) 98.7 F (37.1 C) 98.1 F (36.7 C)  TempSrc:  Oral Oral Oral  SpO2:  99% 100% 100%  Weight:      Height:       Body mass index is 28.19 kg/m.  Physical Exam  Constitutional: He is oriented to person, place, and time.  He is smiling and in good spirits. He appears much more comfortable today.  Cardiovascular: Normal rate and regular rhythm.   No murmur heard. Pulmonary/Chest: Effort normal and breath sounds normal. He has no wheezes. He has no rales.  Abdominal: Soft. There is no tenderness.  Neurological: He is alert and oriented to person, place, and time.  Skin: No rash noted.   New left arm PICC.  Psychiatric: Mood and affect normal.    Lab Results Lab Results  Component Value Date   WBC 9.2 08/18/2017   HGB 11.2 (L) 08/18/2017   HCT 33.7 (L) 08/18/2017   MCV 84.0 08/18/2017   PLT 372 08/18/2017    Lab Results  Component Value Date   CREATININE 0.75 08/18/2017   BUN 13 08/18/2017   NA 132 (L) 08/18/2017   K 4.3 08/18/2017   CL 99 (  L) 08/18/2017   CO2 23 08/18/2017    Lab Results  Component Value Date   ALT 90 (H) 08/18/2017   AST 77 (H) 08/18/2017   ALKPHOS 190 (H) 08/18/2017   BILITOT 0.7 08/18/2017     Microbiology: Recent Results (from the past 240 hour(s))  Blood Culture (routine x 2)     Status: None (Preliminary result)   Collection Time: 08/16/17  1:35 AM  Result Value Ref Range Status   Specimen Description BLOOD RIGHT ARM  Final   Special Requests   Final    BOTTLES DRAWN AEROBIC AND ANAEROBIC Blood Culture results may not be optimal due to an excessive volume of blood received in culture bottles   Culture   Final    NO GROWTH 3 DAYS Performed at [facility], [place] [address], [place], [place] [address]    Report Status PENDING  Incomplete  Blood Culture (routine x 2)     Status: None (Preliminary result)   Collection Time: 08/16/17  1:40 AM  Result Value Ref Range Status   Specimen Description BLOOD RIGHT ARM  Final   Special Requests IN PEDIATRIC BOTTLE Blood Culture adequate volume  Final   Culture   Final    NO GROWTH 3 DAYS Performed at [facility], [place] [address] [place], [place] [address]    Report Status PENDING  Incomplete  MRSA PCR Screening     Status: None   Collection Time: 08/16/17 12:09 PM  Result Value Ref Range Status   MRSA by PCR NEGATIVE NEGATIVE Final    Comment:        The GeneXpert MRSA Assay (FDA approved for NASAL specimens only), is one component of a comprehensive MRSA colonization surveillance program. It is not intended to diagnose MRSA infection nor to guide or monitor treatment for MRSA infections.   Body fluid culture     Status: None   Collection Time: 08/17/17  5:06 PM  Result Value Ref Range Status   Specimen Description FLUID  Final   Special Requests DISC L3 L4  Final   Gram Stain   Final    RARE WBC PRESENT,BOTH PMN AND MONONUCLEAR NO ORGANISMS SEEN    Culture   Final    MODERATE STAPHYLOCOCCUS AUREUS CRITICAL RESULT CALLED TO, READ BACK BY AND VERIFIED WITH: H. MACINTOSH RN, AT 1041 08/18/17 BY D. VANHOOK REGARDING CULTURE GROWTH    Report Status 08/19/2017 FINAL  Final   Organism ID, Bacteria STAPHYLOCOCCUS AUREUS  Final      Susceptibility   Staphylococcus aureus - MIC*    CIPROFLOXACIN <=0.5 SENSITIVE Sensitive     ERYTHROMYCIN >=8 RESISTANT Resistant     GENTAMICIN <=0.5 SENSITIVE Sensitive     OXACILLIN 0.5 SENSITIVE Sensitive     TETRACYCLINE <=1 SENSITIVE Sensitive     VANCOMYCIN <=0.5 SENSITIVE Sensitive     TRIMETH/SULFA <=10 SENSITIVE Sensitive     CLINDAMYCIN <=0.25 SENSITIVE Sensitive     RIFAMPIN <=0.5 SENSITIVE Sensitive     Inducible Clindamycin NEGATIVE Sensitive     *  MODERATE STAPHYLOCOCCUS AUREUS  Culture, fungus without smear     Status: None (Preliminary result)   Collection Time: 08/17/17  5:06 PM  Result Value Ref Range Status   Specimen Description FLUID  Final   Special Requests DISC L3 L4  Final   Culture NO FUNGUS ISOLATED AFTER 2 DAYS  Final   Report Status PENDING  Incomplete  Acid Fast Smear (AFB)  Status: None   Collection Time: 08/17/17  5:06 PM  Result Value Ref Range Status   AFB Specimen Processing Concentration  Final   Acid Fast Smear Negative  Final    Comment: (NOTE) Performed At: [facility] [address] [place], [place] [id number] Lindon Romp MD CW:[id number]    Source (AFB) FLUID  Final    Comment: [place] L3 L4    Michel Bickers, MD [facility] for Infectious [place] [facility] [phone] pager   [phone] cell 08/20/2017, 10:25 AM

## 2017-08-20 NOTE — Social Work (Addendum)
CSW spoke with Tacey RuizLeah at [facility] and she advised that clinicals are being reviewed for patient and she will let me know.    CSW called Stanton KidneyDebra at [facility] [place] and left a message requesting a call back to see if they can offer a bed for placement.  CSW [facility]ed [facility] to [facility] [place]. CSW will f/u.  Keene BreathPatricia Carlis Blanchard, LCSW Clinical Social Worker [phone]

## 2017-08-20 NOTE — Social Work (Signed)
CSW discussed case with Dr. Jacky KindleAronson and received 30 day LOG.  CSW f/u with the following SNF's: [facility], [facility], [facility] [place].  CS[facility].  [facility] [place] [place] [place].  Keene BreathPatricia Sherece Gambrill, LCSW Clinical Social Worker [phone]

## 2017-08-20 NOTE — Social Work (Addendum)
[facility] has declined patient for placement.  CSW faxed clinicals [facility] [place] [facility] [place].  CSW spoke with Gregary SignsBeth Moran in admission [facility] and she will review and follow-up.  12:13 CSW received message from [facility] that [facility] will not offer bed.  CSW spoke with Tacey RuizLeah at [facility] and she will f/u and get back to CSW.  CSW called Stanton KidneyDebra at [facility] [place] who will review clinicals and get back to CSW.  CSW will continue to follow.  Keene BreathPatricia Ayumi Wangerin, LCSW Clinical Social Worker [phone]

## 2017-08-20 NOTE — Clinical Social Work Note (Signed)
Clinical Social Work Assessment  Patient Details  Name: Jesus Watkins MRN: [id number] Date of Birth: 05-Nov-1971  Date of referral:  08/20/17               Reason for consult:  Facility Placement, Insurance Barriers, Housing Concerns/Homelessness, Substance Use/ETOH Abuse                Permission sought to share information with:  Facility Art therapist granted to share information::  Yes, Verbal Permission Granted  Name::        Agency::  SNF  Relationship::     Contact Information:     Housing/Transportation Living arrangements for the past 2 months:  Homeless Shelter Medical sales representative) Source of Information:  Patient Patient Interpreter Needed:  None Criminal Activity/Legal Involvement Pertinent to Current Situation/Hospitalization:  No - Comment as needed Significant Relationships:  Friend, Warehouse manager, Siblings Lives with:  Self, Facility Resident Do you feel safe going back to the place where you live?  Yes Need for family participation in patient care:  No (Coment)  Care giving concerns:  Pt has history of drug use and therefore will need SNF placement for IV treatment for back osteomyolitis. Pt agreeable to SNF. Pt in treatment at [facility] and has been clean and sober for 3 weeks.  Pt has minimal supports in the state of [place] and plans to relocate once completed treatment at [facility].  Social Worker assessment / plan:  CSW met with patient at bedside and discussed CSW role and the SNF options/placement. Pt in agreement to go to SNF. CSW explained process and discussed that SNF placement will not be in the local area due to insurance concerns. Pt agreeable. Pt gave CSW permission to send out for bed offers.  FL2 completed. Passr obtained. Offers sent.  Employment status:  Unemployed Forensic scientist:  Self Pay (Medicaid Pending) PT Recommendations:  [place] / Referral to community resources:  [place]  Patient/Family's Response to care:  Patient appreciative of CSW assistance and reports no issues with care or concern. Patient agreeable to SNF placement.  Patient/Family's Understanding of and Emotional Response to Diagnosis, Current Treatment, and Prognosis:  Patient has good understanding of diagnosis, current treatment, and prognosis and hopeful that he will improve and get back to [facility] treatment center. No issues or concerns identified.  Emotional Assessment Appearance:  Appears stated age Attitude/Demeanor/Rapport:   (Cooperative) Affect (typically observed):  Accepting, Appropriate Orientation:  Oriented to Self, Oriented to Place, Oriented to  Time, Oriented to Situation Alcohol / Substance use:  Illicit Drugs Psych involvement (Current and /or in the community):  No (Comment)  Discharge Needs  Concerns to be addressed:  Substance Abuse Concerns, Care Coordination, Homelessness Readmission within the last 30 days:  No Current discharge risk:  Homeless, Substance Abuse Barriers to Discharge:  No SNF bed, Homeless with medical needs, Inadequate or no insurance   Normajean Baxter, LCSW 08/20/2017, 10:11 AM

## 2017-08-21 LAB — HCV RNA QUANT RFLX ULTRA OR GENOTYP
HCV RNA QNT(LOG COPY/ML): 6.387 {Log_IU}/mL
HEPATITIS C QUANTITATION: 2440000 [IU]/mL

## 2017-08-21 LAB — GLUCOSE, CAPILLARY
GLUCOSE-CAPILLARY: 97 mg/dL (ref 65–99)
Glucose-Capillary: 105 mg/dL — ABNORMAL HIGH (ref 65–99)
Glucose-Capillary: 177 mg/dL — ABNORMAL HIGH (ref 65–99)

## 2017-08-21 LAB — CULTURE, BLOOD (ROUTINE X 2)
Culture: NO GROWTH
Culture: NO GROWTH
Special Requests: ADEQUATE

## 2017-08-21 LAB — HEPATITIS C GENOTYPE

## 2017-08-21 MED ORDER — SENNOSIDES-DOCUSATE SODIUM 8.6-50 MG PO TABS: 2.0000 | ORAL_TABLET | Freq: Two times a day (BID) | ORAL | 1 refills | Status: AC

## 2017-08-21 MED ORDER — CEFAZOLIN IV (FOR PTA / DISCHARGE USE ONLY): 2.0000 g | Freq: Three times a day (TID) | INTRAVENOUS | 0 refills | Status: AC

## 2017-08-21 MED ORDER — OXYCODONE HCL 10 MG PO TABS: 10.0000 mg | ORAL_TABLET | ORAL | 0 refills | Status: AC | PRN

## 2017-08-21 MED ORDER — ACETAMINOPHEN 325 MG PO TABS: 650.0000 mg | ORAL_TABLET | ORAL | Status: AC | PRN

## 2017-08-21 NOTE — Social Work (Addendum)
CSW followed up with Stanton Kidneyebra at [facility] [place] and she indicated that she would call CSW back shortly to discuss placement.  SNF has offered bed and [facility].  9:44 amCSW called [facility] and they indicated that they are short staffed and cannot transport patient today. CSW schedule transport for 9:00am on Saturday Sept.1st.  9:49 CSW called SNF and left msg for Debra in admission to confirm that they will accept weekend admissions. CSW f/u.  CSW spoke with Dr. Laural BenesJohnson to confirm wheelchair transport given the lumbar discitis and osteomyllitis and he confirmed.  CSW advised that patient cannot transport until tomorrow.  CSW will continue to f/u.  Keene BreathPatricia Lum [place], LCSW Clinical Social Worker [phone]

## 2017-08-21 NOTE — Care Management Note (Signed)
Case Management Note  Patient Details  Name: Jesus Watkins MRN: [id number] Date of Birth: 07/08/1971  Subjective/Objective:                 Patient with order to discharge to Skilled Nursing Facility (SNF). SNF discharge facilitated through Clinical Social Work (CSW). [facility] [place] [place].  Please refer to CSW notes for disposition plan and direct questions accordingly. Lawerance SabalDebbie Saren Corkern RN [employer] [phone].    Action/Plan:  DC to SNF Saturday.  Expected Discharge Date:                  Expected Discharge Plan:  Skilled Nursing Facility  In-House Referral:  Clinical Social Work  Discharge planning Services  CM Consult  Post Acute Care Choice:    Choice offered to:     DME Arranged:    DME Agency:     HH Arranged:    HH Agency:     Status of Service:  Completed, signed off  If discussed at [employer] Meetings, dates discussed:    Additional Comments:  Lawerance SabalDebbie Rokhaya Quinn, RN 08/21/2017, 10:00 AM

## 2017-08-21 NOTE — Progress Notes (Signed)
PROGRESS NOTE                                                                                                                                                                                                             Patient Demographics:    Jesus Watkins, is a 46 y.o. male, DOB - Jul 02, 1971, WUJ:[id number]  Admit date - 08/15/2017   Admitting Physician Briscoe Deutscher, MD  Outpatient Primary MD for the patient is Placey, Chales Abrahams, NP  LOS - 5  Outpatient Specialists:None  Chief Complaint  Patient presents with  . 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 He was discharged on IV vancomycin for 6 weeks duration. His back pain symptoms have worsened over the past 2 weeks and reports that he had fever of 101F at that rehabilitation. Vitals in the ED was normal. Blood work showed mild leukocytosis and transaminitis. Also had elevated lactic acid. MRI of the lumbar spine shows atypical discitis with osteomyelitis of the level of L3-L4 and extensive paraspinal inflammation. Small epidural abscess within the left foramen and diffuse anterior epidural enhancement noted. Patient admitted to hospitalist service.   Subjective:   Low back pain persistent, patient is willing to go to Rehab for full treatment.  He is scheduled to discharge tomorrow to SNF.      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 No  neurological deficit, bowel or urinary symptoms. Pain control with OxyIR and ER (stable on reduced dose). Avoid IV narcotics. 2-D echo done to rule out vegetation. ID has recommended IV cefazolin thru 09/29/17  Lumbar disc aspirated by IR on 8/27, preliminary growing moderate staph aureus. Culture sent for fungus and AFB.Marland Kitchen Antibiotic recommendations per ID.  Transaminitis Improving in a.m. labs. Hep C antibody positive and viral load ordered by ID, Dr. Orvan Falconer will follow  him outpatient to discuss treatment.   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  Constipation Resolved with enema. Laxatives as needed.    Tobacco abuse Counseled on cessation.  prediabetes Monitoring CBG (last 3)   Recent Labs  08/20/17 1721 08/20/17 2134 08/21/17 0604  GLUCAP 89 189* 105*   Code Status : Full code  Family Communication  : None at bedside  Disposition Plan  : SNF tomorrow   Barriers For Discharge : Active symptoms  Consults  :  IR ID  Procedures  :  CT abdomen MRI lumbar spine Lumbar Disc aspiration   DVT Prophylaxis  :  Lovenox -   Lab Results  Component Value Date   PLT 372 08/18/2017    Antibiotics  :   Anti-infectives    Start     Dose/Rate Route Frequency Ordered Stop   08/19/17 0945  ceFAZolin (ANCEF) IVPB 2g/100 mL premix     2 g 200 mL/hr over 30 Minutes Intravenous Every 8 hours 08/19/17 0936     08/18/17 1130  vancomycin (VANCOCIN) IVPB 1000 mg/200 mL premix  Status:  Discontinued     1,000 mg 200 mL/hr over 60 Minutes Intravenous Every 8 hours 08/18/17 1049 08/19/17 0936   08/16/17 1330  vancomycin (VANCOCIN) IVPB 1000 mg/200 mL premix  Status:  Discontinued     1,000 mg 200 mL/hr over 60 Minutes Intravenous Every 8 hours 08/16/17 1305 08/17/17 0759   08/16/17 1330  piperacillin-tazobactam (ZOSYN) IVPB 3.375 g  Status:  Discontinued     3.375 g 12.5 mL/hr over 240 Minutes Intravenous Every 8 hours  08/16/17 1305 08/17/17 0759   08/16/17 1230  piperacillin-tazobactam (ZOSYN) IVPB 3.375 g  Status:  Discontinued     3.375 g 100 mL/hr over 30 Minutes Intravenous Every 6 hours 08/16/17 1221 08/16/17 1255   08/16/17 1230  vancomycin (VANCOCIN) IVPB 1000 mg/200 mL premix  Status:  Discontinued     1,000 mg 200 mL/hr over 60 Minutes Intravenous Every 12 hours 08/16/17 1221 08/16/17 1255   08/16/17 0045  piperacillin-tazobactam (ZOSYN) IVPB 3.375 g     3.375 g 100 mL/hr over 30 Minutes Intravenous  Once 08/16/17 0042 08/16/17 0358   08/16/17 0045  vancomycin (VANCOCIN) IVPB 1000 mg/200 mL premix     1,000 mg 200 mL/hr over 60 Minutes Intravenous  Once 08/16/17 0042 08/16/17 0358        Objective:   Vitals:   08/20/17 1441 08/20/17 1700 08/20/17 2048 08/21/17 0526  BP: 136/71 124/73 116/68 (!) 117/59  Pulse: 86 76 73 64  Resp: 16     Temp: 99.4 F (37.4 C) 99.2 F (37.3 C) 98.7 F (37.1 C) 98.8 F (37.1 C)  TempSrc: Oral Oral Oral Oral  SpO2: 95% 100% 98% 98%  Weight:      Height:        Wt Readings from Last 3 Encounters:  08/15/17 81.6 kg (180 lb)  07/28/17 74.8 kg (165 lb)  01/20/17 74.8 kg (165 lb)     Intake/Output Summary (Last 24 hours) at 08/21/17 1021 Last data filed at 08/21/17 0900  Gross per 24 hour  Intake              360 ml  Output                0 ml  Net              360 ml  Physical Exam  Gen: not in distress awake, alert, cooperative.  HEENT:  moist mucosa, supple neck Chest: clear b/l, no added sounds CVS: N S1&S2, no murmurs.   GI: soft, NT, ND, no HSM  Musculoskeletal: warm, Left lumbar paraspinal tenderness CNS:  no lower extremity neurological deficit    Data Review:    CBC  Recent Labs Lab 08/15/17 2257 08/16/17 1344 08/18/17 0618  WBC 13.2* 8.6 9.2  HGB 13.0 11.8* 11.2*  HCT 39.4 37.1* 33.7*  PLT 488* 445* 372  MCV 84.0 85.3 84.0  MCH 27.7 27.1 27.9  MCHC 33.0 31.8 33.2  RDW 13.9 14.1 14.1  LYMPHSABS 1.6  --   --     MONOABS 1.5*  --   --   EOSABS 0.1  --   --   BASOSABS 0.0  --   --     Chemistries   Recent Labs Lab 08/15/17 2257 08/16/17 1344 08/18/17 0618  NA 133*  --  132*  K 5.1  --  4.3  CL 95*  --  99*  CO2 27  --  23  GLUCOSE 120*  --  82  BUN 26*  --  13  CREATININE 0.79 0.84 0.75  CALCIUM 9.5  --  8.8*  AST 82*  --  77*  ALT 106*  --  90*  ALKPHOS 246*  --  190*  BILITOT 0.7  --  0.7   ------------------------------------------------------------------------------------------------------------------ No results for input(s): CHOL, HDL, LDLCALC, TRIG, CHOLHDL, LDLDIRECT in the last 72 hours.  Lab Results  Component Value Date   HGBA1C 6.4 (H) 08/16/2017   ------------------------------------------------------------------------------------------------------------------ No results for input(s): TSH, T4TOTAL, T3FREE, THYROIDAB in the last 72 hours.  Invalid input(s): FREET3 ------------------------------------------------------------------------------------------------------------------ No results for input(s): VITAMINB12, FOLATE, FERRITIN, TIBC, IRON, RETICCTPCT in the last 72 hours.  Coagulation profile  Recent Labs Lab 08/17/17 1005  INR 1.08    No results for input(s): DDIMER in the last 72 hours.  Cardiac Enzymes No results for input(s): CKMB, TROPONINI, MYOGLOBIN in the last 168 hours.  Invalid input(s): CK ------------------------------------------------------------------------------------------------------------------ No results found for: BNP  Inpatient Medications  Scheduled Meds: . enoxaparin (LOVENOX) injection  40 mg Subcutaneous Q24H  . insulin aspart  0-9 Units Subcutaneous TID WC  . oxyCODONE  10 mg Oral Q12H   Continuous Infusions: .  ceFAZolin (ANCEF) IV 2 g (08/21/17 0536)   PRN Meds:.acetaminophen, ketorolac, metoCLOPramide, ondansetron **OR** ondansetron (ZOFRAN) IV, oxyCODONE, sodium chloride flush, sorbitol  Micro Results Recent  Results (from the past 240 hour(s))  Blood Culture (routine x 2)     Status: None (Preliminary result)   Collection Time: 08/16/17  1:35 AM  Result Value Ref Range Status   Specimen Description BLOOD RIGHT ARM  Final   Special Requests   Final    BOTTLES DRAWN AEROBIC AND ANAEROBIC Blood Culture results may not be optimal due to an excessive volume of blood received in culture bottles   Culture   Final    NO GROWTH 4 DAYS Performed at [facility], [address], [place], [place] [address]    Report Status PENDING  Incomplete  Blood Culture (routine x 2)     Status: None (Preliminary result)   Collection Time: 08/16/17  1:40 AM  Result Value Ref Range Status   Specimen Description BLOOD RIGHT ARM  Final   Special Requests IN PEDIATRIC BOTTLE Blood Culture adequate volume  Final   Culture   Final    NO GROWTH 4  DAYS Performed at [facility], [address] [place], [place] [address]    Report Status PENDING  Incomplete  MRSA PCR Screening     Status: None   Collection Time: 08/16/17 12:09 PM  Result Value Ref Range Status   MRSA by PCR NEGATIVE NEGATIVE Final    Comment:        The GeneXpert MRSA Assay (FDA approved for NASAL specimens only), is one component of a comprehensive MRSA colonization surveillance program. It is not intended to diagnose MRSA infection nor to guide or monitor treatment for MRSA infections.   Body fluid culture     Status: None   Collection Time: 08/17/17  5:06 PM  Result Value Ref Range Status   Specimen Description FLUID  Final   Special Requests DISC L3 L4  Final   Gram Stain   Final    RARE WBC PRESENT,BOTH PMN AND MONONUCLEAR NO ORGANISMS SEEN    Culture   Final    MODERATE STAPHYLOCOCCUS AUREUS CRITICAL RESULT CALLED TO, READ BACK BY AND VERIFIED WITH: H. MACINTOSH RN, AT 1041 08/18/17 BY D. VANHOOK REGARDING CULTURE GROWTH    Report Status 08/19/2017 FINAL  Final   Organism ID, Bacteria STAPHYLOCOCCUS AUREUS  Final       Susceptibility   Staphylococcus aureus - MIC*    CIPROFLOXACIN <=0.5 SENSITIVE Sensitive     ERYTHROMYCIN >=8 RESISTANT Resistant     GENTAMICIN <=0.5 SENSITIVE Sensitive     OXACILLIN 0.5 SENSITIVE Sensitive     TETRACYCLINE <=1 SENSITIVE Sensitive     VANCOMYCIN <=0.5 SENSITIVE Sensitive     TRIMETH/SULFA <=10 SENSITIVE Sensitive     CLINDAMYCIN <=0.25 SENSITIVE Sensitive     RIFAMPIN <=0.5 SENSITIVE Sensitive     Inducible Clindamycin NEGATIVE Sensitive     * MODERATE STAPHYLOCOCCUS AUREUS  Culture, fungus without smear     Status: None (Preliminary result)   Collection Time: 08/17/17  5:06 PM  Result Value Ref Range Status   Specimen Description FLUID  Final   Special Requests DISC L3 L4  Final   Culture NO FUNGUS ISOLATED AFTER 3 DAYS  Final   Report Status PENDING  Incomplete  Acid Fast Smear (AFB)     Status: None   Collection Time: 08/17/17  5:06 PM  Result Value Ref Range Status   AFB Specimen Processing Concentration  Final   Acid Fast Smear Negative  Final    Comment: (NOTE) Performed At: [facility] [place] [address] [place], [place] [id number] Mila HomerHancock William F MD XB:[id number]:[phone]    Source (AFB) FLUID  Final    Comment: DISC L3 L4    Radiology Reports Abd 1 View (kub)  Result Date: 08/17/2017 CLINICAL DATA:  Back pain, constipation despite having multiple bowel movements yesterday. EXAM: ABDOMEN - 1 VIEW COMPARISON:  Abdominal and pelvic CT scan of August 16, 2017 FINDINGS: The colonic stool burden remains increased. There is no evidence of a small bowel obstruction or fecal impaction. No abnormal soft tissue calcifications are observed. The bony structures are unremarkable. IMPRESSION: Increased colonic stool burden compatible with constipation. No evidence of obstruction or fecal impaction. Electronically Signed   By: David  SwazilandJordan M.D.   On: 08/17/2017 08:50   Mr Lumbar Spine W Wo Contrast  Result Date: 08/16/2017 CLINICAL DATA:  Abdominal and severe back  pain. Constipation. History of intravenous drug abuse and endocarditis. CT findings suspicious for L3-4 discitis. EXAM: MRI LUMBAR SPINE WITHOUT AND WITH CONTRAST TECHNIQUE: Multiplanar and multiecho  pulse sequences of the lumbar spine were obtained without and with intravenous contrast. CONTRAST:  <See Chart> MULTIHANCE GADOBENATE DIMEGLUMINE 529 MG/ML IV SOLN COMPARISON:  Abdominopelvic CT 08/16/2017. FINDINGS: Segmentation: Conventional anatomy assumed, with the last open disc space designated L5-S1. Alignment:  Normal. Vertebrae: There is diffuse endplate edema and enhancement at L3-4 consistent with osteomyelitis. The intervening disc does not demonstrate significant T2 hyperintensity or abnormal enhancement. Paraspinal findings are described below. No other significant osseous findings. The visualized sacroiliac joints appear unremarkable. Conus medullaris: Extends to the upper L1 level and appears normal. There is no abnormal intradural enhancement. There is epidural enhancement at L3-4, further described below. Paraspinal and other soft tissues: There is extensive paraspinal inflammation surrounding the L3 and L4 vertebral bodies with fairly homogeneous enhancement following contrast. There is asymmetric enhancement extending into the left L3-4 foramen. There is a small fluid collection inferiorly in the left L3-4 foramen which may contribute to left L3 nerve root encroachment. No other focal fluid collections are seen. There is anterior epidural enhancement at the L3 and L4 levels. Enhancement extends into the psoas musculature bilaterally. Probable slow flow in the iliac veins without definite DVT. Probable reactive retroperitoneal lymph nodes. Disc levels: No significant disc space findings from T11-12 through L2-3. L3-4: As above, of atypical findings of discitis, osteomyelitis and extensive paraspinal inflammation. There is anterior epidural enhancement and a small epidural abscess inferiorly in the  left foramen, likely encroaching on the left L3 nerve root. These factors contribute to mass effect on the thecal sac and asymmetric narrowing of the left lateral recess. Marrow edema extends into the left L4 pedicle. No definite facet joint infection demonstrated. L4-5: Mild disc bulging, facet and ligamentous hypertrophy. No disc herniation, spinal stenosis or nerve root encroachment. L5-S1: There is disc bulging with a broad-based left foraminal disc protrusion. This may contribute to left L5 nerve root encroachment in the canal. Mild bilateral facet hypertrophy. IMPRESSION: 1. MRI confirms the impression of atypical discitis at L3-4 with associated osteomyelitis and extensive paraspinal inflammation. There is a small epidural abscess within the left foramen and diffuse anterior epidural enhancement. 2. No other foci of infection demonstrated. 3. Disc bulging, facet ligamentous hypertrophy at L4-5 without resulting spinal stenosis or nerve root encroachment. 4. Broad-based left foraminal disc protrusion at L5-S1 may contribute to left L5 nerve root encroachment. Electronically Signed   By: Carey Bullocks M.D.   On: 08/16/2017 10:53   Ct Abdomen Pelvis W Contrast  Result Date: 08/16/2017 CLINICAL DATA:  Abdominal and severe low back pain. Abdominal distention. No bowel movement for 2 weeks. Attempted cells enema today by a lotion bottle. EXAM: CT ABDOMEN AND PELVIS WITH CONTRAST TECHNIQUE: Multidetector CT imaging of the abdomen and pelvis was performed using the standard protocol following bolus administration of intravenous contrast. CONTRAST:  ISOVUE-300 IOPAMIDOL (ISOVUE-300) INJECTION 61% COMPARISON:  None. FINDINGS: Lower chest: The lung bases are clear. Hepatobiliary: No focal liver abnormality is seen. No gallstones, gallbladder wall thickening, or biliary dilatation. Pancreas: No ductal dilatation or inflammation. Spleen: Normal in size without focal abnormality. Adrenals/Urinary Tract: Normal  adrenal glands. Mild motion artifact through the kidneys. Homogeneous enhancement with symmetric excretion on delayed phase imaging. Stomach/Bowel: Stomach is nondistended. No small bowel inflammation or obstruction. Fecalization of distal small bowel contents. Moderate large volume of stool throughout the entire colon, the distal most sigmoid colon is decompressed. No colonic wall thickening. No obstructing mass. Normal appendix. Vascular/Lymphatic: Aortic atherosclerosis without aneurysm. There are small retroperitoneal lymph  nodes, for example aortocaval node measures 9 mm image 35 series 2. Additional small retroperitoneal nodes. Reproductive: Prostate is unremarkable. Other: Retroperitoneal edema and soft tissue stranding, most prominent in the region of L3-L4. No free air, free fluid, or intra-abdominal fluid collection. Musculoskeletal: Abnormal soft tissue density in the region of L3-L4 disc with adjacent retroperitoneal edema. Semi density extends into bilateral psoas muscles without dominant fluid collection. Question of abnormal epidural density L3-L4 level. There are early endplate changes involving superior L4 and inferior L3 suspicious for osteomyelitis. IMPRESSION: 1. Findings suspicious for L3-L4 diskitis osteomyelitis with possible epidural component. Recommend lumbar spine MRI, with contrast if no contraindications. 2. Large colonic stool burden and fecalization of distal small bowel contents consistent with constipation and slow transit. 3.  Aortic Atherosclerosis (ICD10-I70.0). These results were called by telephone at the time of interpretation on 08/16/2017 at 12:38 am to Dr. Lynden Oxford , who verbally acknowledged these results. Electronically Signed   By: Rubye Oaks M.D.   On: 08/16/2017 00:39   Dg Knee Complete 4 Views Left  Result Date: 07/28/2017 CLINICAL DATA:  Acute onset of left knee pain after falling off ladder 1 week ago. Initial encounter. EXAM: LEFT KNEE - COMPLETE  4+ VIEW COMPARISON:  None. FINDINGS: There is no evidence of fracture or dislocation. The joint spaces are preserved. No significant degenerative change is seen; the patellofemoral joint is grossly unremarkable in appearance. No significant joint effusion is seen. The visualized soft tissues are normal in appearance. IMPRESSION: No evidence of fracture or dislocation Electronically Signed   By: Roanna Raider M.D.   On: 07/28/2017 21:26   Dg Hip Unilat With Pelvis 2-3 Views Left  Result Date: 07/28/2017 CLINICAL DATA:  Acute onset of left hip pain after falling off ladder. Initial encounter. EXAM: DG HIP (WITH OR WITHOUT PELVIS) 2-3V LEFT COMPARISON:  None. FINDINGS: There is no evidence of fracture or dislocation. Both femoral heads are seated normally within their respective acetabula. The proximal left femur appears intact. No significant degenerative change is appreciated. The sacroiliac joints are unremarkable in appearance. The visualized bowel gas pattern is grossly unremarkable in appearance. IMPRESSION: No evidence of fracture or dislocation. Electronically Signed   By: Roanna Raider M.D.   On: 07/28/2017 21:25   Ir Lumbar Disc Aspiration W/img Guide  Result Date: 08/18/2017 INDICATION: Severe low back pain.  Discitis osteomyelitis at L3-L4. EXAM: FLUOROSCOPIC GUIDED DISC ASPIRATION MEDICATIONS: The patient is currently admitted to the hospital and receiving intravenous antibiotics. The antibiotics were administered within an appropriate time frame prior to the initiation of the procedure. ANESTHESIA/SEDATION: Fentanyl 2 mcg IV; Versed 50 mg IV.  Dilaudid 3 mg IV. Moderate Sedation Time:  20 minutes. The patient was continuously monitored during the procedure by the interventional radiology nurse under my direct supervision. COMPLICATIONS: None immediate. PROCEDURE: Informed written consent was obtained from the patient after a thorough discussion of the procedural risks, benefits and alternatives.  All questions were addressed. Maximal Sterile Barrier Technique was utilized including caps, mask, sterile gowns, sterile gloves, sterile drape, hand hygiene and skin antiseptic. A timeout was performed prior to the initiation of the procedure. The patient was laid prone on the fluoroscopic table. The skin overlying the lumbar region was then prepped and draped in the usual sterile fashion. The right L3-L4 disc space was then identified in a posterolateral view. The skin overlying this was then infiltrated with 0.25% bupivacaine. Using biplane intermittent fluoroscopy, a 21 gauge Franseen biopsy needle was then advanced  into the L3-L4 disc space. Using a 20 mL syringe, aspiration was then performed. A second pass was made with a new sterile Franseen needle into a different location at L3-L4. Again using a 20 mL syringe, aspiration was obtained. The total amount of aspirate was minimal. This was sent for microbiological analysis, however. Hemostasis was achieved at the skin entry site. The patient tolerated the procedure well. IMPRESSION: Status post fluoroscopic guided disc aspiration at L3-L4 with 2 passes with a 21 gauge Franseen needle. Electronically Signed   By: Julieanne Cotton M.D.   On: 08/17/2017 18:36   Time Spent in minutes  21 mins  Antoni Stefan M.D on 08/21/2017 at 10:21 AM  Between 7am to 7pm - Pager - [phone]  After 7pm go to [email or web address] - password [facility]  [facility] -  Office  [phone]

## 2017-08-21 NOTE — Discharge Summary (Addendum)
Physician Discharge Summary  Jesus Watkins TTS:[id number] DOB: [birth year] DOA: 08/15/2017  PCP: Marliss Coots, NP  Admit date: 08/15/2017 Discharge date: 08/22/2017  Admitted From: HOME  Disposition: SNF  Recommendations for Outpatient Follow-up:  1. Follow up with Infectious Disease in 6 weeks 2. Please obtain Labs - Once weekly:  CBC/D and BMP, Labs - Every other week:  ESR and CRP 3. Continue IV cefazolin thru end date: 09/29/17 4. Maintain PICC line until follow up with Infectious Disease   Discharge Condition: STABLE   CODE STATUS: FULL    Brief Hospitalization Summary: Please see all hospital notes, images, labs for full details of the hospitalization. HPI: Jesus Watkins is a 46 y.o. 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 He was seen in our ED on August 8 complaining of knee and hip pain at that time no blood work was obtained the patient was prescribed nonsteroidals and Skelaxin for his discomfort. Subsequent to that has developed significant constipation no bowel movement in a weeks. He also has complained of some abdominal pain radiating 8 out of 10 associated with nausea and constipation. He has tried a homemade enema with little results or relief. His pain is associated with constipation and abdominal pain he has no dysuria he has had normal flatus. He has had adequate water intake.   His last heroin use was 3 weeks ago through IV injection. He has developed back pain since he developed his constipation during that same timeframe. He has had no difficulty with urination. He is use some Suboxone but no heroin IV since developing the constipation. Abdominal pain as a tightness cramping and aching severe he also complains of severe back pain across his low back. But no numbness or tingling of his legs. He has no traumatic injuries he  denies fevers chills chest pain shortness of breath or cough. He has no current neurological deficits.   ED Course: Patient was fully evaluated and found to have some abdominal tenderness and distention laboratory testing revealed a mild leukocytosis, normal lactic acid, elevated liver function tests normal kidney function. CT scan of the abdomen did not show any impaction but showed a large stool burden and likely discitis/osteomyelitis of the L 3-L4 disc. Patient referred to Hospital medicine for management of discitis and severe constipation possibly related to neurogenic issues from the discitis.  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 No neurological deficit, bowel or urinary symptoms. Pain control with OxyIR and ER (stable on reduced dose). Avoid IV narcotics. 2-D echo done to rule out vegetation. ID has recommended IV cefazolin thru 09/29/17  Lumbar disc aspirated by IR on 8/27, preliminary growing moderate staph aureus. Culture sent for fungus and AF[employer].Marland Kitchen Antibiotic recommendations per ID.  Transaminitis Improving. Hep C antibody positive and viral load ordered by ID, Dr. Megan Salon will follow him outpatient to discuss treatment.   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  Constipation Resolved with enema. Regularly scheduled laxative therapy ordered.     Tobacco abuse Counseled on cessation.  Prediabetes  Monitoring CBG (last 3)   Recent Labs (last 2 labs)    Recent Labs  08/20/17 1721 08/20/17 2134 08/21/17 0604  GLUCAP 89 189* 105*  Code Status : Full code  Family Communication  : None at bedside  Disposition Plan:   SNF 08/22/17  Barriers For Discharge : Active symptoms  Consults  :  IR ID  Procedures  :  CT abdomen MRI lumbar spine Lumbar  Disc aspiration   DVT Prophylaxis  :  Lovenox  Discharge Diagnoses:  Principal Problem:   Discitis Active Problems:   Hyperglycemia   Leukocytosis   Homeless   Heroin abuse   Constipation   Hyponatremia   Abnormal liver function tests   Aortic atherosclerosis (HCC)   Tobacco abuse   Hepatitis C antibody test positive   Abscess in epidural space of L2-L5 lumbar spine   Acute low back pain without sciatica   Generalized abdominal pain  Discharge Instructions: Discharge Instructions    Call MD for:  difficulty breathing, headache or visual disturbances    Complete by:  As directed    Call MD for:  extreme fatigue    Complete by:  As directed    Call MD for:  hives    Complete by:  As directed    Call MD for:  persistant dizziness or light-headedness    Complete by:  As directed    Call MD for:  persistant nausea and vomiting    Complete by:  As directed    Call MD for:  severe uncontrolled pain    Complete by:  As directed    Call MD for:  temperature >100.4    Complete by:  As directed    Diet general    Complete by:  As directed    Home infusion instructions [place] May follow [place] Dosing Protocol; May administer Cathflo as needed to maintain patency of vascular access device.; Flushing of vascular access device: per [facility] Protocol: 0.9% NaCl pre/post medica...    Complete by:  As directed    Instructions:  May follow [place] Dosing Protocol   Instructions:  May administer Cathflo as needed to maintain patency of vascular access device.   Instructions:  Flushing of vascular access device: per [facility] Protocol: 0.9% NaCl pre/post medication administration and prn patency; Heparin 100 u/ml, 35m for implanted ports and Heparin 10u/ml, 533mfor all other central venous catheters.   Instructions:  May follow [facility] Anaphylaxis Protocol for First Dose Administration in the home: 0.9% NaCl at 25-50 ml/hr to maintain IV access for protocol meds. Epinephrine 0.3 ml  IV/IM PRN and Benadryl 25-50 IV/IM PRN s/s of anaphylaxis.   Instructions:  [place] Coord[place] (RN) to assist per patient IV care needs in the home PRN.   Increase activity slowly    Complete by:  As directed      Allergies as of 08/22/2017   No Known Allergies     Medication List    STOP taking these medications   aspirin 81 MG EC tablet   folic acid 1 MG tablet Commonly known as:  FOLVITE   hydrOXYzine 25 MG tablet Commonly known as:  ATARAX/VISTARIL   LORazepam 1 MG tablet Commonly known as:  ATIVAN   metaxalone 800 MG tablet Commonly known as:  SKELAXIN   methocarbamol 500 MG tablet Commonly known as:  ROBAXIN   multivitamin with minerals Tabs tablet   naproxen 500 MG tablet Commonly known as:  NAPROSYN   naproxen sodium 220 MG tablet Commonly known as:  ANAPROX   thiamine 100 MG tablet   vancomycin IVPB     TAKE these medications  acetaminophen 325 MG tablet Commonly known as:  TYLENOL Take 2 tablets (650 mg total) by mouth every 4 (four) hours as needed for mild pain, fever or headache.   ceFAZolin IVPB Commonly known as:  ANCEF Inject 2 g into the vein every 8 (eight) hours. Indication:  MSSA lumbar discitis Last Day of Therapy:  09/29/17 Labs - Once weekly:  CBC/D and BMP, Labs - Every other week:  ESR and CRP   ibuprofen 200 MG tablet Commonly known as:  ADVIL,MOTRIN Take 800 mg by mouth every 6 (six) hours as needed for mild pain. What changed:  Another medication with the same name was removed. Continue taking this medication, and follow the directions you see here.   Oxycodone HCl 10 MG Tabs Take 1 tablet (10 mg total) by mouth every 4 (four) hours as needed for moderate pain.   oxyCODONE 10 mg 12 hr tablet Commonly known as:  OXYCONTIN Take 1 tablet (10 mg total) by mouth every 12 (twelve) hours.   senna-docusate 8.6-50 MG tablet Commonly known as:  Senokot-S Take 2 tablets by mouth 2 (two) times daily.             Home Infusion Instuctions        Start     Ordered   08/21/17 0000  Home infusion instructions [facility] May follow [place] Dosing Protocol; May administer Cathflo as needed to maintain patency of vascular access device.; Flushing of vascular access device: per [facility] Protocol: 0.9% NaCl pre/post medica...    Question Answer Comment  Instructions May follow [place] Dosing Protocol   Instructions May administer Cathflo as needed to maintain patency of vascular access device.   Instructions Flushing of vascular access device: per [facility] Protocol: 0.9% NaCl pre/post medication administration and prn patency; Heparin 100 u/ml, 104m for implanted ports and Heparin 10u/ml, 537mfor all other central venous catheters.   Instructions May follow [facility] Anaphylaxis Protocol for First Dose Administration in the home: 0.9% NaCl at 25-50 ml/hr to maintain IV access for protocol meds. Epinephrine 0.3 ml IV/IM PRN and Benadryl 25-50 IV/IM PRN s/s of anaphylaxis.   Instructions [facility] Infusion Coordinator (RN) to assist per patient IV care needs in the home PRN.      08/21/17 1631       Discharge Care Instructions        Start     Ordered   08/22/17 0000  oxyCODONE (OXYCONTIN) 10 mg 12 hr tablet  Every 12 hours     08/22/17 0947   08/21/17 0000  Home infusion instructions [facility] May follow [facility]; May administer Cathflo as needed to maintain patency of vascular access device.; Flushing [facility] vascular access [facility]: 0.9% NaCl pre/post medica...    Question Answer Comment  Instructions [facility] follow [facility] [place] Protocol   Instructions May administer Cathflo as needed to maintain patency of vascular access device.   Instructions Flushing of vascular access device: per [facility]: 0.9% NaCl pre/post medication administration and prn patency; Heparin 100 u/ml, 41m34mor implanted ports and Heparin 10u/ml, 41ml61mr all other central  venous catheters.   Instructions May follow [facility] Anaphylaxis Protocol for First Dose Administration in the home: 0.9% NaCl at 25-50 ml/hr to maintain IV access for protocol meds. Epinephrine 0.3 ml IV/IM PRN and Benadryl 25-50 IV/IM PRN s/s of anaphylaxis.   Instructions [facility] Infusion Coordinator (RN) to assist per patient IV care needs  in the home PRN.      08/21/17 1631   08/21/17 0000  ceFAZolin (ANCEF) IVPB  Every 8 hours     08/21/17 1631   08/21/17 0000  oxyCODONE 10 MG TABS  Every 4 hours PRN     08/21/17 1631   08/21/17 0000  acetaminophen (TYLENOL) 325 MG tablet  Every 4 hours PRN     08/21/17 1631   08/21/17 0000  Increase activity slowly     08/21/17 1631   08/21/17 0000  Diet general     08/21/17 1631   08/21/17 0000  Call MD for:  temperature >100.4     08/21/17 1631   08/21/17 0000  Call MD for:  persistant nausea and vomiting     08/21/17 1631   08/21/17 0000  Call MD for:  severe uncontrolled pain     08/21/17 1631   08/21/17 0000  Call MD for:  difficulty breathing, headache or visual disturbances     08/21/17 1631   08/21/17 0000  Call MD for:  hives     08/21/17 1631   08/21/17 0000  Call MD for:  extreme fatigue     08/21/17 1631   08/21/17 0000  Call MD for:  persistant dizziness or light-headedness     08/21/17 1631   08/21/17 0000  senna-docusate (SENOKOT-S) 8.6-50 MG tablet  2 times daily     08/21/17 1638     Follow-up Information    Placey, Audrea Muscat, NP Follow up in 1 month(s).   Contact information: [place] [address] [phone]        Michel Bickers, MD. Schedule an appointment as soon as possible for a visit in 6 week(s).   Specialty:  Infectious Diseases Contact information: [address] [place] [address] [phone]          No Known Allergies Current Discharge Medication List    START taking these medications   Details  acetaminophen (TYLENOL) 325 MG tablet Take 2  tablets (650 mg total) by mouth every 4 (four) hours as needed for mild pain, fever or headache.    ceFAZolin (ANCEF) IVPB Inject 2 g into the vein every 8 (eight) hours. Indication:  MSSA lumbar discitis Last Day of Therapy:  09/29/17 Labs - Once weekly:  CBC/D and BMP, Labs - Every other week:  ESR and CRP Qty: 117 Units, Refills: 0    oxyCODONE (OXYCONTIN) 10 mg 12 hr tablet Take 1 tablet (10 mg total) by mouth every 12 (twelve) hours. Qty: 14 tablet, Refills: 0    oxyCODONE 10 MG TABS Take 1 tablet (10 mg total) by mouth every 4 (four) hours as needed for moderate pain. Qty: 20 tablet, Refills: 0    senna-docusate (SENOKOT-S) 8.6-50 MG tablet Take 2 tablets by mouth 2 (two) times daily. Qty: 30 tablet, Refills: 1      CONTINUE these medications which have NOT CHANGED   Details  ibuprofen (ADVIL,MOTRIN) 200 MG tablet Take 800 mg by mouth every 6 (six) hours as needed for mild pain.      STOP taking these medications     naproxen sodium (ANAPROX) 220 MG tablet      aspirin EC 81 MG EC tablet      folic acid (FOLVITE) 1 MG tablet      hydrOXYzine (ATARAX/VISTARIL) 25 MG tablet      LORazepam (ATIVAN) 1 MG tablet      metaxalone (SKELAXIN) 800 MG  tablet      methocarbamol (ROBAXIN) 500 MG tablet      Multiple Vitamin (MULTIVITAMIN WITH MINERALS) TABS tablet      naproxen (NAPROSYN) 500 MG tablet      thiamine 100 MG tablet      vancomycin IVPB        Procedures/Studies: Abd 1 View (kub)  Result Date: 08/17/2017 CLINICAL DATA:  Back pain, constipation despite having multiple bowel movements yesterday. EXAM: ABDOMEN - 1 VIEW COMPARISON:  Abdominal and pelvic CT scan of August 16, 2017 FINDINGS: The colonic stool burden remains increased. There is no evidence of a small bowel obstruction or fecal impaction. No abnormal soft tissue calcifications are observed. The bony structures are unremarkable. IMPRESSION: Increased colonic stool burden compatible with constipation.  No evidence of obstruction or fecal impaction. Electronically Signed   By: David  Martinique M.D.   On: 08/17/2017 08:50   Mr Lumbar Spine W Wo Contrast  Result Date: 08/16/2017 CLINICAL DATA:  Abdominal and severe back pain. Constipation. History of intravenous drug abuse and endocarditis. CT findings suspicious for L3-4 discitis. EXAM: MRI LUMBAR SPINE WITHOUT AND WITH CONTRAST TECHNIQUE: Multiplanar and multiecho pulse sequences of the lumbar spine were obtained without and with intravenous contrast. CONTRAST:  <See Chart> MULTIHANCE GADOBENATE DIMEGLUMINE 529 MG/ML IV SOLN COMPARISON:  Abdominopelvic CT 08/16/2017. FINDINGS: Segmentation: Conventional anatomy assumed, with the last open disc space designated L5-S1. Alignment:  Normal. Vertebrae: There is diffuse endplate edema and enhancement at L3-4 consistent with osteomyelitis. The intervening disc does not demonstrate significant T2 hyperintensity or abnormal enhancement. Paraspinal findings are described below. No other significant osseous findings. The visualized sacroiliac joints appear unremarkable. Conus medullaris: Extends to the upper L1 level and appears normal. There is no abnormal intradural enhancement. There is epidural enhancement at L3-4, further described below. Paraspinal and other soft tissues: There is extensive paraspinal inflammation surrounding the L3 and L4 vertebral bodies with fairly homogeneous enhancement following contrast. There is asymmetric enhancement extending into the left L3-4 foramen. There is a small fluid collection inferiorly in the left L3-4 foramen which may contribute to left L3 nerve root encroachment. No other focal fluid collections are seen. There is anterior epidural enhancement at the L3 and L4 levels. Enhancement extends into the psoas musculature bilaterally. Probable slow flow in the iliac veins without definite DVT. Probable reactive retroperitoneal lymph nodes. Disc levels: No significant disc space  findings from T11-12 through L2-3. L3-4: As above, of atypical findings of discitis, osteomyelitis and extensive paraspinal inflammation. There is anterior epidural enhancement and a small epidural abscess inferiorly in the left foramen, likely encroaching on the left L3 nerve root. These factors contribute to mass effect on the thecal sac and asymmetric narrowing of the left lateral recess. Marrow edema extends into the left L4 pedicle. No definite facet joint infection demonstrated. L4-5: Mild disc bulging, facet and ligamentous hypertrophy. No disc herniation, spinal stenosis or nerve root encroachment. L5-S1: There is disc bulging with a broad-based left foraminal disc protrusion. This may contribute to left L5 nerve root encroachment in the canal. Mild bilateral facet hypertrophy. IMPRESSION: 1. MRI confirms the impression of atypical discitis at L3-4 with associated osteomyelitis and extensive paraspinal inflammation. There is a small epidural abscess within the left foramen and diffuse anterior epidural enhancement. 2. No other foci of infection demonstrated. 3. Disc bulging, facet ligamentous hypertrophy at L4-5 without resulting spinal stenosis or nerve root encroachment. 4. Broad-based left foraminal disc protrusion at L5-S1 may contribute to left L5  nerve root encroachment. Electronically Signed   By: Richardean Sale M.D.   On: 08/16/2017 10:53   Ct Abdomen Pelvis W Contrast  Result Date: 08/16/2017 CLINICAL DATA:  Abdominal and severe low back pain. Abdominal distention. No bowel movement for 2 weeks. Attempted cells enema today by a lotion bottle. EXAM: CT ABDOMEN AND PELVIS WITH CONTRAST TECHNIQUE: Multidetector CT imaging of the abdomen and pelvis was performed using the standard protocol following bolus administration of intravenous contrast. CONTRAST:  158m ISOVUE-300 IOPAMIDOL (ISOVUE-300) INJECTION 61% COMPARISON:  None. FINDINGS: Lower chest: The lung bases are clear. Hepatobiliary: No focal  liver abnormality is seen. No gallstones, gallbladder wall thickening, or biliary dilatation. Pancreas: No ductal dilatation or inflammation. Spleen: Normal in size without focal abnormality. Adrenals/Urinary Tract: Normal adrenal glands. Mild motion artifact through the kidneys. Homogeneous enhancement with symmetric excretion on delayed phase imaging. Stomach/Bowel: Stomach is nondistended. No small bowel inflammation or obstruction. Fecalization of distal small bowel contents. Moderate large volume of stool throughout the entire colon, the distal most sigmoid colon is decompressed. No colonic wall thickening. No obstructing mass. Normal appendix. Vascular/Lymphatic: Aortic atherosclerosis without aneurysm. There are small retroperitoneal lymph nodes, for example aortocaval node measures 9 mm image 35 series 2. Additional small retroperitoneal nodes. Reproductive: Prostate is unremarkable. Other: Retroperitoneal edema and soft tissue stranding, most prominent in the region of L3-L4. No free air, free fluid, or intra-abdominal fluid collection. Musculoskeletal: Abnormal soft tissue density in the region of L3-L4 disc with adjacent retroperitoneal edema. Semi density extends into bilateral psoas muscles without dominant fluid collection. Question of abnormal epidural density L3-L4 level. There are early endplate changes involving superior L4 and inferior L3 suspicious for osteomyelitis. IMPRESSION: 1. Findings suspicious for L3-L4 diskitis osteomyelitis with possible epidural component. Recommend lumbar spine MRI, with contrast if no contraindications. 2. Large colonic stool burden and fecalization of distal small bowel contents consistent with constipation and slow transit. 3.  Aortic Atherosclerosis (ICD10-I70.0). These results were called by telephone at the time of interpretation on 08/16/2017 at 12:38 am to Dr. CMarda Stalker, who verbally acknowledged these results. Electronically Signed   By: MJeb LeveringM.D.   On: 08/16/2017 00:39   Dg Knee Complete 4 Views Left  Result Date: 07/28/2017 CLINICAL DATA:  Acute onset of left knee pain after falling off ladder 1 week ago. Initial encounter. EXAM: LEFT KNEE - COMPLETE 4+ VIEW COMPARISON:  None. FINDINGS: There is no evidence of fracture or dislocation. The joint spaces are preserved. No significant degenerative change is seen; the patellofemoral joint is grossly unremarkable in appearance. No significant joint effusion is seen. The visualized soft tissues are normal in appearance. IMPRESSION: No evidence of fracture or dislocation Electronically Signed   By: JGarald BaldingM.D.   On: 07/28/2017 21:26   Dg Hip Unilat With Pelvis 2-3 Views Left  Result Date: 07/28/2017 CLINICAL DATA:  Acute onset of left hip pain after falling off ladder. Initial encounter. EXAM: DG HIP (WITH OR WITHOUT PELVIS) 2-3V LEFT COMPARISON:  None. FINDINGS: There is no evidence of fracture or dislocation. Both femoral heads are seated normally within their respective acetabula. The proximal left femur appears intact. No significant degenerative change is appreciated. The sacroiliac joints are unremarkable in appearance. The visualized bowel gas pattern is grossly unremarkable in appearance. IMPRESSION: No evidence of fracture or dislocation. Electronically Signed   By: JGarald BaldingM.D.   On: 07/28/2017 21:25   Ir Lumbar Disc Aspiration W/img Guide  Result Date: 08/18/2017  INDICATION: Severe low back pain.  Discitis osteomyelitis at L3-L4. EXAM: FLUOROSCOPIC GUIDED DISC ASPIRATION MEDICATIONS: The patient is currently admitted to the hospital and receiving intravenous antibiotics. The antibiotics were administered within an appropriate time frame prior to the initiation of the procedure. ANESTHESIA/SEDATION: Fentanyl 2 mcg IV; Versed 50 mg IV.  Dilaudid 3 mg IV. Moderate Sedation Time:  20 minutes. The patient was continuously monitored during the procedure by the  interventional radiology nurse under my direct supervision. COMPLICATIONS: None immediate. PROCEDURE: Informed written consent was obtained from the patient after a thorough discussion of the procedural risks, benefits and alternatives. All questions were addressed. Maximal Sterile Barrier Technique was utilized including caps, mask, sterile gowns, sterile gloves, sterile drape, hand hygiene and skin antiseptic. A timeout was performed prior to the initiation of the procedure. The patient was laid prone on the fluoroscopic table. The skin overlying the lumbar region was then prepped and draped in the usual sterile fashion. The right L3-L4 disc space was then identified in a posterolateral view. The skin overlying this was then infiltrated with 0.25% bupivacaine. Using biplane intermittent fluoroscopy, a 21 gauge Franseen biopsy needle was then advanced into the L3-L4 disc space. Using a 20 mL syringe, aspiration was then performed. A second pass was made with a new sterile Franseen needle into a different location at L3-L4. Again using a 20 mL syringe, aspiration was obtained. The total amount of aspirate was minimal. This was sent for microbiological analysis, however. Hemostasis was achieved at the skin entry site. The patient tolerated the procedure well. IMPRESSION: Status post fluoroscopic guided disc aspiration at L3-L4 with 2 passes with a 21 gauge Franseen needle. Electronically Signed   By: Luanne Bras M.D.   On: 08/17/2017 18:36     Subjective: Pt having some back pain but overall getting better.    Discharge Exam: Vitals:   08/21/17 2122 08/22/17 0531  BP: 121/62 (!) 112/59  Pulse: 65 60  Resp:    Temp: 98.8 F (37.1 C) 98.5 F (36.9 C)  SpO2: 97% 97%   Vitals:   08/20/17 2048 08/21/17 0526 08/21/17 2122 08/22/17 0531  BP: 116/68 (!) 117/59 121/62 (!) 112/59  Pulse: 73 64 65 60  Resp:      Temp: 98.7 F (37.1 C) 98.8 F (37.1 C) 98.8 F (37.1 C) 98.5 F (36.9 C)  TempSrc:  Oral Oral Oral Oral  SpO2: 98% 98% 97% 97%  Weight:      Height:       General: Pt is alert, awake, not in acute distress Cardiovascular: RRR, S1/S2 +, no rubs, no gallops Respiratory: CTA bilaterally, no wheezing, no rhonchi Abdominal: Soft, NT, ND, bowel sounds + Extremities: no edema, no cyanosis   The results of significant diagnostics from this hospitalization (including imaging, microbiology, ancillary and laboratory) are listed below for reference.     Microbiology: Recent Results (from the past 240 hour(s))  Blood Culture (routine x 2)     Status: None   Collection Time: 08/16/17  1:35 AM  Result Value Ref Range Status   Specimen Description BLOOD RIGHT ARM  Final   Special Requests   Final    BOTTLES DRAWN AEROBIC AND ANAEROBIC Blood Culture results may not be optimal due to an excessive volume of blood received in culture bottles   Culture   Final    NO GROWTH 5 DAYS Performed at [facility], [place] [address] [place], [place] [address]    Report Status 08/21/2017  FINAL  Final  Blood Culture (routine x 2)     Status: None   Collection Time: 08/16/17  1:40 AM  Result Value Ref Range Status   Specimen Description BLOOD RIGHT ARM  Final   Special Requests IN PEDIATRIC BOTTLE Blood Culture adequate volume  Final   Culture   Final    NO GROWTH 5 DAYS Performed at [facility], [place] [address], [place], [place] [address]    Report Status 08/21/2017 FINAL  Final  MRSA PCR Screening     Status: None   Collection Time: 08/16/17 12:09 PM  Result Value Ref Range Status   MRSA by PCR NEGATIVE NEGATIVE Final    Comment:        The GeneXpert MRSA Assay (FDA approved for NASAL specimens only), is one component of a comprehensive MRSA colonization surveillance program. It is not intended to diagnose MRSA infection nor to guide or monitor treatment for MRSA infections.   Body fluid culture     Status: None   Collection Time: 08/17/17  5:06 PM  Result Value Ref  Range Status   Specimen Description FLUID  Final   Special Requests DISC L3 L4  Final   Gram Stain   Final    RARE WBC PRESENT,BOTH PMN AND MONONUCLEAR NO ORGANISMS SEEN    Culture   Final    MODERATE STAPHYLOCOCCUS AUREUS CRITICAL RESULT CALLED TO, READ BACK BY AND VERIFIED WITH: H. MACINTOSH RN, AT 1041 08/18/17 BY D. VANHOOK REGARDING CULTURE GROWTH    Report Status 08/19/2017 FINAL  Final   Organism ID, Bacteria STAPHYLOCOCCUS AUREUS  Final      Susceptibility   Staphylococcus aureus - MIC*    CIPROFLOXACIN <=0.5 SENSITIVE Sensitive     ERYTHROMYCIN >=8 RESISTANT Resistant     GENTAMICIN <=0.5 SENSITIVE Sensitive     OXACILLIN 0.5 SENSITIVE Sensitive     TETRACYCLINE <=1 SENSITIVE Sensitive     VANCOMYCIN <=0.5 SENSITIVE Sensitive     TRIMETH/SULFA <=10 SENSITIVE Sensitive     CLINDAMYCIN <=0.25 SENSITIVE Sensitive     RIFAMPIN <=0.5 SENSITIVE Sensitive     Inducible Clindamycin NEGATIVE Sensitive     * MODERATE STAPHYLOCOCCUS AUREUS  Culture, fungus without smear     Status: None (Preliminary result)   Collection Time: 08/17/17  5:06 PM  Result Value Ref Range Status   Specimen Description FLUID  Final   Special Requests DISC L3 L4  Final   Culture NO FUNGUS ISOLATED AFTER 3 DAYS  Final   Report Status PENDING  Incomplete  Acid Fast Smear (AFB)     Status: None   Collection Time: 08/17/17  5:06 PM  Result Value Ref Range Status   AFB Specimen Processing Concentration  Final   Acid Fast Smear Negative  Final    Comment: (NOTE) Performed At: [facility] [place], [place] [id number] Lindon Romp MD YK:[id number]    Source (AFB) FLUID  Final    Comment: DISC L3 L4     Labs: BNP (last 3 results) No results for input(s): BNP in the last 8760 hours. Basic Metabolic Panel:  Recent Labs Lab 08/15/17 2257 08/16/17 1344 08/18/17 0618  NA 133*  --  132*  K 5.1  --  4.3  CL 95*  --  99*  CO2 27  --  23  GLUCOSE 120*  --  82  BUN 26*  --   13  CREATININE 0.79 0.84 0.75  CALCIUM 9.5  --  8.8*   Liver Function Tests:  Recent Labs Lab 08/15/17 2257 08/18/17 0618  AST 82* 77*  ALT 106* 90*  ALKPHOS 246* 190*  BILITOT 0.7 0.7  PROT 8.8* 7.0  ALBUMIN 3.7 3.0*    Recent Labs Lab 08/15/17 2257  LIPASE 19   No results for input(s): AMMONIA in the last 168 hours. CBC:  Recent Labs Lab 08/15/17 2257 08/16/17 1344 08/18/17 0618  WBC 13.2* 8.6 9.2  NEUTROABS 10.1*  --   --   HGB 13.0 11.8* 11.2*  HCT 39.4 37.1* 33.7*  MCV 84.0 85.3 84.0  PLT 488* 445* 372   Cardiac Enzymes: No results for input(s): CKTOTAL, CKMB, CKMBINDEX, TROPONINI in the last 168 hours. BNP: Invalid input(s): POCBNP CBG:  Recent Labs Lab 08/20/17 2134 08/21/17 0604 08/21/17 1107 08/21/17 1731 08/22/17 0641  GLUCAP 189* 105* 97 177* 81   D-Dimer No results for input(s): DDIMER in the last 72 hours. Hgb A1c No results for input(s): HGBA1C in the last 72 hours. Lipid Profile No results for input(s): CHOL, HDL, LDLCALC, TRIG, CHOLHDL, LDLDIRECT in the last 72 hours. Thyroid function studies No results for input(s): TSH, T4TOTAL, T3FREE, THYROIDAB in the last 72 hours.  Invalid input(s): FREET3 Anemia work up No results for input(s): VITAMINB12, FOLATE, FERRITIN, TIBC, IRON, RETICCTPCT in the last 72 hours. Urinalysis    Component Value Date/Time   COLORURINE YELLOW 12/13/2016 2310   APPEARANCEUR HAZY (A) 12/13/2016 2310   LABSPEC 1.034 (H) 12/13/2016 2310   PHURINE 5.0 12/13/2016 2310   GLUCOSEU NEGATIVE 12/13/2016 2310   HGBUR SMALL (A) 12/13/2016 2310   BILIRUBINUR NEGATIVE 12/13/2016 2310   KETONESUR NEGATIVE 12/13/2016 2310   PROTEINUR 100 (A) 12/13/2016 2310   NITRITE NEGATIVE 12/13/2016 2310   LEUKOCYTESUR NEGATIVE 12/13/2016 2310   Sepsis Labs Invalid input(s): PROCALCITONIN,  WBC,  LACTICIDVEN Microbiology Recent Results (from the past 240 hour(s))  Blood Culture (routine x 2)     Status: None   Collection  Time: 08/16/17  1:35 AM  Result Value Ref Range Status   Specimen Description BLOOD RIGHT ARM  Final   Special Requests   Final    BOTTLES DRAWN AEROBIC AND ANAEROBIC Blood Culture results may not be optimal due to an excessive volume of blood received in culture bottles   Culture   Final    NO GROWTH 5 DAYS Performed at [facility], [place] [address] [place], [place] [address]    Report Status 08/21/2017 FINAL  Final  Blood Culture (routine x 2)     Status: None   Collection Time: 08/16/17  1:40 AM  Result Value Ref Range Status   Specimen Description BLOOD RIGHT ARM  Final   Special Requests IN PEDIATRIC BOTTLE Blood Culture adequate volume  Final   Culture   Final    NO GROWTH 5 DAYS Performed at [facility], [place] [address], [place], [place] [address]    Report Status 08/21/2017 FINAL  Final  MRSA PCR Screening     Status: None   Collection Time: 08/16/17 12:09 PM  Result Value Ref Range Status   MRSA by PCR NEGATIVE NEGATIVE Final    Comment:        The GeneXpert MRSA Assay (FDA approved for NASAL specimens only), is one component of a comprehensive MRSA colonization surveillance program. It is not intended to diagnose MRSA infection nor to guide or monitor treatment for MRSA infections.   Body fluid culture  Status: None   Collection Time: 08/17/17  5:06 PM  Result Value Ref Range Status   Specimen Description FLUID  Final   Special Requests DISC L3 L4  Final   Gram Stain   Final    RARE WBC PRESENT,BOTH PMN AND MONONUCLEAR NO ORGANISMS SEEN    Culture   Final    MODERATE STAPHYLOCOCCUS AUREUS CRITICAL RESULT CALLED TO, READ BACK BY AND VERIFIED WITH: H. MACINTOSH RN, AT 1041 08/18/17 BY D. VANHOOK REGARDING CULTURE GROWTH    Report Status 08/19/2017 FINAL  Final   Organism ID, Bacteria STAPHYLOCOCCUS AUREUS  Final      Susceptibility   Staphylococcus aureus - MIC*    CIPROFLOXACIN <=0.5 SENSITIVE Sensitive     ERYTHROMYCIN >=8 RESISTANT  Resistant     GENTAMICIN <=0.5 SENSITIVE Sensitive     OXACILLIN 0.5 SENSITIVE Sensitive     TETRACYCLINE <=1 SENSITIVE Sensitive     VANCOMYCIN <=0.5 SENSITIVE Sensitive     TRIMETH/SULFA <=10 SENSITIVE Sensitive     CLINDAMYCIN <=0.25 SENSITIVE Sensitive     RIFAMPIN <=0.5 SENSITIVE Sensitive     Inducible Clindamycin NEGATIVE Sensitive     * MODERATE STAPHYLOCOCCUS AUREUS  Culture, fungus without smear     Status: None (Preliminary result)   Collection Time: 08/17/17  5:06 PM  Result Value Ref Range Status   Specimen Description FLUID  Final   Special Requests DISC L3 L4  Final   Culture NO FUNGUS ISOLATED AFTER 3 DAYS  Final   Report Status PENDING  Incomplete  Acid Fast Smear (AFB)     Status: None   Collection Time: 08/17/17  5:06 PM  Result Value Ref Range Status   AFB Specimen Processing Concentration  Final   Acid Fast Smear Negative  Final    Comment: (NOTE) Performed At: [facility] [place], [place] [id number] Lindon Romp MD WF:[id number]    Source (AFB) FLUID  Final    Comment: DISC L3 L4   Time coordinating discharge: 35 mins  SIGNED:  Irwin Brakeman, MD  [facility] 08/22/2017, 9:47 AM Pager [phone]  If 7PM-7AM, please contact night-coverage [email or web address] Password [email or web address]

## 2017-08-21 NOTE — Progress Notes (Signed)
PHARMACY CONSULT NOTE FOR:  OUTPATIENT  PARENTERAL ANTIBIOTIC THERAPY (OPAT)  Indication: MSSA lumbar discitis Regimen: Cefazolin 2g IV every 8 hours End date: 09/29/17  IV antibiotic discharge orders are pended. To discharging provider:  please sign these orders via discharge navigator,  Select New Orders & click on the button choice - Manage This Unsigned Work.     Thank you for allowing pharmacy to be a part of this patient's care.  Link SnufferJessica Ginamarie Banfield, PharmD, BCPS Clinical Pharmacist Clinical phone 08/21/2017 until 3:30PM [phone]- #[phone] After hours, please call [phone] 08/21/2017, 12:43 PM

## 2017-08-21 NOTE — Discharge Instructions (Signed)
Follow with Primary MD  Jesus Watkins, Jesus Ann, NP  and other consultant's as instructed your Hospitalist MD ° °Please get a complete blood count and chemistry panel checked by your Primary MD at your next visit, and again as instructed by your Primary MD. ° °Get Medicines reviewed and adjusted: °Please take all your medications with you for your next visit with your Primary MD ° °Laboratory/radiological data: °Please request your Primary MD to go over all hospital tests and procedure/radiological results at the follow up, please ask your Primary MD to get all Hospital records sent to his/her office. ° °In some cases, they will be blood work, cultures and biopsy results pending at the time of your discharge. Please request that your primary care M.D. follows up on these results. ° °Also Note the following: °If you experience worsening of your admission symptoms, develop shortness of breath, life threatening emergency, suicidal or homicidal thoughts you must seek medical attention immediately by calling 911 or calling your MD immediately  if symptoms less severe. ° °You must read complete instructions/literature along with all the possible adverse reactions/side effects for all the Medicines you take and that have been prescribed to you. Take any new Medicines after you have completely understood and accpet all the possible adverse reactions/side effects.  ° °Do not drive when taking Pain medications or sleeping medications (Benzodaizepines) ° °Do not take more than prescribed Pain, Sleep and Anxiety Medications. It is not advisable to combine anxiety,sleep and pain medications without talking with your primary care practitioner ° °Special Instructions: If you have smoked or chewed Tobacco  in the last 2 yrs please stop smoking, stop any regular Alcohol  and or any Recreational drug use. ° °Wear Seat belts while driving. ° °Please note: °You were cared for by a hospitalist during your hospital stay. Once you are  discharged, your primary care physician will handle any further medical issues. Please note that NO REFILLS for any discharge medications will be authorized once you are discharged, as it is imperative that you return to your primary care physician (or establish a relationship with a primary care physician if you do not have one) for your post hospital discharge needs so that they can reassess your need for medications and monitor your lab values. ° ° ° ° °

## 2017-08-21 NOTE — Social Work (Signed)
CSW confirmed Saturday admission with Stanton Kidneyebra at [facility]. She indicated that DC summary must be received before 11am or patient will not be able to come to SNF as pharmacy will not be available after 11 am. DC summary must be faxed to [phone] before 11am. Please call and verify with [address] Nurse to confirm receipt.   SW set up transport with [facility] 10:30am on Saturday, 08/22/17.  If there are any issues, Please call Stanton KidneyDebra in admission on her personal cell#[phone].  Keene BreathPatricia Maizee Reinhold, LCSW Clinical Social Worker [phone]

## 2017-08-22 LAB — GLUCOSE, CAPILLARY: GLUCOSE-CAPILLARY: 81 mg/dL (ref 65–99)

## 2017-08-22 MED ORDER — OXYCODONE HCL ER 10 MG PO T12A: 10.0000 mg | EXTENDED_RELEASE_TABLET | Freq: Two times a day (BID) | ORAL | 0 refills | Status: AC

## 2017-08-22 NOTE — Clinical Social Work Note (Signed)
Clinical Social Worker facilitated patient discharge including contacting patient family and facility to confirm patient discharge plans.  Clinical information faxed to facility and family agreeable with plan.  CSW arranged transport through [facility].  RN to call report prior to discharge [phone]).  Clinical Social Worker will sign off for now as social work intervention is no longer needed. Please consult us again if new need arises.  Macario GoldsJesse Jamez Ambrocio, [place]CS[place] [phone]

## 2017-09-07 LAB — CULTURE, FUNGUS WITHOUT SMEAR

## 2017-09-22 ENCOUNTER — Ambulatory Visit: Payer: Self-pay | Admitting: Internal Medicine

## 2017-09-30 LAB — ACID FAST CULTURE WITH REFLEXED SENSITIVITIES

## 2017-09-30 LAB — ACID FAST CULTURE WITH REFLEXED SENSITIVITIES (MYCOBACTERIA): Acid Fast Culture: NEGATIVE

## 2019-01-03 IMAGING — DX DG KNEE COMPLETE 4+V*L*
4 series · 4 of 4 positions shown · non-contrast
Comparison: None.

CLINICAL DATA: Acute onset of left knee pain after falling off
ladder 1 week ago. Initial encounter.

EXAM:
LEFT KNEE - COMPLETE 4+ VIEW

[knee ap]
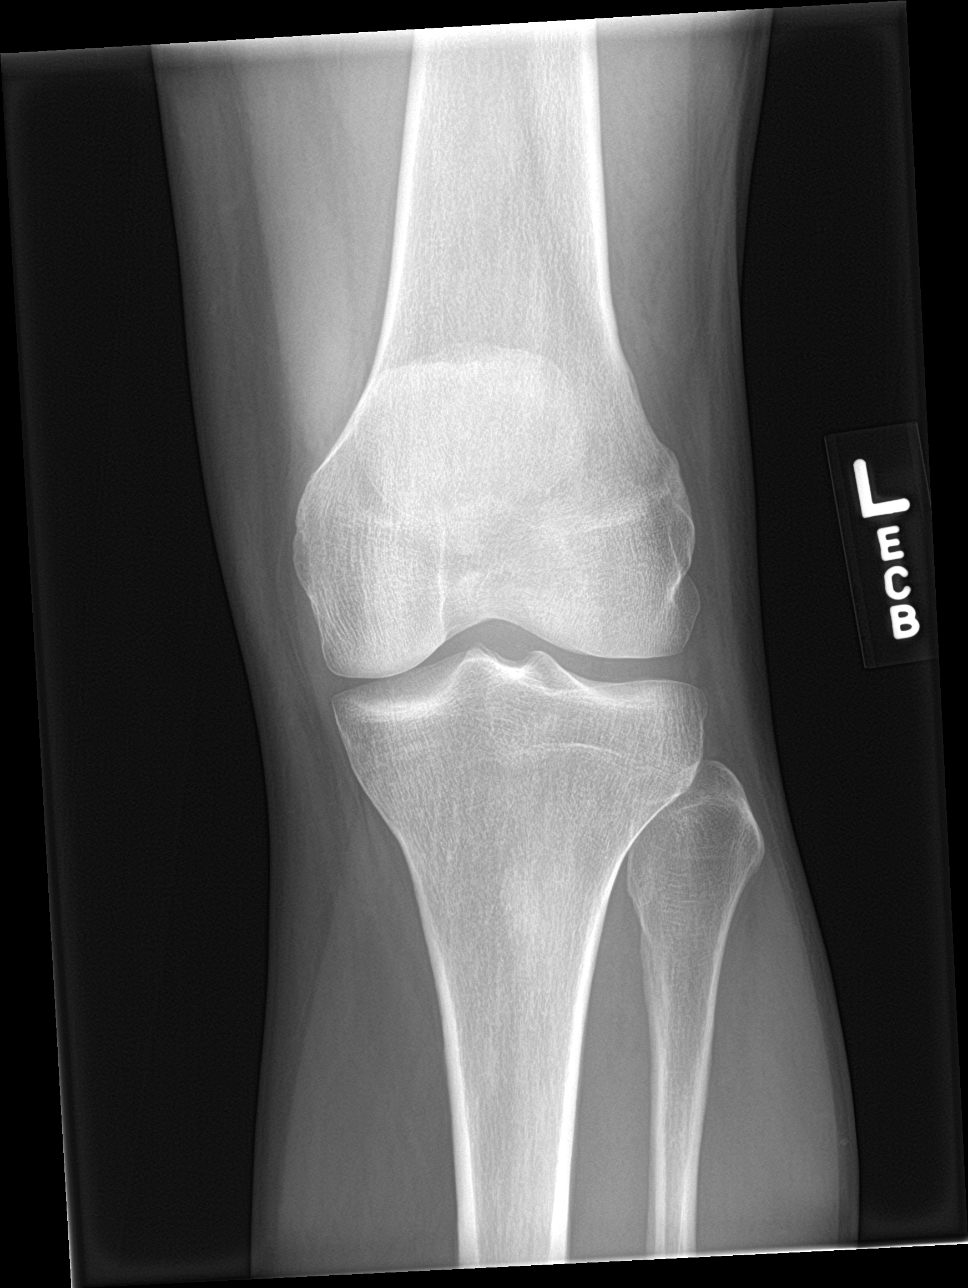

[knee lat]
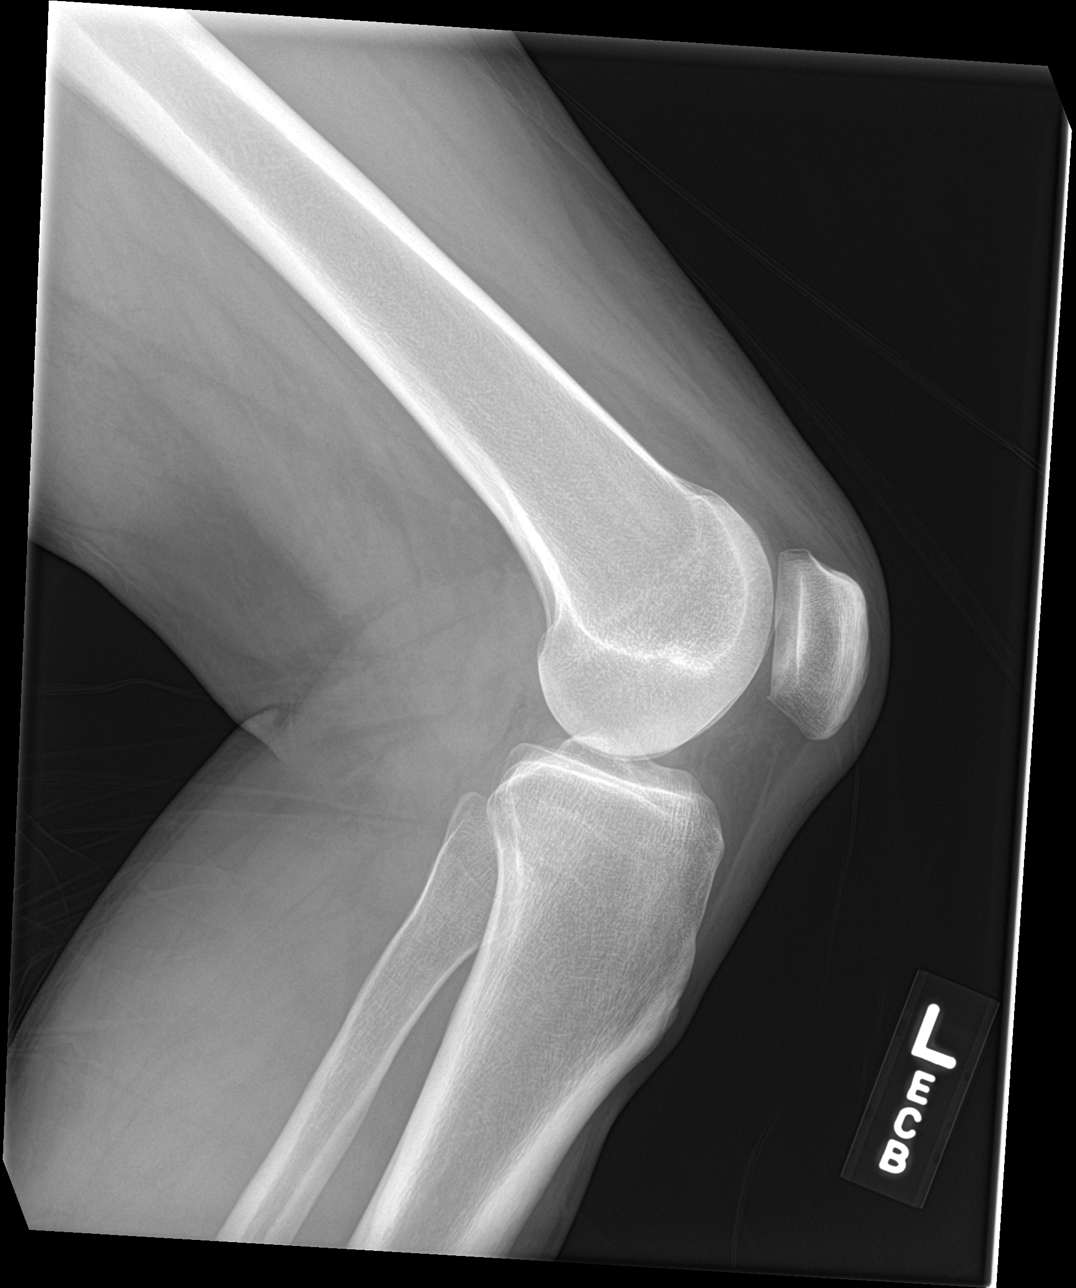

[knee obl (1 of 2)]
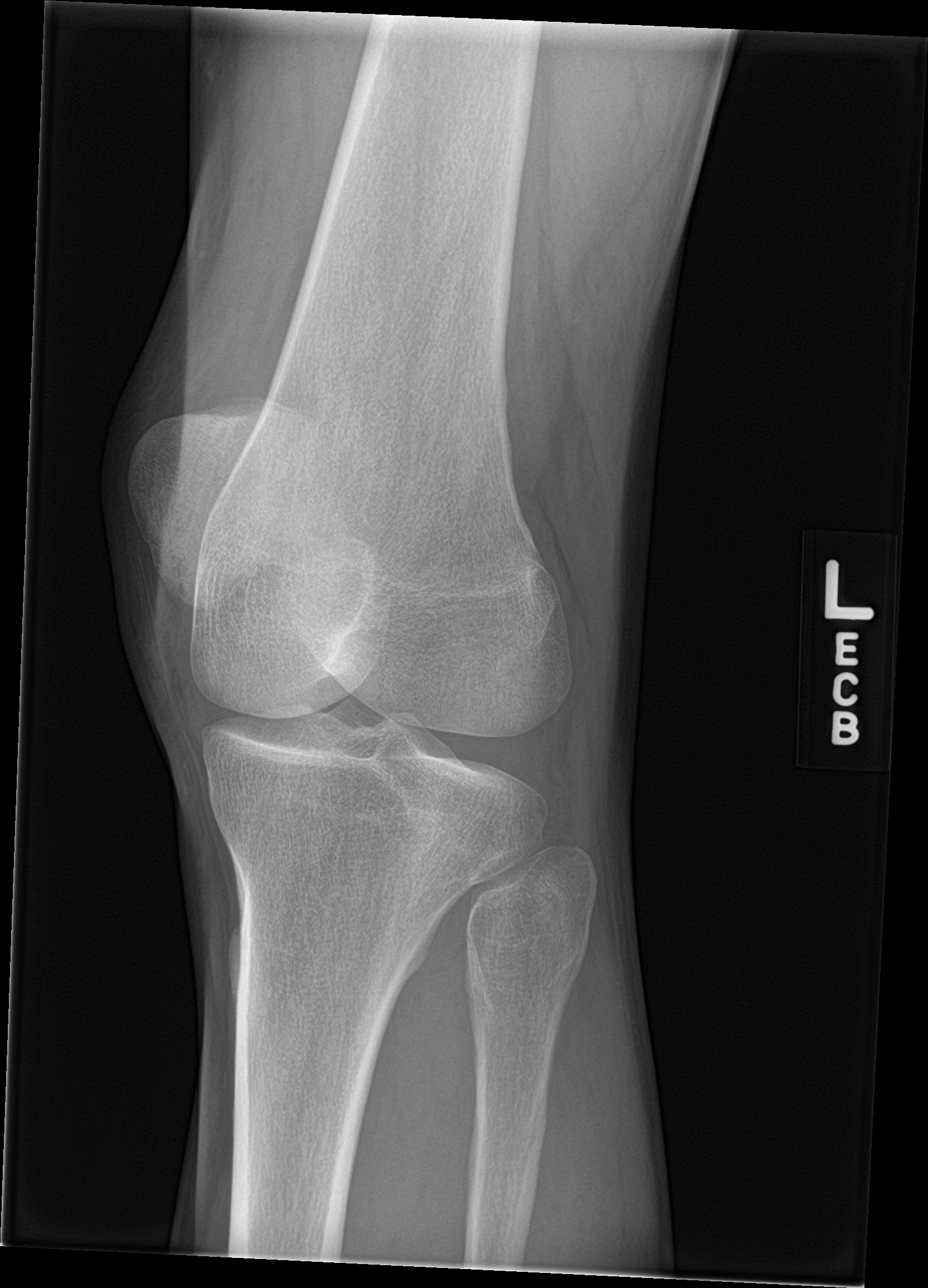

[knee obl (2 of 2)]
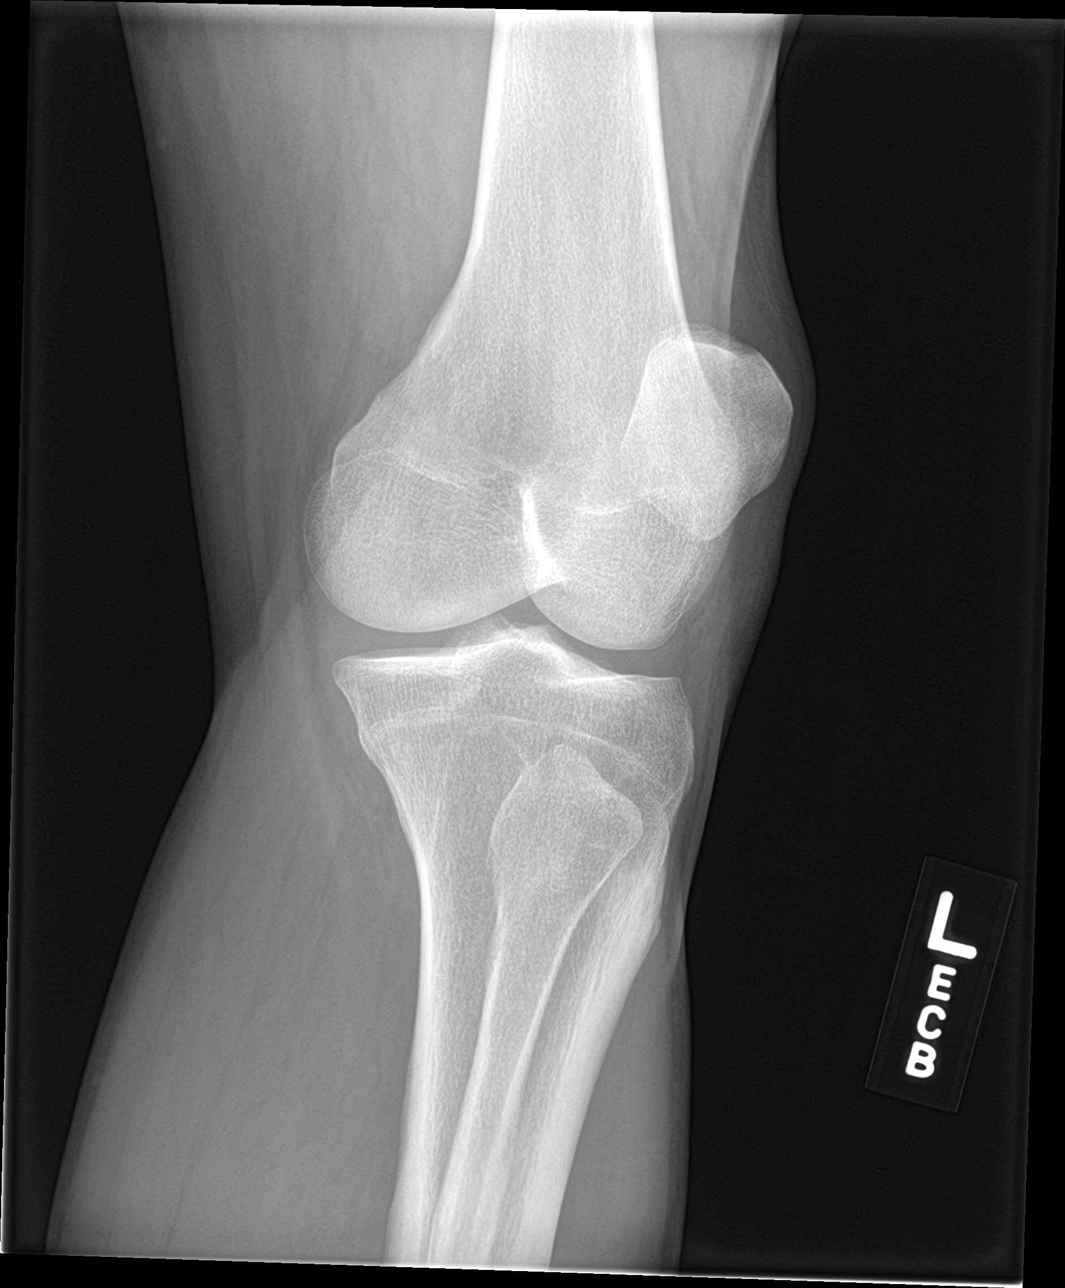

[4 of 4 positions shown; findings below may reference images not displayed]

FINDINGS: There is no evidence of fracture or dislocation. The joint spaces
are preserved. No significant degenerative change is seen; the
patellofemoral joint is grossly unremarkable in appearance.

No significant joint effusion is seen. The visualized soft tissues
are normal in appearance.
IMPRESSION: No evidence of fracture or dislocation

## 2019-01-23 IMAGING — CR DG ABDOMEN 1V
1 series · 1 of 1 positions shown · non-contrast
Comparison: Abdominal and pelvic CT scan August 16, 2017

CLINICAL DATA: Back pain, constipation despite having multiple
bowel movements yesterday.

EXAM:
ABDOMEN - 1 VIEW

[abdomen kub]
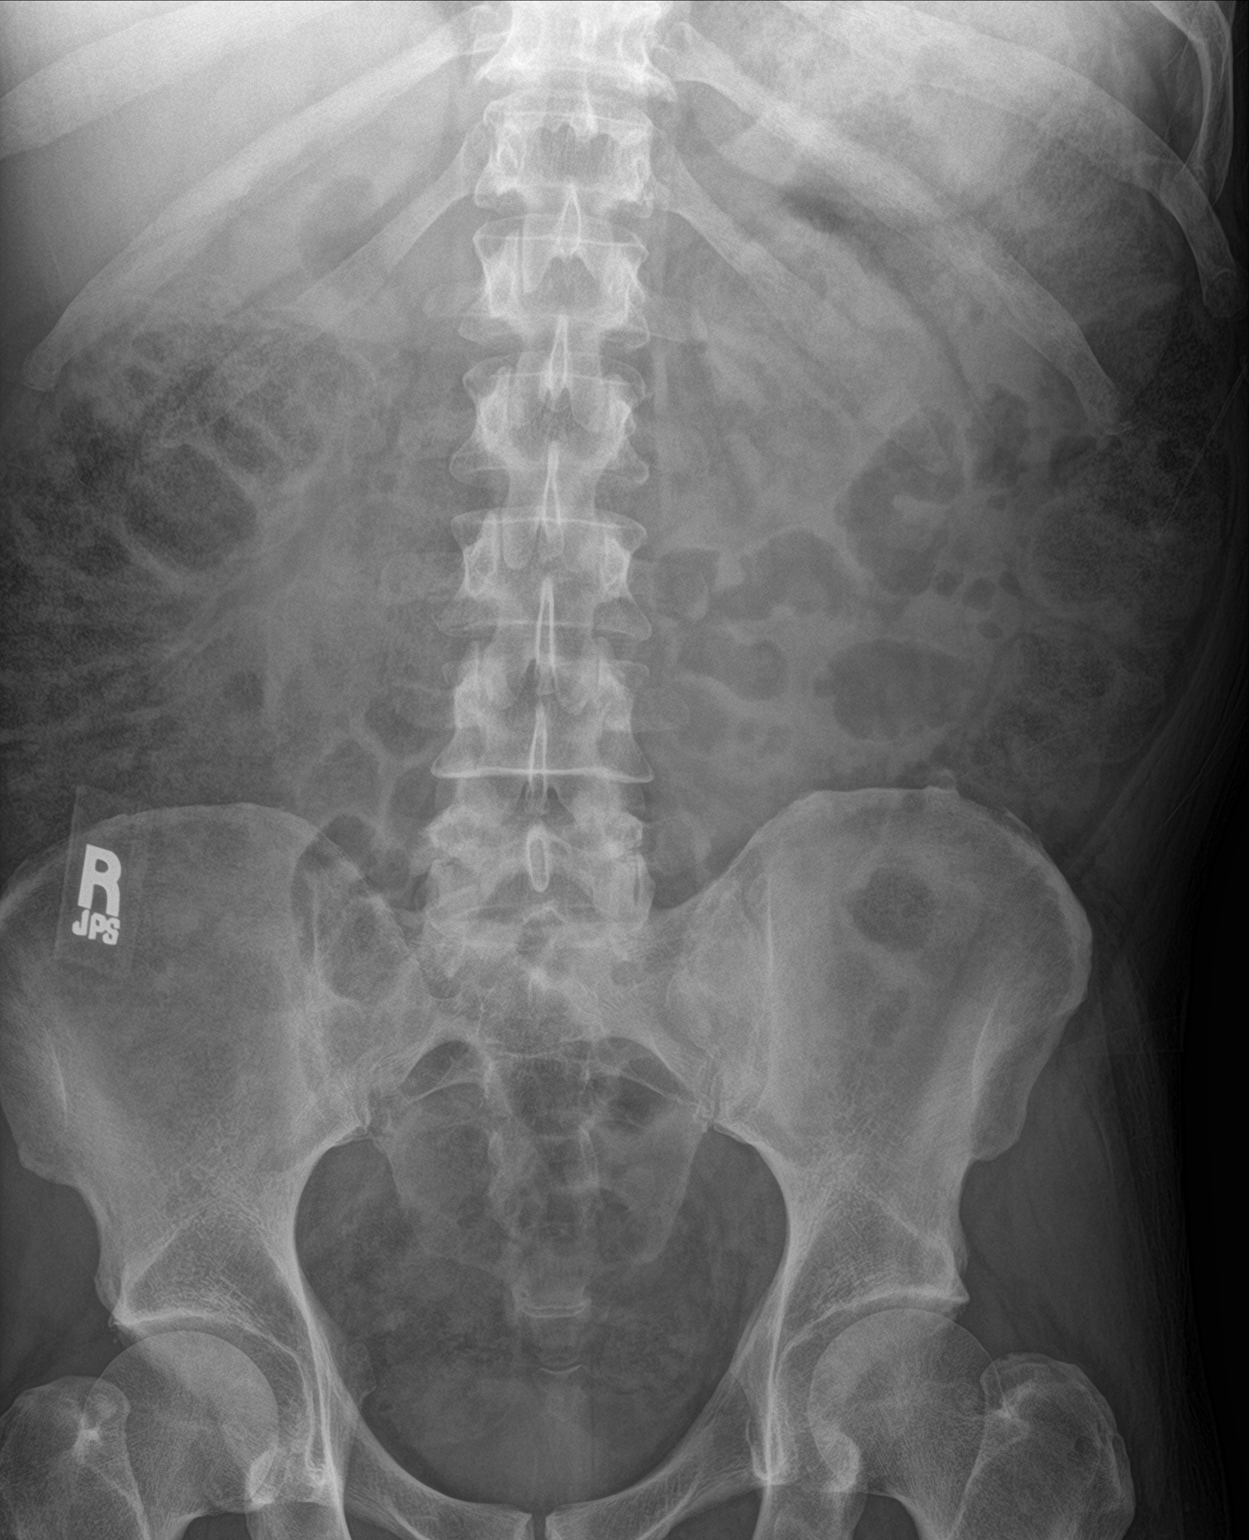

[1 of 1 positions shown; findings below may reference images not displayed]

FINDINGS: The colonic stool burden remains increased. There is no evidence of
a small bowel obstruction or fecal impaction. No abnormal soft
tissue calcifications are observed. The bony structures are
unremarkable.
IMPRESSION: Increased colonic stool burden compatible with constipation. No
evidence of obstruction or fecal impaction.
